# Patient Record
Sex: Female | Born: 1955 | Race: White | Hispanic: No | Marital: Married | Smoking: Former smoker
Health system: Southern US, Community
[De-identification: ages and names within clinical notes are randomized; demographics above are authoritative.]

## PROBLEM LIST (undated history)

## (undated) DIAGNOSIS — F419 Anxiety disorder, unspecified: Secondary | ICD-10-CM

## (undated) DIAGNOSIS — G4730 Sleep apnea, unspecified: Secondary | ICD-10-CM

## (undated) DIAGNOSIS — F329 Major depressive disorder, single episode, unspecified: Secondary | ICD-10-CM

## (undated) DIAGNOSIS — N83209 Unspecified ovarian cyst, unspecified side: Secondary | ICD-10-CM

## (undated) DIAGNOSIS — R079 Chest pain, unspecified: Secondary | ICD-10-CM

## (undated) DIAGNOSIS — I499 Cardiac arrhythmia, unspecified: Secondary | ICD-10-CM

## (undated) DIAGNOSIS — Z87442 Personal history of urinary calculi: Secondary | ICD-10-CM

## (undated) DIAGNOSIS — N200 Calculus of kidney: Secondary | ICD-10-CM

## (undated) DIAGNOSIS — M1990 Unspecified osteoarthritis, unspecified site: Secondary | ICD-10-CM

## (undated) DIAGNOSIS — K5792 Diverticulitis of intestine, part unspecified, without perforation or abscess without bleeding: Secondary | ICD-10-CM

## (undated) DIAGNOSIS — D649 Anemia, unspecified: Secondary | ICD-10-CM

## (undated) DIAGNOSIS — I209 Angina pectoris, unspecified: Secondary | ICD-10-CM

## (undated) DIAGNOSIS — N390 Urinary tract infection, site not specified: Secondary | ICD-10-CM

## (undated) DIAGNOSIS — F32A Depression, unspecified: Secondary | ICD-10-CM

## (undated) DIAGNOSIS — R7303 Prediabetes: Secondary | ICD-10-CM

## (undated) DIAGNOSIS — K219 Gastro-esophageal reflux disease without esophagitis: Secondary | ICD-10-CM

## (undated) DIAGNOSIS — K5790 Diverticulosis of intestine, part unspecified, without perforation or abscess without bleeding: Secondary | ICD-10-CM

## (undated) DIAGNOSIS — Z8719 Personal history of other diseases of the digestive system: Secondary | ICD-10-CM

## (undated) HISTORY — PX: MOUTH SURGERY: SHX715

## (undated) HISTORY — DX: Diverticulosis of intestine, part unspecified, without perforation or abscess without bleeding: K57.90

## (undated) HISTORY — DX: Personal history of other diseases of the digestive system: Z87.19

## (undated) HISTORY — DX: Gastro-esophageal reflux disease without esophagitis: K21.9

## (undated) HISTORY — DX: Chest pain, unspecified: R07.9

## (undated) HISTORY — DX: Calculus of kidney: N20.0

## (undated) HISTORY — PX: COLONOSCOPY: SHX174

## (undated) HISTORY — DX: Diverticulitis of intestine, part unspecified, without perforation or abscess without bleeding: K57.92

## (undated) HISTORY — DX: Anxiety disorder, unspecified: F41.9

## (undated) HISTORY — DX: Major depressive disorder, single episode, unspecified: F32.9

## (undated) HISTORY — DX: Unspecified osteoarthritis, unspecified site: M19.90

## (undated) HISTORY — DX: Depression, unspecified: F32.A

## (undated) HISTORY — DX: Unspecified ovarian cyst, unspecified side: N83.209

## (undated) HISTORY — DX: Prediabetes: R73.03

## (undated) HISTORY — DX: Anemia, unspecified: D64.9

## (undated) HISTORY — DX: Urinary tract infection, site not specified: N39.0

## (undated) HISTORY — PX: CATARACT EXTRACTION, BILATERAL: SHX1313

---

## 1995-05-01 HISTORY — PX: CARPAL TUNNEL RELEASE: SHX101

## 1998-12-20 ENCOUNTER — Ambulatory Visit: Admission: RE | Admit: 1998-12-20 | Discharge: 1998-12-20 | Payer: Self-pay | Admitting: Family Medicine

## 1998-12-20 ENCOUNTER — Encounter: Payer: Self-pay | Admitting: Family Medicine

## 1998-12-23 ENCOUNTER — Ambulatory Visit (HOSPITAL_COMMUNITY): Admission: RE | Admit: 1998-12-23 | Discharge: 1998-12-23 | Payer: Self-pay | Admitting: Internal Medicine

## 1998-12-23 ENCOUNTER — Encounter (INDEPENDENT_AMBULATORY_CARE_PROVIDER_SITE_OTHER): Payer: Self-pay | Admitting: Specialist

## 1999-04-28 ENCOUNTER — Other Ambulatory Visit: Admission: RE | Admit: 1999-04-28 | Discharge: 1999-04-28 | Payer: Self-pay | Admitting: Obstetrics and Gynecology

## 2000-06-21 ENCOUNTER — Ambulatory Visit (HOSPITAL_BASED_OUTPATIENT_CLINIC_OR_DEPARTMENT_OTHER): Admission: RE | Admit: 2000-06-21 | Discharge: 2000-06-21 | Payer: Self-pay | Admitting: Orthopedic Surgery

## 2003-02-23 ENCOUNTER — Ambulatory Visit (HOSPITAL_BASED_OUTPATIENT_CLINIC_OR_DEPARTMENT_OTHER): Admission: RE | Admit: 2003-02-23 | Discharge: 2003-02-23 | Payer: Self-pay

## 2003-06-25 ENCOUNTER — Emergency Department (HOSPITAL_COMMUNITY): Admission: EM | Admit: 2003-06-25 | Discharge: 2003-06-25 | Payer: Self-pay | Admitting: Emergency Medicine

## 2005-08-03 ENCOUNTER — Emergency Department (HOSPITAL_COMMUNITY): Admission: EM | Admit: 2005-08-03 | Discharge: 2005-08-03 | Payer: Self-pay | Admitting: Family Medicine

## 2009-09-07 ENCOUNTER — Ambulatory Visit: Payer: Self-pay | Admitting: Vascular Surgery

## 2009-11-01 ENCOUNTER — Emergency Department (HOSPITAL_COMMUNITY): Admission: EM | Admit: 2009-11-01 | Discharge: 2009-11-02 | Payer: Self-pay | Admitting: Emergency Medicine

## 2010-04-30 HISTORY — PX: TOTAL KNEE ARTHROPLASTY: SHX125

## 2010-05-01 ENCOUNTER — Inpatient Hospital Stay (HOSPITAL_COMMUNITY)
Admission: RE | Admit: 2010-05-01 | Discharge: 2010-05-05 | Payer: Self-pay | Source: Home / Self Care | Attending: Orthopedic Surgery | Admitting: Orthopedic Surgery

## 2010-05-03 LAB — BASIC METABOLIC PANEL
BUN: 4 mg/dL — ABNORMAL LOW (ref 6–23)
CO2: 28 mEq/L (ref 19–32)
Calcium: 8.6 mg/dL (ref 8.4–10.5)
Chloride: 101 mEq/L (ref 96–112)
Creatinine, Ser: 0.73 mg/dL (ref 0.4–1.2)
GFR calc Af Amer: >60 mL/min (ref >60)
GFR calc non Af Amer: >60 mL/min (ref >60)
Glucose, Bld: 128 mg/dL — ABNORMAL HIGH (ref 70–99)
Potassium: 4 mEq/L (ref 3.5–5.1)
Sodium: 134 mEq/L — ABNORMAL LOW (ref 135–145)

## 2010-05-03 LAB — CBC
HCT: 27.5 % — ABNORMAL LOW (ref 36.0–46.0)
Hemoglobin: 8.6 g/dL — ABNORMAL LOW (ref 12.0–15.0)
MCH: 24.5 pg — ABNORMAL LOW (ref 26.0–34.0)
MCHC: 31.3 g/dL (ref 30.0–36.0)
MCV: 78.3 fL (ref 78.0–100.0)
Platelets: 226 10*3/uL (ref 150–400)
RBC: 3.51 MIL/uL — ABNORMAL LOW (ref 3.87–5.11)
RDW: 17.5 % — ABNORMAL HIGH (ref 11.5–15.5)
WBC: 11.7 10*3/uL — ABNORMAL HIGH (ref 4.0–10.5)

## 2010-05-03 LAB — PROTIME-INR
INR: 1.21 (ref 0.00–1.49)
Prothrombin Time: 15.5 seconds — ABNORMAL HIGH (ref 11.6–15.2)

## 2010-05-03 LAB — PREPARE RBC (CROSSMATCH)

## 2010-05-04 LAB — CBC
HCT: 31.6 % — ABNORMAL LOW (ref 36.0–46.0)
Hemoglobin: 10 g/dL — ABNORMAL LOW (ref 12.0–15.0)
MCH: 25.4 pg — ABNORMAL LOW (ref 26.0–34.0)
MCHC: 31.6 g/dL (ref 30.0–36.0)
MCV: 80.4 fL (ref 78.0–100.0)
Platelets: 221 10*3/uL (ref 150–400)
RBC: 3.93 MIL/uL (ref 3.87–5.11)
RDW: 17.3 % — ABNORMAL HIGH (ref 11.5–15.5)
WBC: 10.9 10*3/uL — ABNORMAL HIGH (ref 4.0–10.5)

## 2010-05-04 LAB — BASIC METABOLIC PANEL
BUN: 5 mg/dL — ABNORMAL LOW (ref 6–23)
CO2: 29 mEq/L (ref 19–32)
Calcium: 8.5 mg/dL (ref 8.4–10.5)
Chloride: 98 mEq/L (ref 96–112)
Creatinine, Ser: 0.7 mg/dL (ref 0.4–1.2)
GFR calc Af Amer: >60 mL/min (ref >60)
GFR calc non Af Amer: >60 mL/min (ref >60)
Glucose, Bld: 117 mg/dL — ABNORMAL HIGH (ref 70–99)
Potassium: 3.7 mEq/L (ref 3.5–5.1)
Sodium: 133 mEq/L — ABNORMAL LOW (ref 135–145)

## 2010-05-04 LAB — PROTIME-INR
INR: 1.23 (ref 0.00–1.49)
Prothrombin Time: 15.7 seconds — ABNORMAL HIGH (ref 11.6–15.2)

## 2010-05-05 LAB — CBC
HCT: 31.2 % — ABNORMAL LOW (ref 36.0–46.0)
Hemoglobin: 9.7 g/dL — ABNORMAL LOW (ref 12.0–15.0)
MCH: 25.3 pg — ABNORMAL LOW (ref 26.0–34.0)
MCHC: 31.1 g/dL (ref 30.0–36.0)
MCV: 81.3 fL (ref 78.0–100.0)
Platelets: 245 10*3/uL (ref 150–400)
RBC: 3.84 MIL/uL — ABNORMAL LOW (ref 3.87–5.11)
RDW: 17.8 % — ABNORMAL HIGH (ref 11.5–15.5)
WBC: 8.2 10*3/uL (ref 4.0–10.5)

## 2010-05-05 LAB — BASIC METABOLIC PANEL
BUN: 6 mg/dL (ref 6–23)
CO2: 30 mEq/L (ref 19–32)
Calcium: 8.4 mg/dL (ref 8.4–10.5)
Chloride: 98 mEq/L (ref 96–112)
Creatinine, Ser: 0.63 mg/dL (ref 0.4–1.2)
GFR calc Af Amer: >60 mL/min (ref >60)
GFR calc non Af Amer: >60 mL/min (ref >60)
Glucose, Bld: 122 mg/dL — ABNORMAL HIGH (ref 70–99)
Potassium: 3.7 mEq/L (ref 3.5–5.1)
Sodium: 135 mEq/L (ref 135–145)

## 2010-05-05 LAB — PROTIME-INR
INR: 1.36 (ref 0.00–1.49)
Prothrombin Time: 17 seconds — ABNORMAL HIGH (ref 11.6–15.2)

## 2010-07-10 LAB — CBC
HCT: 36.5 % (ref 36.0–46.0)
Hemoglobin: 11 g/dL — ABNORMAL LOW (ref 12.0–15.0)
Hemoglobin: 9.1 g/dL — ABNORMAL LOW (ref 12.0–15.0)
MCH: 24 pg — ABNORMAL LOW (ref 26.0–34.0)
MCHC: 30.1 g/dL (ref 30.0–36.0)
MCV: 79.5 fL (ref 78.0–100.0)
Platelets: 318 10*3/uL (ref 150–400)
RBC: 3.81 MIL/uL — ABNORMAL LOW (ref 3.87–5.11)
RBC: 4.59 MIL/uL (ref 3.87–5.11)
RDW: 18.2 % — ABNORMAL HIGH (ref 11.5–15.5)
WBC: 7.5 10*3/uL (ref 4.0–10.5)
WBC: 7.7 10*3/uL (ref 4.0–10.5)

## 2010-07-10 LAB — COMPREHENSIVE METABOLIC PANEL
ALT: 13 U/L (ref 0–35)
AST: 16 U/L (ref 0–37)
Albumin: 3.4 g/dL — ABNORMAL LOW (ref 3.5–5.2)
Alkaline Phosphatase: 109 U/L (ref 39–117)
BUN: 16 mg/dL (ref 6–23)
CO2: 24 mEq/L (ref 19–32)
Calcium: 9.1 mg/dL (ref 8.4–10.5)
Chloride: 104 mEq/L (ref 96–112)
Creatinine, Ser: 0.66 mg/dL (ref 0.4–1.2)
GFR calc Af Amer: >60 mL/min (ref >60)
GFR calc non Af Amer: >60 mL/min (ref >60)
Glucose, Bld: 106 mg/dL — ABNORMAL HIGH (ref 70–99)
Potassium: 3.8 mEq/L (ref 3.5–5.1)
Sodium: 137 mEq/L (ref 135–145)
Total Bilirubin: 0.4 mg/dL (ref 0.3–1.2)
Total Protein: 6.7 g/dL (ref 6.0–8.3)

## 2010-07-10 LAB — TYPE AND SCREEN
ABO/RH(D): O POS
Antibody Screen: NEGATIVE
Unit division: 0
Unit division: 0

## 2010-07-10 LAB — BASIC METABOLIC PANEL
BUN: 9 mg/dL (ref 6–23)
CO2: 27 mEq/L (ref 19–32)
Calcium: 8.6 mg/dL (ref 8.4–10.5)
Chloride: 100 mEq/L (ref 96–112)
Creatinine, Ser: 0.78 mg/dL (ref 0.4–1.2)
GFR calc Af Amer: >60 mL/min (ref >60)
GFR calc non Af Amer: >60 mL/min (ref >60)
Glucose, Bld: 125 mg/dL — ABNORMAL HIGH (ref 70–99)
Potassium: 4 mEq/L (ref 3.5–5.1)
Sodium: 135 mEq/L (ref 135–145)

## 2010-07-10 LAB — URINALYSIS, ROUTINE W REFLEX MICROSCOPIC
Bilirubin Urine: NEGATIVE
Glucose, UA: NEGATIVE mg/dL
Hgb urine dipstick: NEGATIVE
Ketones, ur: NEGATIVE mg/dL
Nitrite: NEGATIVE
Protein, ur: NEGATIVE mg/dL
Specific Gravity, Urine: 1.022 (ref 1.005–1.030)
Urobilinogen, UA: 0.2 mg/dL (ref 0.0–1.0)
pH: 6 (ref 5.0–8.0)

## 2010-07-10 LAB — APTT: aPTT: 33 seconds (ref 24–37)

## 2010-07-10 LAB — PROTIME-INR
INR: 0.9 (ref 0.00–1.49)
INR: 0.99 (ref 0.00–1.49)
Prothrombin Time: 12.4 seconds (ref 11.6–15.2)
Prothrombin Time: 13.3 seconds (ref 11.6–15.2)

## 2010-07-10 LAB — ABO/RH: ABO/RH(D): O POS

## 2010-07-10 LAB — SURGICAL PCR SCREEN
MRSA, PCR: NEGATIVE
Staphylococcus aureus: POSITIVE — AB

## 2010-07-10 LAB — PREGNANCY, URINE: Preg Test, Ur: NEGATIVE

## 2010-07-16 LAB — COMPREHENSIVE METABOLIC PANEL
ALT: 15 U/L (ref 0–35)
AST: 19 U/L (ref 0–37)
CO2: 28 mEq/L (ref 19–32)
Calcium: 9.3 mg/dL (ref 8.4–10.5)
Chloride: 106 mEq/L (ref 96–112)
GFR calc Af Amer: >60 mL/min (ref >60)
GFR calc non Af Amer: >60 mL/min (ref >60)
Sodium: 139 mEq/L (ref 135–145)

## 2010-07-16 LAB — URINE MICROSCOPIC-ADD ON

## 2010-07-16 LAB — D-DIMER, QUANTITATIVE: D-Dimer, Quant: 0.32 ug/mL-FEU (ref 0.00–0.48)

## 2010-07-16 LAB — URINALYSIS, ROUTINE W REFLEX MICROSCOPIC
Bilirubin Urine: NEGATIVE
Glucose, UA: NEGATIVE mg/dL
Hgb urine dipstick: NEGATIVE
Ketones, ur: NEGATIVE mg/dL
Nitrite: NEGATIVE
pH: 6 (ref 5.0–8.0)

## 2010-07-16 LAB — CBC
Hemoglobin: 10.5 g/dL — ABNORMAL LOW (ref 12.0–15.0)
MCHC: 32.1 g/dL (ref 30.0–36.0)
RBC: 4.3 MIL/uL (ref 3.87–5.11)

## 2010-07-16 LAB — POCT CARDIAC MARKERS: Myoglobin, poc: 54.9 ng/mL (ref 12–200)

## 2010-07-16 LAB — DIFFERENTIAL
Eosinophils Absolute: 0.2 10*3/uL (ref 0.0–0.7)
Eosinophils Relative: 3 % (ref 0–5)
Lymphs Abs: 1.9 10*3/uL (ref 0.7–4.0)
Monocytes Absolute: 0.7 10*3/uL (ref 0.1–1.0)

## 2010-09-12 NOTE — Procedures (Signed)
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   EXAM:  Using color-flow imaging and pulse Doppler spectral analysis, the  right common femoral, superficial femoral, popliteal, posterior tibial,  greater and lesser saphenous veins are evaluated.  There is no evidence  suggesting deep venous insufficiency in the right lower extremity.   The right saphenofemoral junction is competent. The right calf GSV is  not competent with Reflux of >585milliseconds with the caliber as  described below.   The right proximal short saphenous vein demonstrates competency.   GSV Diameter (used if found to be incompetent only)                                            Right    Left  Proximal Greater Saphenous Vein           0.53 cm  cm  Proximal-to-mid-thigh                     cm       cm  Mid thigh                                 0.46 cm  cm  Mid-distal thigh                          cm       cm  Distal thigh                              0.42 cm  cm  Knee                                      0.52 cm  cm   IMPRESSION:  1. Right calf greater saphenous vein Reflux with >523milliseconds is      identified with the caliber ranging from 0.42 cm to 0.53 cm knee to      groin.  2. The right greater saphenous vein is not aneurysmal.  3. The right greater saphenous vein is not tortuous.  4. The deep venous system is competent.  5. The right short saphenous vein is competent.          ___________________________________________  Larina Earthly, M.D.   AS/MEDQ  D:  09/07/2009  T:  09/07/2009  Job:  [id number]

## 2010-09-12 NOTE — Consult Note (Signed)
NEW PATIENT CONSULTATION   Jasmine [name], Jasmine [name]  DOB:  May 22, 1955                                       09/07/2009  VHQIO#:[id number]   The patient presents today for evaluation of right leg venous pathology.  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  She does report some  mild aching with these but this is not bothering her to a great degree.  She has been tolerant of this for many years.  She does have no  significant swelling or edema, no history of thrombophlebitis or  bleeding.  She was concerned that these may be of more serious  consequences and was seen for evaluation today.   PAST MEDICAL HISTORY:  Her past history is negative for any major  medical difficulties.  She has no cardiac, pulmonary, GI or GU  dysfunction.   FAMILY HISTORY:  Negative for premature atherosclerotic disease.   SOCIAL HISTORY:  She is married with two children.  She is employed.  She does not smoke having quit 20 years ago.  She does not drink  alcohol.   REVIEW OF SYSTEMS:  Review of systems is in her chart and negative.   PHYSICAL EXAMINATION:  Vital signs:  Her weight is 220 pounds.  She is 5  feet 7 inches tall.  General:  A well-developed, well-nourished white  female appearing stated age, in no acute distress.  HEENT:  Normal.  Her  radial and dorsalis pedis pulses are 2+ bilaterally.  Musculoskeletal:  Shows no major deformity or cyanosis.  Neurological:  No focal weakness  or paresthesias.  Skin:  Without ulcers or rashes.  She does have marked  telangiectasia over both thighs.  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   She underwent noninvasive vascular laboratory studies in our office and  this reveals no reflux in her great saphenous vein throughout her thigh.  She does have some mild reflux in the mid calf.  These do T into the  tributary varicosities over her  medial calf.  I discussed this at length  with the patient.  I do not feel she has any evidence of severe venous  hypertension.  Since she is not having any significant pain associated  with this currently she is comfortable with reassurance only.  I did  explain the potential option for sclerotherapy or stab phlebectomy of  these reticular veins.  She will continue observation only and notify us  should she wish treatment.     Larina Earthly, M.D.  Electronically Signed   TFE/MEDQ  D:  09/07/2009  T:  09/08/2009  Job:  [id number]   cc:   Clydie Braun L. Hal Hope, M.D.

## 2011-02-14 ENCOUNTER — Encounter: Payer: Self-pay | Admitting: Internal Medicine

## 2011-02-28 ENCOUNTER — Ambulatory Visit (AMBULATORY_SURGERY_CENTER): Payer: BC Managed Care – PPO | Admitting: *Deleted

## 2011-02-28 ENCOUNTER — Encounter: Payer: Self-pay | Admitting: Internal Medicine

## 2011-02-28 VITALS — Ht 67.0 in | Wt 220.0 lb

## 2011-02-28 DIAGNOSIS — Z1211 Encounter for screening for malignant neoplasm of colon: Secondary | ICD-10-CM

## 2011-02-28 MED ORDER — MOVIPREP 100 G PO SOLR: ORAL | Status: DC

## 2011-03-14 ENCOUNTER — Ambulatory Visit (AMBULATORY_SURGERY_CENTER): Payer: BC Managed Care – PPO | Admitting: Internal Medicine

## 2011-03-14 ENCOUNTER — Encounter: Payer: Self-pay | Admitting: Internal Medicine

## 2011-03-14 VITALS — BP 146/102 | HR 92 | Temp 98.6°F | Resp 18 | Ht 67.0 in | Wt 220.0 lb

## 2011-03-14 DIAGNOSIS — Z1211 Encounter for screening for malignant neoplasm of colon: Secondary | ICD-10-CM

## 2011-03-14 MED ORDER — SODIUM CHLORIDE 0.9 % IV SOLN: 500.0000 mL | INTRAVENOUS | Status: DC

## 2011-03-14 NOTE — Patient Instructions (Signed)
Metamucil 1 tsp daily. Resume all medications. Information on diverticulosis and high fiber diet given. D/C Instructions completed.

## 2011-03-15 ENCOUNTER — Telehealth: Payer: Self-pay | Admitting: *Deleted

## 2011-03-15 NOTE — Telephone Encounter (Signed)
NO ANSWER, MESSAGE LEFT FOR PATIENT AT NUMBER PROVIDED IN ADMITTING.

## 2011-04-19 ENCOUNTER — Ambulatory Visit: Payer: Self-pay | Admitting: Family Medicine

## 2011-07-30 ENCOUNTER — Ambulatory Visit (INDEPENDENT_AMBULATORY_CARE_PROVIDER_SITE_OTHER): Payer: BC Managed Care – PPO | Admitting: Family Medicine

## 2011-07-30 VITALS — BP 128/74 | HR 107 | Temp 98.3°F | Resp 18 | Ht 67.0 in | Wt 214.2 lb

## 2011-07-30 DIAGNOSIS — K1370 Unspecified lesions of oral mucosa: Secondary | ICD-10-CM

## 2011-07-30 DIAGNOSIS — R350 Frequency of micturition: Secondary | ICD-10-CM

## 2011-07-30 LAB — POCT UA - MICROSCOPIC ONLY: Mucus, UA: NEGATIVE

## 2011-07-30 LAB — POCT URINALYSIS DIPSTICK
Bilirubin, UA: NEGATIVE
Glucose, UA: NEGATIVE
Ketones, UA: NEGATIVE
Leukocytes, UA: NEGATIVE
Spec Grav, UA: 1.02

## 2011-07-30 MED ORDER — VALACYCLOVIR HCL 1 G PO TABS: ORAL_TABLET | ORAL | Status: DC

## 2011-07-30 NOTE — Progress Notes (Signed)
Subjective:    Patient ID: Jasmine [name], female    DOB: 17-Jun-1955, 56 y.o.   MRN: [id number]  HPI  Jasmine [name] is a 56 y.o. female Hx of UTI's in past.    Today c/o unusual odor from urine - noticed few weeks.  No dysuria, but has had frequency.  No hematuria, no urgency.  No fever, N/V, or abd pain. No tx.  Blister on upper lip for past 2 days.  Warm sensation today, no pain/itching.  No hx cold sores, Married, but spouse without hx of cold sores. -   Review of Systems  Constitutional: Negative for fever and chills.  Gastrointestinal: Negative for abdominal pain.  Genitourinary: Positive for frequency. Negative for dysuria, hematuria, flank pain and difficulty urinating.  Musculoskeletal: Negative for back pain.  Skin: Positive for rash.       Objective:   Physical Exam  Constitutional: She is oriented to person, place, and time. She appears well-developed and well-nourished.  HENT:  Head: Normocephalic and atraumatic.  Right Ear: External ear normal.  Left Ear: External ear normal.  Mouth/Throat: Oropharynx is clear and moist.    Eyes: Conjunctivae are normal. Pupils are equal, round, and reactive to light.  Cardiovascular: Normal rate, regular rhythm, normal heart sounds and intact distal pulses.   Pulmonary/Chest: Effort normal.  Abdominal: Soft. Bowel sounds are normal. There is no tenderness.  Lymphadenopathy:    She has no cervical adenopathy.  Neurological: She is alert and oriented to person, place, and time.  Skin: Skin is warm and dry.       See mouth diagram.  Psychiatric: She has a normal mood and affect. Her behavior is normal. Judgment and thought content normal.    Results for orders placed in visit on 07/30/11  POCT URINALYSIS DIPSTICK      Component Value Range   Color, UA yellow     Clarity, UA clear     Glucose, UA neg     Bilirubin, UA neg     Ketones, UA neg     Spec Grav, UA 1.020     Blood, UA neg     pH, UA 7.0     Protein, UA neg       Urobilinogen, UA 0.2     Nitrite, UA neg     Leukocytes, UA Negative    POCT UA - MICROSCOPIC ONLY      Component Value Range   WBC, Ur, HPF, POC 0-1     RBC, urine, microscopic 0-3     Bacteria, U Microscopic 2+     Mucus, UA neg     Epithelial cells, urine per micros 0-1     Crystals, Ur, HPF, POC neg     Casts, Ur, LPF, POC neg     Yeast, UA neg           Assessment & Plan:  Jasmine [name] is a 56 y.o. female 1. Lesion of mouth  Herpes simplex virus culture, HSV(herpes simplex vrs) 1+2 ab-IgG  2. Urinary frequency  POCT urinalysis dipstick, POCT UA - Microscopic Only   Urinary frequency/odor - no apparent uti on U/A or micro. Patient had to leave for other appt prior to result - called with above results - will check urine culture, but if odor persists, would recommend wet prep to rule out BV or other cause of odor in urine. Understanding expressed.  Lesion - mouth.  Suspicious for HSV - 2 days duration, will start  Valtrex 1 gram - 2 po now, then repeat in 12 hours.  #4.  Check HSV titer, and viral cx obtained.  RTC if any increase in size or erythema or edema.  Return to the clinic or go to the nearest emergency room if any of your symptoms worsen or new symptoms occur.   Cell: [phone]

## 2011-08-01 LAB — URINE CULTURE

## 2011-09-20 ENCOUNTER — Ambulatory Visit (INDEPENDENT_AMBULATORY_CARE_PROVIDER_SITE_OTHER): Payer: BC Managed Care – PPO | Admitting: Family Medicine

## 2011-09-20 DIAGNOSIS — R079 Chest pain, unspecified: Secondary | ICD-10-CM

## 2011-09-20 DIAGNOSIS — R5381 Other malaise: Secondary | ICD-10-CM

## 2011-09-20 DIAGNOSIS — R5383 Other fatigue: Secondary | ICD-10-CM

## 2011-09-20 LAB — POCT CBC
Granulocyte percent: 72.2 %G (ref 37–80)
HCT, POC: 43 % (ref 37.7–47.9)
MCH, POC: 28.2 pg (ref 27–31.2)
MCV: 89.8 fL (ref 80–97)
MID (cbc): 0.4 (ref 0–0.9)
POC LYMPH PERCENT: 21.6 %L (ref 10–50)
Platelet Count, POC: 299 10*3/uL (ref 142–424)
RBC: 4.79 M/uL (ref 4.04–5.48)
WBC: 5.9 10*3/uL (ref 4.6–10.2)

## 2011-09-20 NOTE — Progress Notes (Signed)
Subjective:    Patient ID: Jasmine [name], female    DOB: 09/23/1955, 56 y.o.   MRN: [id number]  HPI Jasmine [name] is a 56 y.o. female Hx of depression/anxiety.    Noticed chest pain this morning  - dropped cat off at the vet this am (possibly liver issue).  Felt center chest pain for 6 or 7 minutes, pressure sensation.  Felt a little bit of acid feeling in back of throat - briefly. Pain Improved in 6-7 minutes.  No radiation. Feels tired today - only 6 hours sleep last night. Tx: asa 325 at 8:50am. No pain currently.  Denies being more stressed or anxious this am when the sx's occurred.  1 cup coffee his am.   No previous heart disease. Father with MI at 39yo, no early FH CAD.   No recent prolonged car travel or air travel, no recent calf pain or swelling. Hx of Genella Rife in past - no recent symptoms or meds for this.  No n/v, no sweating, maybe shortness of breath - may have been trying to take deep breath - no difficulty breathing.   Tobacco - 1/2 ppd.  Review of Systems     Objective:   Physical Exam  Constitutional: She is oriented to person, place, and time. She appears well-developed and well-nourished. No distress.  HENT:  Head: Normocephalic and atraumatic.  Cardiovascular: Normal rate, regular rhythm, normal heart sounds and intact distal pulses.   No murmur heard. Pulmonary/Chest: Effort normal and breath sounds normal.       Chest wall nontender  Abdominal: Soft. Bowel sounds are normal. There is no tenderness.  Neurological: She is alert and oriented to person, place, and time.  Skin: Skin is warm and dry. She is not diaphoretic.  Psychiatric: She has a normal mood and affect. Her behavior is normal.    EKG: NSR, no acute findings. Results for orders placed in visit on 09/20/11  POCT CBC      Component Value Range   WBC 5.9  4.6 - 10.2 (K/uL)   Lymph, poc 1.3  0.6 - 3.4    POC LYMPH PERCENT 21.6  10 - 50 (%L)   MID (cbc) 0.4  0 - 0.9    POC MID % 6.2  0 - 12 (%M)   POC Granulocyte 4.3  2 - 6.9    Granulocyte percent 72.2  37 - 80 (%G)   RBC 4.79  4.04 - 5.48 (M/uL)   Hemoglobin 13.5  12.2 - 16.2 (g/dL)   HCT, POC 56.2  13.0 - 47.9 (%)   MCV 89.8  80 - 97 (fL)   MCH, POC 28.2  27 - 31.2 (pg)   MCHC 31.4 (*) 31.8 - 35.4 (g/dL)   RDW, POC 86.5     Platelet Count, POC 299  142 - 424 (K/uL)   MPV 8.7  0 - 99.8 (fL)      Assessment & Plan:  CELITA ARON is a 56 y.o. female Brief episode of chest pain this am.  Now resolved.  Reassuring EKG, fatigue likely sleep related as CBC reassuring.  DDX atypical CP, GERD, msk. CAD risk factor of tobacco abuse only at this point. Start prilosec otc.  Recheck for fasting lipids in next few weeks.   Can continue asa -81 or 325mg  qd. Discussed cardiology eval - patient declined at this point.  Borderline HTN - discussed low dose beta blocker, but declined at present.  Will check home BP readings and  if remains elevated - call back to start med.  If chest pain recurs - 911/ER evaluation - understanding expressed.

## 2011-09-20 NOTE — Patient Instructions (Signed)
Start prilosec over the counter.Recheck for fasting cholesterol in next few weeks.Can continue aspirin - 1 per day. Check home BP readings and if remains elevated - call back to consider starting low dose medicine. If chest pain recurs - 911/ER evaluation  Return to the clinic or go to the nearest emergency room if any of your symptoms worsen or new symptoms occur.  Chest Pain (Nonspecific) It is often hard to give a specific diagnosis for the cause of chest pain. There is always a chance that your pain could be related to something serious, such as a heart attack or a blood clot in the lungs. You need to follow up with your caregiver for further evaluation. CAUSES   Heartburn.   Pneumonia or bronchitis.   Anxiety or stress.   Inflammation around your heart (pericarditis) or lung (pleuritis or pleurisy).   A blood clot in the lung.   A collapsed lung (pneumothorax). It can develop suddenly on its own (spontaneous pneumothorax) or from injury (trauma) to the chest.   Shingles infection (herpes zoster virus).  The chest wall is composed of bones, muscles, and cartilage. Any of these can be the source of the pain.  The bones can be bruised by injury.   The muscles or cartilage can be strained by coughing or overwork.   The cartilage can be affected by inflammation and become sore (costochondritis).  DIAGNOSIS  Lab tests or other studies, such as X-rays, electrocardiography, stress testing, or cardiac imaging, may be needed to find the cause of your pain.  TREATMENT   Treatment depends on what may be causing your chest pain. Treatment may include:   Acid blockers for heartburn.   Anti-inflammatory medicine.   Pain medicine for inflammatory conditions.   Antibiotics if an infection is present.   You may be advised to change lifestyle habits. This includes stopping smoking and avoiding alcohol, caffeine, and chocolate.   You may be advised to keep your head raised (elevated)  when sleeping. This reduces the chance of acid going backward from your stomach into your esophagus.   Most of the time, nonspecific chest pain will improve within 2 to 3 days with rest and mild pain medicine.  HOME CARE INSTRUCTIONS   If antibiotics were prescribed, take your antibiotics as directed. Finish them even if you start to feel better.   For the next few days, avoid physical activities that bring on chest pain. Continue physical activities as directed.   Do not smoke.   Avoid drinking alcohol.   Only take over-the-counter or prescription medicine for pain, discomfort, or fever as directed by your caregiver.   Follow your caregiver's suggestions for further testing if your chest pain does not go away.   Keep any follow-up appointments you made. If you do not go to an appointment, you could develop lasting (chronic) problems with pain. If there is any problem keeping an appointment, you must call to reschedule.  SEEK MEDICAL CARE IF:   You think you are having problems from the medicine you are taking. Read your medicine instructions carefully.   Your chest pain does not go away, even after treatment.   You develop a rash with blisters on your chest.  SEEK IMMEDIATE MEDICAL CARE IF:   You have increased chest pain or pain that spreads to your arm, neck, jaw, back, or abdomen.   You develop shortness of breath, an increasing cough, or you are coughing up blood.   You have severe back or abdominal  pain, feel nauseous, or vomit.   You develop severe weakness, fainting, or chills.   You have a fever.  THIS IS AN EMERGENCY. Do not wait to see if the pain will go away. Get medical help at once. Call your local emergency services (911 in [place] Do not drive yourself to the hospital. MAKE SURE YOU:   Understand these instructions.   Will watch your condition.   Will get help right away if you are not doing well or get worse.  Document Released: 01/24/2005 Document  Revised: 04/05/2011 Document Reviewed: 11/20/2007 [facility] Patient Information 2012 [place], [place].

## 2011-09-28 ENCOUNTER — Encounter: Payer: Self-pay | Admitting: Family Medicine

## 2011-09-28 ENCOUNTER — Ambulatory Visit (INDEPENDENT_AMBULATORY_CARE_PROVIDER_SITE_OTHER): Payer: BC Managed Care – PPO | Admitting: Family Medicine

## 2011-09-28 VITALS — BP 121/75 | HR 101 | Temp 98.3°F | Resp 20 | Ht 66.5 in | Wt 211.4 lb

## 2011-09-28 DIAGNOSIS — B078 Other viral warts: Secondary | ICD-10-CM

## 2011-09-28 DIAGNOSIS — G4733 Obstructive sleep apnea (adult) (pediatric): Secondary | ICD-10-CM

## 2011-09-28 DIAGNOSIS — R079 Chest pain, unspecified: Secondary | ICD-10-CM

## 2011-09-28 DIAGNOSIS — B079 Viral wart, unspecified: Secondary | ICD-10-CM

## 2011-09-28 LAB — COMPREHENSIVE METABOLIC PANEL
ALT: 11 U/L (ref 0–35)
AST: 13 U/L (ref 0–37)
Albumin: 4.1 g/dL (ref 3.5–5.2)
Alkaline Phosphatase: 114 U/L (ref 39–117)
Glucose, Bld: 100 mg/dL — ABNORMAL HIGH (ref 70–99)
Potassium: 4 mEq/L (ref 3.5–5.3)
Sodium: 140 mEq/L (ref 135–145)
Total Protein: 6.7 g/dL (ref 6.0–8.3)

## 2011-09-28 LAB — LIPID PANEL
LDL Cholesterol: 107 mg/dL — ABNORMAL HIGH (ref 0–99)
Triglycerides: 115 mg/dL (ref <150)

## 2011-09-28 NOTE — Patient Instructions (Addendum)
Your should receive a call or letter about your lab results within the next week to 10 days, and a phone call about scheduling the sleep study. If any signs of infection at frozen area, return to clinic.   If chest pain returns - return to clinic, emergency room, or call 911.

## 2011-09-28 NOTE — Progress Notes (Signed)
  Subjective:    Patient ID: Patsi Sears, female    DOB: November 14, 1955, 56 y.o.   MRN: [id number]  HPI MONCIA ANNAS is a 56 y.o. female Here for follow up on multiple concerns. Chest pain - see 09/10/11 office visit: Brief episode of chest pain that am,  Resolved in office.   Reassuring EKG, fatigue likely sleep related as CBC reassuring.  DDX atypical CP, GERD, msk. CAD risk factor of tobacco abuse only at this point.Started prilosec otc. Recheck for fasting lipids planned. Continued asa. Discussed cardiology eval - patient declined at that ov.  Borderline HTN - discussed low dose beta blocker, but declined at that time.plan to check home BP readings. No further chest pains.  Has not checked home BP's.  Ended up not having to take prilosec, as no further sx's.  Feeling great. Had fasting blood drawn this am.   Wart on top of R hand - noticed this week.  No treatments.   Insomnia - erratic - waking up at times - on and off for 1 month. Not having flushes at the time. No sweating.  No trouble getting to sleep.  No heartburn.  Dinner at 7 or 8pm, bedtime 12-2am.  Denies anxiety sx's, denies choking or shortness of breath, but has snoring for years - atleast 10 years.  No known pauses.  Last sleep study  10 years ago - had sleep apnea, but unable to tolerate CPAP.      Review of Systems  Respiratory: Negative for chest tightness and shortness of breath.   Cardiovascular: Negative for chest pain.  Gastrointestinal:       Denies heartburn.  Skin:       Wart  r hand.  Psychiatric/Behavioral: Positive for sleep disturbance.       Objective:   Physical Exam  Constitutional: She is oriented to person, place, and time. She appears well-developed and well-nourished. No distress.  HENT:  Head: Normocephalic and atraumatic.  Eyes: Conjunctivae and EOM are normal. Pupils are equal, round, and reactive to light.  Cardiovascular: Normal rate, regular rhythm, normal heart sounds and intact distal  pulses.   No murmur heard. Pulmonary/Chest: Effort normal and breath sounds normal.  Neurological: She is alert and oriented to person, place, and time.  Skin: Skin is warm and dry.     Psychiatric: She has a normal mood and affect. Her behavior is normal.   Risks (including but not limited to infection and scarring), benefits, and alternatives discussed for cryotherapy to R hand common wart.  Verbal consent obtained after any questions were answered. Freeze/thaw cycle x3 with liquid nitrogen sprayed through ear speculum for focal delivery. No complications.   RTC precautions discussed in regards to infection.  If not completely resolved in few weeks, can retreat or try otc tx 1st.understanding expressed.       Assessment & Plan:  KAMARIYA BLEVENS is a 56 y.o. female  Chest pain - resolved, no further recurence since last ov.  If recurs again, consider cardiology eval. ER/911 chest pain precautions reviewed - understanding expressed.  check lipids for further risk stratification. Current cardiac risk factors - age, tobacco abuse.   Insomnia with snoring.  Hx of OSA - reschedule sleep study - split study to see if can tolerate newer cpap options. Can also try prilosec for 1 week in case there is a component of GERD.   R hand wart. Cryo x 3 as above.

## 2012-02-19 ENCOUNTER — Ambulatory Visit (INDEPENDENT_AMBULATORY_CARE_PROVIDER_SITE_OTHER): Payer: BC Managed Care – PPO | Admitting: *Deleted

## 2012-02-19 DIAGNOSIS — Z23 Encounter for immunization: Secondary | ICD-10-CM

## 2012-02-27 ENCOUNTER — Ambulatory Visit (INDEPENDENT_AMBULATORY_CARE_PROVIDER_SITE_OTHER): Payer: BC Managed Care – PPO | Admitting: Family Medicine

## 2012-02-27 VITALS — BP 131/68 | HR 59 | Temp 98.5°F | Resp 16 | Ht 67.0 in | Wt 221.0 lb

## 2012-02-27 DIAGNOSIS — M545 Low back pain, unspecified: Secondary | ICD-10-CM

## 2012-02-27 LAB — POCT URINALYSIS DIPSTICK
Bilirubin, UA: NEGATIVE
Glucose, UA: NEGATIVE
Nitrite, UA: NEGATIVE

## 2012-02-27 LAB — POCT UA - MICROSCOPIC ONLY: Bacteria, U Microscopic: NEGATIVE

## 2012-02-27 MED ORDER — PREDNISONE 20 MG PO TABS: ORAL_TABLET | ORAL | Status: DC

## 2012-02-27 NOTE — Progress Notes (Signed)
56 yo woman with left low back pain and flank pain for 2 weeks.  She thinks she may have lifted a box of books "wrong."  There is tenderness over the area, and pain is worsened by movement.  Objective:   Tender left flank,  SLR normal No muscle weakness Results for orders placed in visit on 02/27/12  POCT URINALYSIS DIPSTICK      Component Value Range   Color, UA yellow     Clarity, UA cloudy     Glucose, UA neg     Bilirubin, UA neg     Ketones, UA neg     Spec Grav, UA 1.020     Blood, UA trace     pH, UA 6.5     Protein, UA neg     Urobilinogen, UA 0.2     Nitrite, UA neg     Leukocytes, UA Trace    POCT UA - MICROSCOPIC ONLY      Component Value Range   WBC, Ur, HPF, POC 0-3     RBC, urine, microscopic 0-1     Bacteria, U Microscopic neg     Mucus, UA neg     Epithelial cells, urine per micros 0-2     Crystals, Ur, HPF, POC neg     Casts, Ur, LPF, POC neg     Yeast, UA neg       Assessment: back strain  Plan:   1. Low back pain  POCT urinalysis dipstick, POCT UA - Microscopic Only, predniSONE (DELTASONE) 20 MG tablet

## 2012-07-28 ENCOUNTER — Ambulatory Visit: Payer: BC Managed Care – PPO

## 2012-07-28 ENCOUNTER — Ambulatory Visit
Admission: RE | Admit: 2012-07-28 | Discharge: 2012-07-28 | Disposition: A | Discharge status: Home / Self Care | Payer: BC Managed Care – PPO | Source: Ambulatory Visit | Attending: Family Medicine | Admitting: Family Medicine

## 2012-07-28 ENCOUNTER — Ambulatory Visit (INDEPENDENT_AMBULATORY_CARE_PROVIDER_SITE_OTHER): Payer: BC Managed Care – PPO | Admitting: Family Medicine

## 2012-07-28 VITALS — BP 156/84 | HR 105 | Temp 98.2°F | Resp 17 | Ht 67.0 in | Wt 222.0 lb

## 2012-07-28 DIAGNOSIS — K5732 Diverticulitis of large intestine without perforation or abscess without bleeding: Secondary | ICD-10-CM

## 2012-07-28 DIAGNOSIS — R1032 Left lower quadrant pain: Secondary | ICD-10-CM

## 2012-07-28 DIAGNOSIS — F4323 Adjustment disorder with mixed anxiety and depressed mood: Secondary | ICD-10-CM

## 2012-07-28 LAB — POCT URINALYSIS DIPSTICK
Bilirubin, UA: NEGATIVE
Ketones, UA: NEGATIVE
Spec Grav, UA: 1.02
pH, UA: 6.5

## 2012-07-28 LAB — POCT CBC
Lymph, poc: 1.9 (ref 0.6–3.4)
MCH, POC: 27.6 pg (ref 27–31.2)
MCHC: 31.1 g/dL — AB (ref 31.8–35.4)
MPV: 8.5 fL (ref 0–99.8)
POC Granulocyte: 7.8 — AB (ref 2–6.9)
POC LYMPH PERCENT: 18.5 %L (ref 10–50)
POC MID %: 4 %M (ref 0–12)
RDW, POC: 15.8 %
WBC: 10.1 10*3/uL (ref 4.6–10.2)

## 2012-07-28 LAB — POCT UA - MICROSCOPIC ONLY: Crystals, Ur, HPF, POC: NEGATIVE

## 2012-07-28 LAB — COMPREHENSIVE METABOLIC PANEL
Alkaline Phosphatase: 143 U/L — ABNORMAL HIGH (ref 39–117)
Creat: 0.85 mg/dL (ref 0.50–1.10)
Glucose, Bld: 100 mg/dL — ABNORMAL HIGH (ref 70–99)
Sodium: 139 mEq/L (ref 135–145)
Total Bilirubin: 0.4 mg/dL (ref 0.3–1.2)
Total Protein: 7.2 g/dL (ref 6.0–8.3)

## 2012-07-28 LAB — LIPASE: Lipase: 13 U/L (ref 0–75)

## 2012-07-28 MED ORDER — CIPROFLOXACIN HCL 750 MG PO TABS: 750.0000 mg | ORAL_TABLET | Freq: Two times a day (BID) | ORAL | Status: DC

## 2012-07-28 MED ORDER — IOHEXOL 300 MG/ML  SOLN
125.0000 mL | Freq: Once | INTRAMUSCULAR | Status: AC | PRN
  Administered 2012-07-28: 125 mL via INTRAVENOUS

## 2012-07-28 MED ORDER — IOHEXOL 300 MG/ML  SOLN
40.0000 mL | Freq: Once | INTRAMUSCULAR | Status: AC | PRN
  Administered 2012-07-28: 40 mL via ORAL

## 2012-07-28 MED ORDER — METRONIDAZOLE 500 MG PO TABS: 500.0000 mg | ORAL_TABLET | Freq: Four times a day (QID) | ORAL | Status: DC

## 2012-07-28 MED ORDER — AMOXICILLIN-POT CLAVULANATE ER 1000-62.5 MG PO TB12: 2.0000 | ORAL_TABLET | Freq: Two times a day (BID) | ORAL | Status: DC

## 2012-07-28 NOTE — Progress Notes (Addendum)
[facility] [address] [place] [address] [phone]  Date:  07/28/2012   Name:  Jasmine [name]   DOB:  1955/07/01   MRN:  [id number]  PCP:  Elvina Sidle, MD    Chief Complaint: Flank Pain   History of Present Illness:  Jasmine [name] is a 57 y.o. very pleasant female patient who presents with the following:  Seen here with left low back pain in October 2013. UA was normal, she was treated with prednisone for a back strain.   She is here today with a different sort of pain- she has pain in the left side/ left lower abdomen.   She has noted this pain every 5 or 6 months over the last 2 years. It hurt more last night than it ever has before.   The pain has been fluctuating over the last 24 hours.   She has not lifted anything heavy or had any other known injury.   No hematuria, frequency, dysuria, or other urinary symptoms. No vaginal symptoms.   Her son recently had surgery to repair a ureter problem- he had a "kinked ureter."  She does not have any GI symptoms such as nausea, vomiting, diarrhea, constipation.  She is eating normally  She notes the pain more when she moves.    No fever, chills or aches.    Her husband has been ill recently with a URI and then a stomach virus.    She has been dx with diverticulosis on a colonoscopy, but never been dx with diverticulitis. No history of abdominal operations  There is no problem list on file for this patient.   Past Medical History  Diagnosis Date  . Anxiety   . Arthritis   . Depression   . GERD (gastroesophageal reflux disease)     Past Surgical History  Procedure Laterality Date  . Colonoscopy    . Polypectomy    . Carpal tunnel repair  1997    both hands  . Knee replacements  04/2010    bilateral at same time    History  Substance Use Topics  . Smoking status: Current Some Day Smoker -- 0.50 packs/day for 2 years    Types: Cigarettes  . Smokeless tobacco: Never Used  . Alcohol Use:  0.6 oz/week    1 Glasses of wine per week     Comment: social    Family History  Problem Relation Age of Onset  . Multiple myeloma Mother   . Heart attack Father     No Known Allergies  Medication list has been reviewed and updated.  Current Outpatient Prescriptions on File Prior to Visit  Medication Sig Dispense Refill  . ALPRAZolam (XANAX) 0.5 MG tablet Take 0.0125 tablets by mouth as needed.      . calcium gluconate 500 MG tablet Take 500 mg by mouth 2 (two) times daily.        Marland Kitchen imipramine (TOFRANIL) 50 MG tablet Take 50 mg by mouth Daily.      . Multiple Vitamins-Minerals (MULTIVITAMIN WITH MINERALS) tablet Take 1 tablet by mouth daily.        Marland Kitchen PARoxetine (PAXIL) 40 MG tablet Take 40 mg by mouth Daily.       No current facility-administered medications on file prior to visit.    Review of Systems:  As per HPI- otherwise negative.   Physical Examination: Filed Vitals:   07/28/12 1218  BP: 156/84  Pulse: 105  Temp: 98.2 F (  36.8 C)  Resp: 17   Filed Vitals:   07/28/12 1218  Height: 5\' 7"  (1.702 m)  Weight: 222 lb (100.699 kg)   Body mass index is 34.76 kg/(m^2). Ideal Body Weight: Weight in (lb) to have BMI = 25: 159.3  GEN: WDWN, NAD, Non-toxic, A & O x 3, obese, looks well HEENT: Atraumatic, Normocephalic. Neck supple. No masses, No LAD. Ears and Nose: No external deformity. CV: RRR, No M/G/R. No JVD. No thrill. No extra heart sounds. PULM: CTA B, no wheezes, crackles, rhonchi. No retractions. No resp. distress. No accessory muscle use. ABD: S,  ND, +BS. No HSM.  She has significant LLQ tenderness and some rebound.  No other abdominal tenderness  EXTR: No c/c/e NEURO Normal gait.  PSYCH: Normally interactive. Conversant. Not depressed or anxious appearing.  Calm demeanor.   [facility] reading (PRIMARY) by  Dr. Patsy Lager. Abdominal series: non- specific, no free air  ABDOMEN - 2 VIEW  Comparison: None.  Findings: Upright film shows no evidence for  intraperitoneal free air. Supine film shows no gaseous bowel dilatation to suggest obstruction. No unexpected abdominopelvic calcification. Visualized bony structures are unremarkable.  IMPRESSION: Nonspecific bowel gas pattern.  Results for orders placed in visit on 07/28/12  POCT CBC      Result Value Range   WBC 10.1  4.6 - 10.2 K/uL   Lymph, poc 1.9  0.6 - 3.4   POC LYMPH PERCENT 18.5  10 - 50 %L   MID (cbc) 0.4  0 - 0.9   POC MID % 4.0  0 - 12 %M   POC Granulocyte 7.8 (*) 2 - 6.9   Granulocyte percent 77.5  37 - 80 %G   RBC 4.75  4.04 - 5.48 M/uL   Hemoglobin 13.1  12.2 - 16.2 g/dL   HCT, POC 16.1  09.6 - 47.9 %   MCV 88.7  80 - 97 fL   MCH, POC 27.6  27 - 31.2 pg   MCHC 31.1 (*) 31.8 - 35.4 g/dL   RDW, POC 04.5     Platelet Count, POC 337  142 - 424 K/uL   MPV 8.5  0 - 99.8 fL  POCT UA - MICROSCOPIC ONLY      Result Value Range   WBC, Ur, HPF, POC 5-10     RBC, urine, microscopic 0-1     Bacteria, U Microscopic neg     Mucus, UA small     Epithelial cells, urine per micros 1-4     Crystals, Ur, HPF, POC neg     Casts, Ur, LPF, POC renal tubular     Yeast, UA neg    POCT URINALYSIS DIPSTICK      Result Value Range   Color, UA yellow     Clarity, UA clear     Glucose, UA neg     Bilirubin, UA neg     Ketones, UA neg     Spec Grav, UA 1.020     Blood, UA trace-intact     pH, UA 6.5     Protein, UA trace     Urobilinogen, UA 0.2     Nitrite, UA neg     Leukocytes, UA small (1+)    POCT URINE PREGNANCY      Result Value Range   Preg Test, Ur Negative      Assessment and Plan: Abdominal pain, left lower quadrant - Plan: POCT CBC, Comprehensive metabolic panel, POCT UA - Microscopic Only, POCT urinalysis dipstick,  POCT urine pregnancy, DG Abd 2 Views, Amylase, Lipase, CT Abdomen Pelvis W Contrast, Urine culture  Adjustment disorder with mixed anxiety and depressed mood  Suspect diverticulitis.  As she has never been diagnosed with this in the past will  perform CT scan today.  Follow- up pending CT results.  Also check CMP, amylase/ lipase and urine culture  Signed Abbe Amsterdam, MD  Received CT result:  IMPRESSION:  1. Acute diverticulitis involving the distal descending colon with  a 1 cm likely intramural abscess. No extraluminal gas.  2. Diffuse colonic diverticulosis, extensive in the distal  descending and sigmoid region. No acute diverticulitis elsewhere.  3. Minimal ascites dependently in the pelvis.  4. Approximate 2.8 cm cyst arising from the right ovary. A non-  emergent follow-up ultrasound of the pelvis may be beneficial after  the patient has been treated for the diverticulitis, in order to  confirm that this is indeed a simple cyst in this postmenopausal  patient.  5. Diverticulum arising from the medial aspect of the descending  duodenum.  6. Diverticulum arising from the distal ileum (query Meckel's  diverticulum).  7. Small calcified, degenerated uterine fundal fibroid.  Discussed with general surgeon on call, Dr. Lindie Spruce.  Due to small size of abscess no drainage is needed at this time. Will start ABX and follow- up closely.  Called and discussed in detail with Anyely.  She will start abx today.  She is on imipramine and prefer to avoid combination of imipramine and cipro.  Therefore, will use augmentin xr at diverticulitis dose instead. She will come and see me in 48 hours for a recheck, sooner if any problems arise.  Encouraged to push fluids and eat a soft diet.  If she feels worse, has a fever, or any other problems she is to call. Called her pharmacy to make this medication change to augmentin.  She will see her OBG in a couple of weeks and plans to follow- up the ovarian cyst mentioned above- I will give her a copy of her CT report.    Meds ordered this encounter  Medications  . DISCONTD: ciprofloxacin (CIPRO) 750 MG tablet    Sig: Take 1 tablet (750 mg total) by mouth 2 (two) times daily.    Dispense:  20  tablet    Refill:  0  . DISCONTD: metroNIDAZOLE (FLAGYL) 500 MG tablet    Sig: Take 1 tablet (500 mg total) by mouth 4 (four) times daily. Take 1 pill twice daily for one week. NO alcohol    Dispense:  40 tablet    Refill:  0  . amoxicillin-clavulanate (AUGMENTIN XR) 1000-62.5 MG per tablet    Sig: Take 2 tablets by mouth 2 (two) times daily.    Dispense:  40 tablet    Refill:  0

## 2012-07-28 NOTE — Patient Instructions (Addendum)
Please proceed to [facility] to have your CT scan.  I will be in touch with your scan result as soon as it is available.

## 2012-07-29 ENCOUNTER — Telehealth: Payer: Self-pay | Admitting: *Deleted

## 2012-07-29 ENCOUNTER — Telehealth: Payer: Self-pay | Admitting: Family Medicine

## 2012-07-29 NOTE — Telephone Encounter (Signed)
Left a message on home and cell number for patient to call me.

## 2012-07-29 NOTE — Telephone Encounter (Signed)
Message copied by Daphine Deutscher on Tue Jul 29, 2012  2:27 PM ------      Message from: Hart Carwin      Created: Tue Jul 29, 2012  1:36 PM       Shanda Bumps, thanks for letting me know. She will definitely need a follow up CT scan but  Not while she is getting better. The resolution of the inflammation will have to be documented. If the surgeon declined to proceed with surgery then it is a medical problem and I will be happy to see her in follow up after she completes her antibiotics ,?? In about. I will ask Rene Kocher to find a place for her on my schedule.  Dora B 2 weeks?      ----- Message -----         From: Pearline Cables, MD         Sent: 07/29/2012   1:20 PM           To: Hart Carwin, MD            Dear Jasmine [name],            I saw Ms. Lynes yesterday and diagnosed her with diverticulitis via CT.  (She reports that you had diagnosed diverticulosis on her past colonoscopy.) She was noted to have a small (one cm) abscess- I discussed with general surgery and they did not feel she needed inpt treatment or surgery.  I am treating her with oral abx. Do we generally need to repeat CT to ensure the abscess resolves?  If this is a question for general surgery I can certainly get in touch with them.  Also let me know if you would like me to have her follow- up with your office, and I will be glad to arrange an appt.  For now I will follow her closely to ensure she is getting better.              Warm regards,            Jessica Copland,       ------

## 2012-07-29 NOTE — Telephone Encounter (Signed)
Called to check on how she is doing today.  No answer home or cell, so LMOM.  Please call or come in/ go to ED if feeling worse, not able to tolerate fluids, or any other problems. Otherwise will look for her for a recheck tomorrow.

## 2012-07-30 ENCOUNTER — Other Ambulatory Visit: Payer: Self-pay | Admitting: Obstetrics & Gynecology

## 2012-07-30 ENCOUNTER — Ambulatory Visit (INDEPENDENT_AMBULATORY_CARE_PROVIDER_SITE_OTHER): Payer: BC Managed Care – PPO | Admitting: Family Medicine

## 2012-07-30 VITALS — BP 138/100 | HR 96 | Temp 98.1°F | Resp 18 | Ht 67.0 in | Wt 220.0 lb

## 2012-07-30 DIAGNOSIS — N83201 Unspecified ovarian cyst, right side: Secondary | ICD-10-CM

## 2012-07-30 DIAGNOSIS — K5732 Diverticulitis of large intestine without perforation or abscess without bleeding: Secondary | ICD-10-CM

## 2012-07-30 LAB — POCT CBC
Hemoglobin: 13 g/dL (ref 12.2–16.2)
MCH, POC: 27.4 pg (ref 27–31.2)
MCV: 89.1 fL (ref 80–97)
RBC: 4.74 M/uL (ref 4.04–5.48)
WBC: 5.7 10*3/uL (ref 4.6–10.2)

## 2012-07-30 NOTE — Telephone Encounter (Signed)
Spoke with patient and scheduled OV on 08/12/12 at 2:00 PM with Dr. Juanda Chance.

## 2012-07-30 NOTE — Patient Instructions (Addendum)
Continue to drink lots of fluids and take your antibiotics.  Dr. Juanda Chance will see you for follow- up; be sure you talk with her office and get an appointment arranged.   I will contact Dr. Hyacinth Meeker about your ovarian cyst- let's have you see her regarding this issue in the next month or so  The only significant abnormality in your labs was your alkaline phosphatase- this is one of your liver function tests and it was slightly elevated.  When you see Dr. Juanda Chance she may wish to recheck this for you as part of your blood work- if not I am glad to do this for you.    If you start to feel worse, do not continue to feel better, or have any concerns whatsoever please call, email, or come back in to clinic or the ER if needed

## 2012-07-30 NOTE — Progress Notes (Signed)
[facility] [address] [place] [address] [phone]  Date:  07/30/2012   Name:  Jasmine [name]   DOB:  [birth year]   MRN:  [id number]  PCP:  Elvina Sidle, MD    Chief Complaint: Follow-up   History of Present Illness:  Jasmine [name] is a 57 y.o. very pleasant female patient who presents with the following:  She is here today to recheck diverticulitis- she was here 2 days ago and diagnosed via CT.  She is taking augmentin ER 2000 mg BID. She feels that she is doing well, and her pain is reduced.  She does not have much appetite but is eating a light, soft diet and pushing fluids.  She has been called by Dr. Regino Schultze office but has not yet had time to call them back to schedule an appt.    Leda Quail is her OBG- she plans to follow- up the ovarian cyst noted on her CT at her appt this month.    Patient Active Problem List  Diagnosis  . Adjustment disorder with mixed anxiety and depressed mood    Past Medical History  Diagnosis Date  . Anxiety   . Arthritis   . Depression   . GERD (gastroesophageal reflux disease)     Past Surgical History  Procedure Laterality Date  . Colonoscopy    . Polypectomy    . Carpal tunnel repair  1997    both hands  . Knee replacements  04/2010    bilateral at same time    History  Substance Use Topics  . Smoking status: Current Some Day Smoker -- 0.50 packs/day for 2 years    Types: Cigarettes  . Smokeless tobacco: Never Used  . Alcohol Use: 0.6 oz/week    1 Glasses of wine per week     Comment: social    Family History  Problem Relation Age of Onset  . Multiple myeloma Mother   . Heart attack Father     No Known Allergies  Medication list has been reviewed and updated.  Current Outpatient Prescriptions on File Prior to Visit  Medication Sig Dispense Refill  . amoxicillin-clavulanate (AUGMENTIN XR) 1000-62.5 MG per tablet Take 2 tablets by mouth 2 (two) times daily.  40 tablet  0  . calcium  gluconate 500 MG tablet Take 500 mg by mouth 2 (two) times daily.        Marland Kitchen imipramine (TOFRANIL) 50 MG tablet Take 50 mg by mouth Daily.      . Multiple Vitamins-Minerals (MULTIVITAMIN WITH MINERALS) tablet Take 1 tablet by mouth daily.        Marland Kitchen PARoxetine (PAXIL) 40 MG tablet Take 40 mg by mouth Daily.      Marland Kitchen ALPRAZolam (XANAX) 0.5 MG tablet Take 0.0125 tablets by mouth as needed.       No current facility-administered medications on file prior to visit.    Review of Systems:  As per HPI- otherwise negative.   Physical Examination: Filed Vitals:   07/30/12 0742  BP: 138/100  Pulse: 115  Temp: 98.1 F (36.7 C)  Resp: 18   Filed Vitals:   07/30/12 0742  Height: 5\' 7"  (1.702 m)  Weight: 220 lb (99.791 kg)   Body mass index is 34.45 kg/(m^2). Ideal Body Weight: Weight in (lb) to have BMI = 25: 159.3  GEN: WDWN, NAD, Non-toxic, A & O x 3, overweight HEENT: Atraumatic, Normocephalic. Neck supple. No masses, No LAD. Ears and Nose: No  external deformity. CV: RRR, No M/G/R. No JVD. No thrill. No extra heart sounds. PULM: CTA B, no wheezes, crackles, rhonchi. No retractions. No resp. distress. No accessory muscle use. ABD: S, ND, +BS. No rebound. No HSM.  Decreased, minimal LLQ tenderness today.   EXTR: No c/c/e NEURO Normal gait.  PSYCH: Normally interactive. Conversant. Not depressed or anxious appearing.  Calm demeanor.    Results for orders placed in visit on 07/30/12  POCT CBC      Result Value Range   WBC 5.7  4.6 - 10.2 K/uL   Lymph, poc 1.3  0.6 - 3.4   POC LYMPH PERCENT 22.1  10 - 50 %L   MID (cbc) 0.4  0 - 0.9   POC MID % 7.3  0 - 12 %M   POC Granulocyte 4.0  2 - 6.9   Granulocyte percent 70.6  37 - 80 %G   RBC 4.74  4.04 - 5.48 M/uL   Hemoglobin 13.0  12.2 - 16.2 g/dL   HCT, POC 16.1  09.6 - 47.9 %   MCV 89.1  80 - 97 fL   MCH, POC 27.4  27 - 31.2 pg   MCHC 30.8 (*) 31.8 - 35.4 g/dL   RDW, POC 04.5     Platelet Count, POC 334  142 - 424 K/uL   MPV 8.5  0 -  99.8 fL    Assessment and Plan: Diverticulitis of colon (without mention of hemorrhage) - Plan: POCT CBC  Her BP has generally been controlled in the past- suspect mildly elevated today due to current illness.  She has been noted to have mild tachycardia in the past.  She has 2 follow-up appts coming up with GI and OBG so her BP can be monitored  Her diverticulitis is being treated with augmentin, and clinically she is improved.  Plan to have her follow- up with Dr. Juanda Chance, but she knows to let me know if she does not continue to improve.  Continue to push fluids  Right ovarian cyst noted on her CT scan as well- spoke with Dr. Rondel Baton nurse Tresa Endo, faxed a copy of her CT to her office. This will be further evaluated at her upcoming apt.   Pt is aware of slight elevation of her alkaline phosphatase, and will have this followed up with Dr. Juanda Chance or with [name].   See patient instructions for more details.     Signed Abbe Amsterdam, MD

## 2012-07-31 ENCOUNTER — Telehealth: Payer: Self-pay | Admitting: Family Medicine

## 2012-07-31 ENCOUNTER — Other Ambulatory Visit: Payer: Self-pay | Admitting: Obstetrics & Gynecology

## 2012-07-31 NOTE — Telephone Encounter (Signed)
Dr. Hyacinth Meeker kindly called to confirm that she had received Ader's CT report and that she planned to follow- up her ovarian cyst

## 2012-08-02 ENCOUNTER — Telehealth: Payer: Self-pay | Admitting: Family Medicine

## 2012-08-02 NOTE — Telephone Encounter (Signed)
Called the patient to see how she was doing.  Left message for her to call us back if she is not feeling any better

## 2012-08-05 ENCOUNTER — Encounter: Payer: Self-pay | Admitting: *Deleted

## 2012-08-06 ENCOUNTER — Telehealth: Payer: Self-pay | Admitting: Obstetrics & Gynecology

## 2012-08-06 ENCOUNTER — Other Ambulatory Visit: Payer: Self-pay | Admitting: Obstetrics & Gynecology

## 2012-08-06 ENCOUNTER — Telehealth: Payer: Self-pay | Admitting: Family Medicine

## 2012-08-06 DIAGNOSIS — N83209 Unspecified ovarian cyst, unspecified side: Secondary | ICD-10-CM

## 2012-08-06 NOTE — Telephone Encounter (Signed)
Called to check on her condition- she is "doing fine."  She is working on getting in with her OBGYN.   She will see Dr. Juanda Chance next week.   She will let us know if any other concerns

## 2012-08-06 NOTE — Telephone Encounter (Signed)
Pt is going out of town.  She is scheduled for PUS/SHG on 4/15 but is wondering with a cyst if it is okay to go out of town. Please advise.

## 2012-08-07 ENCOUNTER — Telehealth: Payer: Self-pay | Admitting: Obstetrics & Gynecology

## 2012-08-07 NOTE — Telephone Encounter (Signed)
Yes.  It is fine to travel.

## 2012-08-07 NOTE — Telephone Encounter (Signed)
Pt is going out of town tomorrow (08/08/12) and is asking if it is okay to travel. (Pt has cyst).  Please advise.

## 2012-08-07 NOTE — Telephone Encounter (Signed)
Spoke with pt to advise that Dr Hyacinth Meeker said it is fine to travel. Pt appreciative.  aa

## 2012-08-12 ENCOUNTER — Other Ambulatory Visit: Payer: Self-pay

## 2012-08-12 ENCOUNTER — Ambulatory Visit (INDEPENDENT_AMBULATORY_CARE_PROVIDER_SITE_OTHER): Payer: BC Managed Care – PPO | Admitting: Internal Medicine

## 2012-08-12 ENCOUNTER — Encounter: Payer: Self-pay | Admitting: Internal Medicine

## 2012-08-12 ENCOUNTER — Other Ambulatory Visit: Payer: Self-pay | Admitting: Obstetrics & Gynecology

## 2012-08-12 VITALS — BP 120/80 | HR 100 | Ht 67.0 in | Wt 225.0 lb

## 2012-08-12 DIAGNOSIS — K5732 Diverticulitis of large intestine without perforation or abscess without bleeding: Secondary | ICD-10-CM

## 2012-08-12 DIAGNOSIS — R933 Abnormal findings on diagnostic imaging of other parts of digestive tract: Secondary | ICD-10-CM

## 2012-08-12 NOTE — Progress Notes (Signed)
Jasmine [name] [birth year] MRN [id number]  History of Present Illness:  This is a 57 year old white [name] with acute left lower quadrant abdominal pain which occurred rather suddenly on 07/28/2012. She was seen by Dr. Dallas Schimke who obtained  CT scan of the abdomen and started the patient on 2 week course of amoxicillin . A CT scan showed acute diverticulitis of the descending colon with a 1 cm intramural abscess. There was minimal pelvic ascites and a small right ovarian cyst 2.8 cm. She has a GYN appointment tomorrow. She was also noted to have a small duodenal diverticulum and distal small bowel diverticulum, possibly Meckel's. She is doing much better now being able to tolerate a regular diet. There was never any fever or rectal bleeding. Her bowel habits have been regular. This was the first documented attack of diverticulitis although she has been having intermittent left lower quadrant abdominal discomfort in the past. A screening colonoscopy in November 2012 showed severe diverticulosis of the left colon which appeared to be very torturous.   Past Medical History  Diagnosis Date  . Anxiety   . Arthritis   . Depression   . GERD (gastroesophageal reflux disease)   . Diverticulitis   . Diverticulosis   . Ovarian cyst    Past Surgical History  Procedure Laterality Date  . Carpal tunnel release Bilateral 1997  . Total knee arthroplasty Bilateral 04/2010    reports that she has been smoking Cigarettes.  She has a 1 pack-year smoking history. She has never used smokeless tobacco. She reports that she drinks about 0.6 ounces of alcohol per week. She reports that she does not use illicit drugs. family history includes Heart attack in her father and Multiple myeloma in her mother. No Known Allergies      Review of Systems: Negative for abdominal pain change in bowel habits bleeding  The remainder of the 10 point ROS is negative except as outlined in H&P   Physical Exam: General appearance   Well developed, in no distress. Eyes- non icteric. HEENT nontraumatic, normocephalic. Mouth no lesions, tongue papillated, no cheilosis. Neck supple without adenopathy, thyroid not enlarged, no carotid bruits, no JVD. Lungs Clear to auscultation bilaterally. Cor normal S1, normal S2, regular rhythm, no murmur,  quiet precordium. Abdomen: Soft nontender with minimal discomfort on deep depression in left lower quadrant. No rebound or mass. Rectal: Not done. Extremities no pedal edema. Skin no lesions. Neurological alert and oriented x 3. Psychological normal mood and affect.  Assessment and Plan:  Problem #72 Jasmine [name] with first episode of documented acute sigmoid diverticulitis with a small intramural abscess who responded to a 14 days course of amoxicillin with complete resolution of her symptoms. I had a long discussion with her about the natural history of diverticulosis and possible indications in the future for sigmoid resection. She will start on Benefiber 1-2 teaspoons daily and will call us immediately if she develops recurrent left lower quadrant abdominal pain. We will not  repeat a CT scan because her clinical improvement is very clear. I would recommend a followup colonoscopy in 5 years from the initial exam which would be in November 2017.   Jasmine/15/2014 Jasmine [name]

## 2012-08-12 NOTE — Patient Instructions (Addendum)
You will be due for a recall colonoscopy in 02/2016. We will send you a reminder in the mail when it gets closer to that time.  CC: Dr Shanda Bumps Copland

## 2012-08-13 ENCOUNTER — Ambulatory Visit (INDEPENDENT_AMBULATORY_CARE_PROVIDER_SITE_OTHER): Payer: BC Managed Care – PPO | Admitting: Obstetrics & Gynecology

## 2012-08-13 ENCOUNTER — Other Ambulatory Visit: Payer: Self-pay | Admitting: Obstetrics & Gynecology

## 2012-08-13 ENCOUNTER — Ambulatory Visit (INDEPENDENT_AMBULATORY_CARE_PROVIDER_SITE_OTHER): Payer: BC Managed Care – PPO

## 2012-08-13 ENCOUNTER — Ambulatory Visit (INDEPENDENT_AMBULATORY_CARE_PROVIDER_SITE_OTHER): Payer: 59

## 2012-08-13 ENCOUNTER — Other Ambulatory Visit: Payer: Self-pay | Admitting: *Deleted

## 2012-08-13 VITALS — BP 128/70 | HR 82 | Resp 18

## 2012-08-13 DIAGNOSIS — N83209 Unspecified ovarian cyst, unspecified side: Secondary | ICD-10-CM

## 2012-08-13 DIAGNOSIS — D219 Benign neoplasm of connective and other soft tissue, unspecified: Secondary | ICD-10-CM

## 2012-08-13 DIAGNOSIS — D259 Leiomyoma of uterus, unspecified: Secondary | ICD-10-CM

## 2012-08-13 NOTE — Progress Notes (Unsigned)
57 y.o.Marriedfemale here for a pelvic ultrasound.  H/o ovarian cyst noted on CT obtained when patient has episode of diverticulitis.  She was treated with antibiotics and is feeling much better.  Was seen by Dr. Juanda Chance for this.  Denies VB or pelvic pain at this time.  H/O urinary incontinence.  Was referred for pelvic PT to [facility].  No changes in regards to this history either.  No LMP recorded. Patient is postmenopausal.  Sexually active:  yes  Contraception: vasectomy  Findings in report below.  Significant findings include right 2.4 x 2.9cm simple, smooth walled cyst without increased blood flow.  Overall, right ovary measures 3.8 x 3.2 x 2.9cm.  No free fluid.  Normal right ovary.  Small calcified 1.0 cm intramural fibroid.  Discussed findings with patient.  Pictures reviewed.  Assessment:  Right simple appearing ovarian cyst Plan: Recheck ovary 3-4 months.  Consider ca-125 if any changes occur.  ~15 minutes spent with patient >50% of time was in face to face discussion of above.

## 2012-08-14 NOTE — Patient Instructions (Signed)
We will call with the appt for the ultrasound.

## 2012-08-14 NOTE — Telephone Encounter (Signed)
Patient seen yesterday and was told to call Kennon Rounds to schedule a three month recheck and pus/shgm with Dr. Hyacinth Meeker. Also, patient want some advice on how often she needs a mammogram?

## 2012-08-18 ENCOUNTER — Encounter: Payer: Self-pay | Admitting: Gynecology

## 2012-08-19 ENCOUNTER — Other Ambulatory Visit: Payer: Self-pay | Admitting: Obstetrics & Gynecology

## 2012-08-19 DIAGNOSIS — N83209 Unspecified ovarian cyst, unspecified side: Secondary | ICD-10-CM

## 2012-08-19 NOTE — Telephone Encounter (Signed)
LMTCB

## 2012-08-19 NOTE — Progress Notes (Signed)
  57 y.o.Marriedfemale here for a pelvic ultrasound to evaluate an ovarian cyst noted on CT ob abdomen and pelvis due to diverticulitis.  She has been on antibiotics and is feeling much better.  Saw Dr. Juanda Chance who discussed with her consideration of colectomy due to diverticulosis.  She really does not want to do that at this time.  Denies pelvic or abdominal pain.  No fevers.  No bowel or bladder issues.  No vaginal bleeding or discharge.  No LMP recorded. Patient is postmenopausal.  FINDINGS:  Report is below.   UTERUS: 7.0 x 4.7 x 3.5cm with 1.0cm calcified fibroid EMS:  2.59mm ADNEXA:   Left ovary 2.8 x 1.1.x 0.8cm, atrophic   Right ovary 3.8 x 2.9 x 3.2cm with 2.9cm simple appearing ovarian cyst, no increased blood flow noted.  No complex features noted. CUL DE SAC: neg for free fluid  Images shown to patient and results discussed  Assessment:  Right, simple appearing, 2.9cm ovarian cyst Plan: Recheck 3-4 months to ensure not enlarging in size.    15 minutes spent with patient >50% of time was in face to face discussion of above.

## 2012-08-20 ENCOUNTER — Encounter: Payer: Self-pay | Admitting: Obstetrics & Gynecology

## 2012-08-20 NOTE — Patient Instructions (Signed)
Plan repeat ultrasound 3-4 months.  You will be called with the appointment.

## 2012-08-22 NOTE — Telephone Encounter (Signed)
Follow up call to patient to schedule 3 month follow up ultrasound and answer questions about MMG. LMTCB.

## 2012-08-27 ENCOUNTER — Other Ambulatory Visit: Payer: BC Managed Care – PPO | Admitting: Obstetrics & Gynecology

## 2012-08-27 ENCOUNTER — Other Ambulatory Visit: Payer: BC Managed Care – PPO

## 2012-08-28 NOTE — Telephone Encounter (Signed)
Patient returning my call regarding follow up ultrasound and Dr Rondel Baton recommendation for MMG.  Patient states her last MMG was normal one year ago and no family history.  Advised recommendation is for yearly MMG starting at age 57.  Patient states she will schedule.  Completed antibiotics approx the time of last visit.  Follow up-PUS scheduled for 09-08-12 at 3pm.

## 2012-08-29 ENCOUNTER — Other Ambulatory Visit: Payer: Self-pay | Admitting: *Deleted

## 2012-08-29 DIAGNOSIS — N83209 Unspecified ovarian cyst, unspecified side: Secondary | ICD-10-CM

## 2012-09-02 ENCOUNTER — Other Ambulatory Visit: Payer: Self-pay | Admitting: Obstetrics & Gynecology

## 2012-09-02 DIAGNOSIS — N83209 Unspecified ovarian cyst, unspecified side: Secondary | ICD-10-CM

## 2012-09-08 ENCOUNTER — Ambulatory Visit (INDEPENDENT_AMBULATORY_CARE_PROVIDER_SITE_OTHER): Payer: BC Managed Care – PPO

## 2012-09-08 ENCOUNTER — Ambulatory Visit: Payer: Self-pay | Admitting: Obstetrics & Gynecology

## 2012-09-08 ENCOUNTER — Ambulatory Visit (INDEPENDENT_AMBULATORY_CARE_PROVIDER_SITE_OTHER): Payer: BC Managed Care – PPO | Admitting: Obstetrics & Gynecology

## 2012-09-08 ENCOUNTER — Encounter: Payer: Self-pay | Admitting: Obstetrics & Gynecology

## 2012-09-08 ENCOUNTER — Other Ambulatory Visit: Payer: Self-pay | Admitting: Obstetrics & Gynecology

## 2012-09-08 VITALS — BP 122/70 | Ht 66.5 in | Wt 224.4 lb

## 2012-09-08 DIAGNOSIS — N83201 Unspecified ovarian cyst, right side: Secondary | ICD-10-CM

## 2012-09-08 DIAGNOSIS — N83339 Acquired atrophy of ovary and fallopian tube, unspecified side: Secondary | ICD-10-CM

## 2012-09-08 DIAGNOSIS — Z0000 Encounter for general adult medical examination without abnormal findings: Secondary | ICD-10-CM

## 2012-09-08 DIAGNOSIS — D259 Leiomyoma of uterus, unspecified: Secondary | ICD-10-CM

## 2012-09-08 DIAGNOSIS — N83209 Unspecified ovarian cyst, unspecified side: Secondary | ICD-10-CM

## 2012-09-08 DIAGNOSIS — D251 Intramural leiomyoma of uterus: Secondary | ICD-10-CM

## 2012-09-08 DIAGNOSIS — N854 Malposition of uterus: Secondary | ICD-10-CM

## 2012-09-08 DIAGNOSIS — R32 Unspecified urinary incontinence: Secondary | ICD-10-CM

## 2012-09-08 DIAGNOSIS — Z01419 Encounter for gynecological examination (general) (routine) without abnormal findings: Secondary | ICD-10-CM

## 2012-09-08 DIAGNOSIS — N838 Other noninflammatory disorders of ovary, fallopian tube and broad ligament: Secondary | ICD-10-CM

## 2012-09-08 LAB — POCT URINALYSIS DIPSTICK
Bilirubin, UA: NEGATIVE
Ketones, UA: NEGATIVE

## 2012-09-08 NOTE — Progress Notes (Addendum)
57 y.o. [id number] MarriedCaucasianF here for annual exam and for f/u ultrasound.  No vaginal bleeding.  Smoking again after almost 20 years.  Doesn't have a good reason why she started back.  Did go and see Dr. Sherron Monday last year but was rescheduled three times and gave up.  Frustrated by this.  Has incontinence with coughing, sneezing, laughing.  Two vaginal deliveries were of almost 9 pound and 9 pound, 7 oz babies.  Patient's last menstrual period was 01/28/2010.          Sexually active: no  The current method of family planning is post menopausal status and vasectomy  Exercising: yes  walking and yardwork Smoker:  Yes, 1/2 PPD  Health Maintenance: Pap:  08/22/11 ASCUS, HR HPV neg MMG:  09/19/11 Colonoscopy:  2012, Dr. Juanda Chance, f/u 10 yrs BMD:   Heel test years ago TDaP:  2010 Labs: 2012 here   reports that she has been smoking Cigarettes.  She has been smoking about 0.50 packs per day. She has never used smokeless tobacco. She reports that she drinks about 0.6 ounces of alcohol per week. She reports that she does not use illicit drugs.  Past Medical History  Diagnosis Date  . Anxiety   . Arthritis   . Depression   . GERD (gastroesophageal reflux disease)   . Diverticulitis   . Diverticulosis   . Ovarian cyst     Past Surgical History  Procedure Laterality Date  . Carpal tunnel release Bilateral 1997  . Total knee arthroplasty Bilateral 04/2010    Current Outpatient Prescriptions  Medication Sig Dispense Refill  . ALPRAZolam (XANAX) 0.5 MG tablet Take 1 tablet by mouth as needed.      . calcium gluconate 500 MG tablet Take 500 mg by mouth 2 (two) times daily.        Jennette Banker Sodium 30-100 MG CAPS Take by mouth.      . FIBER FORMULA CAPS Take by mouth.      . fish oil-omega-3 fatty acids 1000 MG capsule Take 2 g by mouth daily.      Marland Kitchen imipramine (TOFRANIL) 50 MG tablet Take 50 mg by mouth Daily.      . Multiple Vitamins-Minerals (MULTIVITAMIN WITH MINERALS)  tablet Take 1 tablet by mouth daily.        Marland Kitchen PARoxetine (PAXIL) 40 MG tablet Take 40 mg by mouth Daily.       No current facility-administered medications for this visit.    Family History  Problem Relation Age of Onset  . Multiple myeloma Mother   . Heart attack Father     ROS:  Pertinent items are noted in HPI.  Otherwise, a comprehensive ROS was negative.  Exam:   BP 122/70  Ht 5' 6.5" (1.689 m)  Wt 224 lb 6.4 oz (101.787 kg)  BMI 35.68 kg/m2  LMP 01/28/2010  Weight change: +10  Height: 5' 6.5" (168.9 cm)  Ht Readings from Last 3 Encounters:  09/08/12 5' 6.5" (1.689 m)  08/12/12 5\' 7"  (1.702 m)  07/30/12 5\' 7"  (1.702 m)    General appearance: alert, cooperative and appears stated age Head: Normocephalic, without obvious abnormality, atraumatic Neck: no adenopathy, supple, symmetrical, trachea midline and thyroid midline, non enlarged, no masses/nodules Lungs: clear to auscultation bilaterally Breasts: normal appearance, no masses or tenderness Heart: regular rate and rhythm Abdomen: soft, non-tender; bowel sounds normal; no masses,  no organomegaly Extremities: extremities normal, atraumatic, no cyanosis or edema Skin: Skin color, texture, turgor normal. No  rashes or lesions Lymph nodes: Cervical, supraclavicular, and axillary nodes normal. No abnormal inguinal nodes palpated Neurologic: Grossly normal   Pelvic: External genitalia:  no lesions              Urethra:  normal appearing urethra with no masses, tenderness or lesions              Bartholins and Skenes: normal                 Vagina: normal appearing vagina with normal color and discharge, no lesions              Cervix: no lesions              Pap taken: no Bimanual Exam:  Uterus:  normal size, contour, position, consistency, mobility, non-tender              Adnexa: no mass, fullness, tenderness               Rectovaginal: Confirms               Anus:  normal sphincter tone, no lesions  Ultrasound  results d/w pt.  Reports below.  Findings showed: Uterus:  6.1 x 5.1 x 3.5cm Endometrium:  1.28mm Left ovary: 2.6 x 1.7 x 0.7cm Right ovary:  3.6 x 3.0 x 2.5cm with 32 x 22 x 27mm cyst, smooth walled, no increased vascularity, appears simple   A:  Well Woman with normal exam PMP, no HRT SUI H/O Depression Smoker Right ovarian, simple appearing cyst ~3cm H/o low Vit D, normal 1 year ago.  P:  Yearly MMG.   No Pap today.  ASCUS with neg HR HPV 2013. Will schedule urodynamics with Dr. Edward Jolly for additional evaluation of incontinence Encouraged smoking cessation, she is not ready to proceed with any measures at this time. Labs 2012 here.  Will repeat 1-2 years. BMD at age 52. Repeat U/S 6 months and if stable, in one year.  ~15 minutes spent with patient discussing ultrasound result where >50% of time was in face to face discussion of above.  This was in addition to AEX evaluation.

## 2012-09-08 NOTE — Patient Instructions (Signed)

## 2012-09-10 ENCOUNTER — Ambulatory Visit: Payer: BC Managed Care – PPO | Admitting: Obstetrics & Gynecology

## 2012-09-11 ENCOUNTER — Telehealth: Payer: Self-pay | Admitting: *Deleted

## 2012-09-11 NOTE — Telephone Encounter (Signed)
Call to patient and advised that consult/evaluation with dr Edward Jolly needed before scheduling urodynamic testing.  Appt with dr Edward Jolly sched for 09-17-12 and 6 month follow up PUS to recheck ovary sched for 03-16-13.

## 2012-09-12 ENCOUNTER — Ambulatory Visit: Payer: BC Managed Care – PPO | Admitting: Obstetrics & Gynecology

## 2012-09-18 ENCOUNTER — Encounter: Payer: Self-pay | Admitting: Obstetrics and Gynecology

## 2012-09-18 ENCOUNTER — Ambulatory Visit (INDEPENDENT_AMBULATORY_CARE_PROVIDER_SITE_OTHER): Payer: BC Managed Care – PPO | Admitting: Obstetrics and Gynecology

## 2012-09-18 VITALS — BP 130/80 | Ht 67.0 in | Wt 224.0 lb

## 2012-09-18 DIAGNOSIS — N812 Incomplete uterovaginal prolapse: Secondary | ICD-10-CM

## 2012-09-18 DIAGNOSIS — N393 Stress incontinence (female) (male): Secondary | ICD-10-CM

## 2012-09-18 DIAGNOSIS — R32 Unspecified urinary incontinence: Secondary | ICD-10-CM

## 2012-09-18 NOTE — Patient Instructions (Addendum)
Please refer to your handouts on prolapse and incontinence.

## 2012-09-18 NOTE — Progress Notes (Signed)
Patient ID: Jasmine [name], female   DOB: 16-Nov-1955, 57 y.o.   MRN: [id number]  Subjective  Leakage of urine for year.  Hopeful to avoid surgery.  Has not done any physical therapy. Has had a normal cystoscopy with Dr. Sherron Monday.  No urodynamic testing to date.  Leaks with sneezing or lifting or laughter.  Last few months leaks while sleeping.  Sleeps very soundly. Wears a pad 24 hours a day. Rarely gets up at night to void. Voids every hour and 45 minutes during the day. Empties often to avoid leaks. Can leak without warning, but not often. Voids well and completely.  Denies hematuria.  Had UTIs in past, last was about one year ago.  May have had up to 2 - 3 a year, but this is not usual.   No pyelonephritis. No nephrolithiasis.   Denies regular constipation. Uses stool softeners to deal with constipation from calcium. Denies fecal incontinence.   OB history  First delivery was operative vaginal with forceps - 8 pounds 12 or 14 ounces Second delivery was spontaneous vaginal - 9 pounds, 6 ounces     No other gynecologic type procedures.    Taking imipramine and Paxil for depression.    Physical exam  Abdomen - obese, soft, nontender, nondistended.  No hepatosplenomegaly or organomegaly. Pelvic - normal external genitalis and urethra.  Cervix and vagina without lesions.  Good uterine and posterior wall support.  Minimal cystocele.  Leakage of urine with valsalva maneuver. Uterus small and nontender.  No adnexal masses or tenderness. Rectovaginal exam confirms the above  Assessment  Incomplete uterovaginal prolapse Genuine stress incontinence Night time leakage.  I am uncertain if this is true enuresis or stress incontinence from position changes at night.  Currently on imipramine.  Plan  I had a comprehensive discussion with the patient regarding urinary incontinence and prolapse.  She understands the etiologies and possible treatment options including physical therapy, weight  loss, and a midurethral sling with synthetic material along with a native tissue repair for her very minimal cystocele.  We discussed urodynamics prior to any surgical procedure for urinary incontinence.  Patient understand the potential risks of surgery including but not limited to bleeding, infection, damage to surrounding organs, exposure or erosion of the mesh, urinary retention, complications of anesthesia, and need for reoperation.  Patient received materials from [employer] on pelvic organ prolapse and urinary incontinence and treatment thereof.  She will do her personal study and then call with her choice.  She has indicated that if she chooses to do physical therapy, she would like to be seen at [facility].

## 2013-01-01 ENCOUNTER — Telehealth: Payer: Self-pay | Admitting: Obstetrics & Gynecology

## 2013-01-01 NOTE — Telephone Encounter (Signed)
LMTCB to r/s PUS in Nov.

## 2013-02-11 ENCOUNTER — Ambulatory Visit (INDEPENDENT_AMBULATORY_CARE_PROVIDER_SITE_OTHER): Payer: BC Managed Care – PPO | Admitting: Radiology

## 2013-02-11 DIAGNOSIS — Z23 Encounter for immunization: Secondary | ICD-10-CM

## 2013-03-09 ENCOUNTER — Ambulatory Visit (INDEPENDENT_AMBULATORY_CARE_PROVIDER_SITE_OTHER): Payer: BC Managed Care – PPO | Admitting: Family Medicine

## 2013-03-09 VITALS — BP 130/90 | HR 97 | Temp 97.8°F | Resp 16 | Ht 66.75 in | Wt 225.0 lb

## 2013-03-09 DIAGNOSIS — Z0000 Encounter for general adult medical examination without abnormal findings: Secondary | ICD-10-CM

## 2013-03-09 DIAGNOSIS — N393 Stress incontinence (female) (male): Secondary | ICD-10-CM

## 2013-03-09 LAB — POCT CBC
Granulocyte percent: 75.5 %G (ref 37–80)
HCT, POC: 46.8 % (ref 37.7–47.9)
Hemoglobin: 14.8 g/dL (ref 12.2–16.2)
Lymph, poc: 1.4 (ref 0.6–3.4)
MCH, POC: 29.8 pg (ref 27–31.2)
MCHC: 31.6 g/dL — AB (ref 31.8–35.4)
MCV: 94.1 fL (ref 80–97)
MID (cbc): 0.4 (ref 0–0.9)
MPV: 9.7 fL (ref 0–99.8)
POC Granulocyte: 5.5 (ref 2–6.9)
POC LYMPH PERCENT: 18.9 %L (ref 10–50)
POC MID %: 5.6 %M (ref 0–12)
Platelet Count, POC: 276 10*3/uL (ref 142–424)
RBC: 4.97 M/uL (ref 4.04–5.48)
RDW, POC: 14.5 %
WBC: 7.3 10*3/uL (ref 4.6–10.2)

## 2013-03-09 LAB — POCT URINALYSIS DIPSTICK
Bilirubin, UA: NEGATIVE
Blood, UA: NEGATIVE
Glucose, UA: NEGATIVE
Ketones, UA: NEGATIVE
Leukocytes, UA: NEGATIVE
Nitrite, UA: NEGATIVE
Protein, UA: NEGATIVE
Spec Grav, UA: 1.02
Urobilinogen, UA: 0.2
pH, UA: 7

## 2013-03-09 LAB — TSH: TSH: 3.419 u[IU]/mL (ref 0.350–4.500)

## 2013-03-09 LAB — POCT UA - MICROSCOPIC ONLY
Amorphous, UA: POSITIVE
Bacteria, U Microscopic: NEGATIVE
Casts, Ur, LPF, POC: NEGATIVE
Crystals, Ur, HPF, POC: NEGATIVE
Mucus, UA: POSITIVE
RBC, urine, microscopic: NEGATIVE
Yeast, UA: NEGATIVE

## 2013-03-09 LAB — LIPID PANEL
Cholesterol: 183 mg/dL (ref 0–200)
HDL: 62 mg/dL (ref >39)
LDL Cholesterol: 103 mg/dL — ABNORMAL HIGH (ref 0–99)
Total CHOL/HDL Ratio: 3 Ratio
Triglycerides: 88 mg/dL (ref <150)
VLDL: 18 mg/dL (ref 0–40)

## 2013-03-09 LAB — COMPREHENSIVE METABOLIC PANEL
ALT: 15 U/L (ref 0–35)
AST: 15 U/L (ref 0–37)
Albumin: 4.2 g/dL (ref 3.5–5.2)
Alkaline Phosphatase: 130 U/L — ABNORMAL HIGH (ref 39–117)
BUN: 11 mg/dL (ref 6–23)
CO2: 28 mEq/L (ref 19–32)
Calcium: 9.7 mg/dL (ref 8.4–10.5)
Chloride: 104 mEq/L (ref 96–112)
Creat: 0.83 mg/dL (ref 0.50–1.10)
Glucose, Bld: 104 mg/dL — ABNORMAL HIGH (ref 70–99)
Potassium: 4.9 mEq/L (ref 3.5–5.3)
Sodium: 138 mEq/L (ref 135–145)
Total Bilirubin: 0.4 mg/dL (ref 0.3–1.2)
Total Protein: 7.1 g/dL (ref 6.0–8.3)

## 2013-03-09 NOTE — Progress Notes (Signed)
  Subjective:    Patient ID: Jasmine [name], female    DOB: December 01, 1955, 57 y.o.   MRN: [id number]  HPI    Review of Systems  Constitutional: Negative.   HENT: Negative.   Eyes:       Per patient Right has a fiber on eyelid  Respiratory: Negative.   Cardiovascular: Negative.   Gastrointestinal: Negative.   Endocrine: Negative.   Genitourinary: Negative.        Incontinence  Musculoskeletal: Negative.   Skin: Negative.   Allergic/Immunologic: Negative.   Neurological: Negative.   Hematological: Negative.   Psychiatric/Behavioral: Negative.        Objective:   Physical Exam        Assessment & Plan:

## 2013-03-09 NOTE — Progress Notes (Signed)
Subjective:    Patient ID: Jasmine [name], female    DOB: 08-09-1955, 57 y.o.   MRN: [id number]  HPI This chart was scribed for Kenyon Ana Shiasia Porro-MD, by Ladona Ridgel Day, Scribe. This patient was seen in room 14 and the patient's care was started at 9:32 AM.  HPI Comments: Jasmine [name] is a 57 y.o. female who is here for a physical..  She runs a business doing Airline pilot.  She has a dermatologist, gynecologist, orthopedist. She has chronic knee pain and does complain about some stress incontinence.  Past Medical History  Diagnosis Date  . Anxiety   . Arthritis   . Depression   . GERD (gastroesophageal reflux disease)   . Diverticulitis   . Diverticulosis   . Ovarian cyst     Past Surgical History  Procedure Laterality Date  . Carpal tunnel release Bilateral 1997  . 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 Joint replacement Bilateral 04/2010    Family History  Problem Relation Age of Onset  . Multiple myeloma Mother   . Cancer Mother   . Heart attack Father     History   Social History  . Marital Status: Married    Spouse Name: N/A    Number of Children: N/A  . Years of Education: N/A   Occupational History  . Not on file.   Social History Main Topics  . Smoking status: Current Every Day Smoker -- 0.50 packs/day    Types: Cigarettes  . Smokeless tobacco: Never Used  . Alcohol Use: 0.6 oz/week    1 Glasses of wine per week     Comment: social/rare  . Drug Use: No  . Sexual Activity: Yes    Partners: Male    Birth Control/ Protection: None     Comment: vasectomy   Other Topics Concern  . Not on file   Social History Narrative  . No narrative on file    No Known Allergies  Patient Active Problem List   Diagnosis Date Noted  . Urinary incontinence 09/18/2012  . Diverticulitis of colon (without mention of hemorrhage) 07/30/2012  . Adjustment disorder with mixed anxiety and depressed mood 07/28/2012    Results for orders placed in visit on 09/08/12  POCT  URINALYSIS DIPSTICK      Result Value Range   Color, UA       Clarity, UA       Glucose, UA n     Bilirubin, UA n     Ketones, UA n     Spec Grav, UA       Blood, UA trace     pH, UA 5.0     Protein, UA trace     Urobilinogen, UA negative     Nitrite, UA n     Leukocytes, UA Trace      No diagnosis found.  No orders of the defined types were placed in this encounter.     Review of Systems Triage Vitals: BP 130/90  Pulse 97  Temp(Src) 97.8 F (36.6 C) (Oral)  Resp 16  Ht 5' 6.75" (1.695 m)  Wt 225 lb (102.059 kg)  BMI 35.52 kg/m2  SpO2 94%  LMP 01/28/2010     Objective:   Physical Exam Physical Exam  Nursing note and vitals reviewed. Constitutional: Patient is oriented to person, place, and time. Patient appears well-developed and well-nourished. No distress.  HENT:  Head: Normocephalic and atraumatic.  Neck: Neck supple. No tracheal deviation present.  Cardiovascular: Normal rate, regular  rhythm and normal heart sounds.   No murmur heard. Pulmonary/Chest: Effort normal and breath sounds normal. No respiratory distress. Patient has no wheezes. Patient has no rales.  Musculoskeletal: Normal range of motion.  Neurological: Patient is alert and oriented to person, place, and time.  Skin: Skin is warm and dry.  Psychiatric: Patient has a normal mood and affect. Patient's behavior is normal.   Breast exam normal HEENT: unremarkable with exception of 1 mm papule on edge of right upper lid. Results for orders placed in visit on 03/09/13  POCT CBC      Result Value Range   WBC 7.3  4.6 - 10.2 K/uL   Lymph, poc 1.4  0.6 - 3.4   POC LYMPH PERCENT 18.9  10 - 50 %L   MID (cbc) 0.4  0 - 0.9   POC MID % 5.6  0 - 12 %M   POC Granulocyte 5.5  2 - 6.9   Granulocyte percent 75.5  37 - 80 %G   RBC 4.97  4.04 - 5.48 M/uL   Hemoglobin 14.8  12.2 - 16.2 g/dL   HCT, POC 16.1  09.6 - 47.9 %   MCV 94.1  80 - 97 fL   MCH, POC 29.8  27 - 31.2 pg   MCHC 31.6 (*) 31.8 - 35.4  g/dL   RDW, POC 04.5     Platelet Count, POC 276  142 - 424 K/uL   MPV 9.7  0 - 99.8 fL  POCT UA - MICROSCOPIC ONLY      Result Value Range   WBC, Ur, HPF, POC 0-1     RBC, urine, microscopic neg     Bacteria, U Microscopic neg     Mucus, UA positive     Epithelial cells, urine per micros 0-2     Crystals, Ur, HPF, POC neg     Casts, Ur, LPF, POC neg     Yeast, UA neg     Amorphous, UA pos    POCT URINALYSIS DIPSTICK      Result Value Range   Color, UA yellow     Clarity, UA clear     Glucose, UA neg     Bilirubin, UA neg     Ketones, UA neg     Spec Grav, UA 1.020     Blood, UA neg     pH, UA 7.0     Protein, UA neg     Urobilinogen, UA 0.2     Nitrite, UA neg     Leukocytes, UA Negative         Assessment & Plan:  Annual physical exam - Plan: POCT CBC, POCT UA - Microscopic Only, POCT urinalysis dipstick, Comprehensive metabolic panel, Lipid panel, TSH Patient referred to gynecology for stress incontinence Signed, Elvina Sidle, MD

## 2013-03-16 ENCOUNTER — Other Ambulatory Visit: Payer: Self-pay | Admitting: Obstetrics & Gynecology

## 2013-03-16 ENCOUNTER — Other Ambulatory Visit: Payer: Self-pay

## 2013-03-19 ENCOUNTER — Ambulatory Visit (INDEPENDENT_AMBULATORY_CARE_PROVIDER_SITE_OTHER): Payer: BC Managed Care – PPO

## 2013-03-19 ENCOUNTER — Ambulatory Visit (INDEPENDENT_AMBULATORY_CARE_PROVIDER_SITE_OTHER): Payer: BC Managed Care – PPO | Admitting: Obstetrics & Gynecology

## 2013-03-19 VITALS — BP 122/74 | HR 64 | Resp 18 | Ht 66.75 in | Wt 225.0 lb

## 2013-03-19 DIAGNOSIS — N83209 Unspecified ovarian cyst, unspecified side: Secondary | ICD-10-CM

## 2013-03-19 DIAGNOSIS — N83201 Unspecified ovarian cyst, right side: Secondary | ICD-10-CM

## 2013-03-19 NOTE — Progress Notes (Signed)
57 y.o.Marriedfemale here for a pelvic ultrasound.  H/O 4cm simple appearing right ovarian cyst.  Here for f/u.  No pain.  Still deciding about what to do for bladder/incontincne issues.  Declined pelvic PT.  Declines to see Dr. Sherron Monday again.  Saw Dr. Edward Jolly but feels like needs another opinion.  Has appt with Dr. Juliene Pina next month.  Wants my opinion.    Patient's last menstrual period was 01/28/2010.  Sexually active:  no  Contraception: abstinence  FINDINGS: UTERUS: 5.7 x 4.8 x 3.1cm with 14mm fibroid, unchanged  EMS: 1.24mm ADNEXA:   Left ovary 2.8 x 1.2 x 0.7cm   Right ovary 3.4 x 2.6  2.4cm with 23mm thin walled, avascular ovarian cyst (decreased from 40mm( CUL DE SAC: neg  D/W pt findings.  Images from today and prior u/s's reviewed.  Feel this is benign but would continue watching it.  Will repeat physical exam 6 months and then this in 1 year.  All questions answered.    Assessment:  Right 2.3cm ovarian cyst decreased in size from 4cm Plan: AEX 6 months.  PUS 1 year.  ~15 minutes spent with patient >50% of time was in face to face discussion of above.

## 2013-03-21 ENCOUNTER — Encounter: Payer: Self-pay | Admitting: Obstetrics & Gynecology

## 2013-03-21 NOTE — Patient Instructions (Signed)
Please call with any new problems/issues. 

## 2013-04-10 ENCOUNTER — Other Ambulatory Visit: Payer: Self-pay | Admitting: Obstetrics & Gynecology

## 2013-04-14 ENCOUNTER — Encounter (HOSPITAL_COMMUNITY): Payer: Self-pay | Admitting: Pharmacist

## 2013-04-16 ENCOUNTER — Telehealth: Payer: Self-pay

## 2013-04-16 ENCOUNTER — Encounter (HOSPITAL_COMMUNITY)
Admission: RE | Admit: 2013-04-16 | Discharge: 2013-04-16 | Disposition: A | Discharge status: Home / Self Care | Payer: BC Managed Care – PPO | Source: Ambulatory Visit | Attending: Obstetrics & Gynecology | Admitting: Obstetrics & Gynecology

## 2013-04-16 ENCOUNTER — Encounter (HOSPITAL_COMMUNITY): Payer: Self-pay

## 2013-04-16 DIAGNOSIS — Z01812 Encounter for preprocedural laboratory examination: Secondary | ICD-10-CM | POA: Insufficient documentation

## 2013-04-16 HISTORY — DX: Sleep apnea, unspecified: G47.30

## 2013-04-16 LAB — CBC
HCT: 39.9 % (ref 36.0–46.0)
MCH: 30.1 pg (ref 26.0–34.0)
MCHC: 33.8 g/dL (ref 30.0–36.0)
MCV: 88.9 fL (ref 78.0–100.0)
RBC: 4.49 MIL/uL (ref 3.87–5.11)
RDW: 13.6 % (ref 11.5–15.5)

## 2013-04-16 NOTE — Telephone Encounter (Signed)
Called and LMOM.  I have not per my recollection ever seen her for migraine or talked about them with her- sometimes a bad HA can be something else.  Would need to at least speak with her and find out more.  Will try back in a little while

## 2013-04-16 NOTE — Patient Instructions (Signed)
20 Jasmine [name]  04/16/2013   Your procedure is scheduled on:  04/24/13  Enter through the Main Entrance of [facility] at 1130 AM.  Pick up the phone at the desk and dial [phone].   Call this number if you have problems the morning of surgery: [phone]   Remember:   Do not eat food:After Midnight.  Do not drink clear liquids: 4 Hours before arrival.  Take these medicines the morning of surgery with A SIP OF WATER: Paxil   Do not wear jewelry, make-up or nail polish.  Do not wear lotions, powders, or perfumes. You may wear deodorant.  Do not shave 48 hours prior to surgery.  Do not bring valuables to the hospital.  [facility] is not   responsible for any belongings or valuables brought to the hospital.  Contacts, dentures or bridgework may not be worn into surgery.  Leave suitcase in the car. After surgery it may be brought to your room.  For patients admitted to the hospital, checkout time is 11:00 AM the day of              discharge.   Patients discharged the day of surgery will not be allowed to drive             home.  Name and phone number of your driver: husband   Jasmine [name]  Special Instructions:   Shower using CHG 2 nights before surgery and the night before surgery.  If you shower the day of surgery use CHG.  Use special wash - you have one bottle of CHG for all showers.  You should use approximately 1/3 of the bottle for each shower.   Please read over the following fact sheets that you were given:   Surgical Site Infection Prevention

## 2013-04-16 NOTE — Telephone Encounter (Signed)
Called her back- she has had migraines since her teens.  However she has not had them frequently recently.  She is now feeling better and does not need the suppository any more

## 2013-04-16 NOTE — Telephone Encounter (Signed)
COPLAND - Pt is having a migraine and she wants to know if you would call her in a phenergan suppository.  She uses [employer].  [phone]

## 2013-04-24 ENCOUNTER — Ambulatory Visit (HOSPITAL_COMMUNITY): Payer: BC Managed Care – PPO | Admitting: Anesthesiology

## 2013-04-24 ENCOUNTER — Encounter (HOSPITAL_COMMUNITY): Payer: BC Managed Care – PPO | Admitting: Anesthesiology

## 2013-04-24 ENCOUNTER — Ambulatory Visit (HOSPITAL_COMMUNITY)
Admission: RE | Admit: 2013-04-24 | Discharge: 2013-04-24 | Disposition: A | Discharge status: Home / Self Care | Payer: BC Managed Care – PPO | Source: Ambulatory Visit | Attending: Obstetrics & Gynecology | Admitting: Obstetrics & Gynecology

## 2013-04-24 ENCOUNTER — Encounter (HOSPITAL_COMMUNITY)
Admission: RE | Disposition: A | Discharge status: Home / Self Care | Payer: Self-pay | Source: Ambulatory Visit | Attending: Obstetrics & Gynecology

## 2013-04-24 DIAGNOSIS — R32 Unspecified urinary incontinence: Secondary | ICD-10-CM | POA: Diagnosis present

## 2013-04-24 DIAGNOSIS — N3641 Hypermobility of urethra: Secondary | ICD-10-CM | POA: Insufficient documentation

## 2013-04-24 DIAGNOSIS — N393 Stress incontinence (female) (male): Secondary | ICD-10-CM | POA: Insufficient documentation

## 2013-04-24 HISTORY — PX: BLADDER SUSPENSION: SHX72

## 2013-04-24 HISTORY — PX: CYSTOSCOPY: SHX5120

## 2013-04-24 SURGERY — URETHROPEXY, USING TRANSVAGINAL TAPE
Anesthesia: General | Site: Urethra

## 2013-04-24 MED ORDER — CEFAZOLIN SODIUM-DEXTROSE 2-3 GM-% IV SOLR: 2.0000 g | INTRAVENOUS | Status: DC

## 2013-04-24 MED ORDER — DEXAMETHASONE SODIUM PHOSPHATE 4 MG/ML IJ SOLN
INTRAMUSCULAR | Status: DC | PRN
  Administered 2013-04-24: 10 mg via INTRAVENOUS

## 2013-04-24 MED ORDER — PROPOFOL 10 MG/ML IV EMUL
INTRAVENOUS | Status: AC
  Filled 2013-04-24: qty 20

## 2013-04-24 MED ORDER — OXYCODONE-ACETAMINOPHEN 5-325 MG PO TABS: 1.0000 | ORAL_TABLET | ORAL | Status: DC | PRN

## 2013-04-24 MED ORDER — DEXAMETHASONE SODIUM PHOSPHATE 10 MG/ML IJ SOLN
INTRAMUSCULAR | Status: AC
  Filled 2013-04-24: qty 1

## 2013-04-24 MED ORDER — VASOPRESSIN 20 UNIT/ML IJ SOLN
INTRAMUSCULAR | Status: AC
  Filled 2013-04-24: qty 1

## 2013-04-24 MED ORDER — FENTANYL CITRATE 0.05 MG/ML IJ SOLN
INTRAMUSCULAR | Status: AC
  Filled 2013-04-24: qty 5

## 2013-04-24 MED ORDER — MIDAZOLAM HCL 2 MG/2ML IJ SOLN
INTRAMUSCULAR | Status: DC | PRN
  Administered 2013-04-24: 2 mg via INTRAVENOUS

## 2013-04-24 MED ORDER — LIDOCAINE HCL (CARDIAC) 20 MG/ML IV SOLN
INTRAVENOUS | Status: DC | PRN
  Administered 2013-04-24: 80 mg via INTRAVENOUS

## 2013-04-24 MED ORDER — PROPOFOL 10 MG/ML IV BOLUS
INTRAVENOUS | Status: DC | PRN
  Administered 2013-04-24: 150 mg via INTRAVENOUS
  Administered 2013-04-24: 50 mg via INTRAVENOUS

## 2013-04-24 MED ORDER — METOCLOPRAMIDE HCL 5 MG/ML IJ SOLN: 10.0000 mg | Freq: Once | INTRAMUSCULAR | Status: DC | PRN

## 2013-04-24 MED ORDER — FENTANYL CITRATE 0.05 MG/ML IJ SOLN
INTRAMUSCULAR | Status: DC | PRN
  Administered 2013-04-24 (×5): 50 ug via INTRAVENOUS

## 2013-04-24 MED ORDER — IBUPROFEN 600 MG PO TABS: 600.0000 mg | ORAL_TABLET | Freq: Four times a day (QID) | ORAL | Status: DC | PRN

## 2013-04-24 MED ORDER — ONDANSETRON HCL 4 MG/2ML IJ SOLN
INTRAMUSCULAR | Status: DC | PRN
  Administered 2013-04-24: 4 mg via INTRAVENOUS

## 2013-04-24 MED ORDER — FENTANYL CITRATE 0.05 MG/ML IJ SOLN
INTRAMUSCULAR | Status: AC
  Administered 2013-04-24: 50 ug via INTRAVENOUS
  Filled 2013-04-24: qty 2

## 2013-04-24 MED ORDER — MEPERIDINE HCL 25 MG/ML IJ SOLN: 6.2500 mg | INTRAMUSCULAR | Status: DC | PRN

## 2013-04-24 MED ORDER — KETOROLAC TROMETHAMINE 30 MG/ML IJ SOLN: 15.0000 mg | Freq: Once | INTRAMUSCULAR | Status: DC | PRN

## 2013-04-24 MED ORDER — MIDAZOLAM HCL 2 MG/2ML IJ SOLN
INTRAMUSCULAR | Status: AC
  Filled 2013-04-24: qty 2

## 2013-04-24 MED ORDER — SODIUM CHLORIDE 0.9 % IJ SOLN
INTRAMUSCULAR | Status: AC
  Filled 2013-04-24: qty 50

## 2013-04-24 MED ORDER — CEFAZOLIN SODIUM-DEXTROSE 2-3 GM-% IV SOLR
2.0000 g | INTRAVENOUS | Status: AC
  Administered 2013-04-24: 2 g via INTRAVENOUS

## 2013-04-24 MED ORDER — FENTANYL CITRATE 0.05 MG/ML IJ SOLN
25.0000 ug | INTRAMUSCULAR | Status: DC | PRN
  Administered 2013-04-24: 50 ug via INTRAVENOUS

## 2013-04-24 MED ORDER — LACTATED RINGERS IV SOLN
INTRAVENOUS | Status: DC
  Administered 2013-04-24 (×2): via INTRAVENOUS

## 2013-04-24 MED ORDER — ONDANSETRON HCL 4 MG/2ML IJ SOLN
INTRAMUSCULAR | Status: AC
  Filled 2013-04-24: qty 2

## 2013-04-24 MED ORDER — METHYLENE BLUE 1 % INJ SOLN
INTRAMUSCULAR | Status: AC
  Filled 2013-04-24: qty 10

## 2013-04-24 MED ORDER — LIDOCAINE HCL (CARDIAC) 20 MG/ML IV SOLN
INTRAVENOUS | Status: AC
  Filled 2013-04-24: qty 5

## 2013-04-24 MED ORDER — VASOPRESSIN 20 UNIT/ML IJ SOLN
INTRAVENOUS | Status: DC | PRN
  Administered 2013-04-24: 14:00:00 via INTRAMUSCULAR

## 2013-04-24 SURGICAL SUPPLY — 29 items
ADH SKN CLS APL DERMABOND .7 (GAUZE/BANDAGES/DRESSINGS) ×2
ADH SKN CLS LQ APL DERMABOND (GAUZE/BANDAGES/DRESSINGS) ×2
BAG URINE DRAINAGE (UROLOGICAL SUPPLIES) ×3 IMPLANT
BLADE SURG 15 STRL LF C SS BP (BLADE) ×4 IMPLANT
BLADE SURG 15 STRL SS (BLADE) ×6
CANISTER SUCT 3000ML (MISCELLANEOUS) ×3 IMPLANT
CATH FOLEY 2WAY SLVR  5CC 16FR (CATHETERS) ×1
CATH FOLEY 2WAY SLVR  5CC 18FR (CATHETERS) ×1
CATH FOLEY 2WAY SLVR 5CC 16FR (CATHETERS) ×2 IMPLANT
CATH FOLEY 2WAY SLVR 5CC 18FR (CATHETERS) ×2 IMPLANT
CLOTH BEACON ORANGE TIMEOUT ST (SAFETY) ×3 IMPLANT
DECANTER SPIKE VIAL GLASS SM (MISCELLANEOUS) IMPLANT
DERMABOND ADHESIVE PROPEN (GAUZE/BANDAGES/DRESSINGS) ×1
DERMABOND ADVANCED (GAUZE/BANDAGES/DRESSINGS) ×1
DERMABOND ADVANCED .7 DNX12 (GAUZE/BANDAGES/DRESSINGS) ×2 IMPLANT
DERMABOND ADVANCED .7 DNX6 (GAUZE/BANDAGES/DRESSINGS) ×1 IMPLANT
DRAPE HYSTEROSCOPY (DRAPE) ×3 IMPLANT
GLOVE BIO SURGEON STRL SZ7 (GLOVE) ×3 IMPLANT
GLOVE BIOGEL PI IND STRL 7.0 (GLOVE) ×2 IMPLANT
GLOVE BIOGEL PI INDICATOR 7.0 (GLOVE) ×1
GOWN PREVENTION PLUS LG XLONG (DISPOSABLE) ×12 IMPLANT
NS IRRIG 1000ML POUR BTL (IV SOLUTION) ×3 IMPLANT
PACK VAGINAL WOMENS (CUSTOM PROCEDURE TRAY) ×3 IMPLANT
SET CYSTO W/LG BORE CLAMP LF (SET/KITS/TRAYS/PACK) ×3 IMPLANT
SLING TVT EXACT (Sling) ×2 IMPLANT
SUT VIC AB 2-0 SH 27 (SUTURE) ×3
SUT VIC AB 2-0 SH 27XBRD (SUTURE) ×2 IMPLANT
TOWEL OR 17X24 6PK STRL BLUE (TOWEL DISPOSABLE) ×6 IMPLANT
WATER STERILE IRR 1000ML POUR (IV SOLUTION) ×3 IMPLANT

## 2013-04-24 NOTE — Anesthesia Procedure Notes (Signed)
Procedure Name: LMA Insertion Date/Time: 04/24/2013 1:20 PM Performed by: Graciela Husbands Pre-anesthesia Checklist: Patient identified, Patient being monitored, Timeout performed, Emergency Drugs available and Suction available Patient Re-evaluated:Patient Re-evaluated prior to inductionOxygen Delivery Method: Circle system utilized Preoxygenation: Pre-oxygenation with 100% oxygen Intubation Type: IV induction LMA: LMA inserted LMA Size: 4.0 Number of attempts: 1 Tube secured with: Tape Dental Injury: Teeth and Oropharynx as per pre-operative assessment

## 2013-04-24 NOTE — Transfer of Care (Signed)
Immediate Anesthesia Transfer of Care Note  Patient: Jasmine [name]  Procedure(s) Performed: Procedure(s): TRANSVAGINAL TAPE (TVT) Exact PROCEDURE (N/A) CYSTOSCOPY (N/A)  Patient Location: PACU  Anesthesia Type:General  Level of Consciousness: awake, alert  and oriented  Airway & Oxygen Therapy: Patient Spontanous Breathing and Patient connected to nasal cannula oxygen  Post-op Assessment: Report given to PACU RN and Post -op Vital signs reviewed and stable  Post vital signs: Reviewed and stable  Complications: No apparent anesthesia complications

## 2013-04-24 NOTE — Op Note (Signed)
Preoperative diagnosis: Stress urinary incontinence, low pressure urethra and urethral hypermobility  Postoperative diagnosis: stress urinary incontinence, low pressure urethra urethral hypermobility Procedure: Tension-free mid-urethral vaginal sling- (retropubic sling with TVT- Exact), Cystoscopy Surgeon: Shea Evans, MD Assistant: none Anesthesia: General, via LMA, lidocaine for local infiltration. 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 Cystoscopy noted normal bladder and urethra, no sling material seen in bladder or urethra and bilateral urine efflux noted from ureteral openings.  Indications: 57 yo menopausal female with stress urinary incontinence and low pressure urethra. Patient has been counseled on the risk and benefits of this procedure including the risk of urinary retention (and possibly needing to go home with a catheter), risk of sling erosion, immediate surgical and anaesthesia risks,  and possibly worsening or causing de-novo urge incontinence and failure of treatment reviewed. As stress incontinence is her main complaint,  patient consented to TVT sling.  Procedure: After informed consent was obtained from the patient she was taken to the operating room where general anesthesia was initiated without difficulty. Time out was carried out. Pubic symphysis was marked for the planned bilateral exit points, 2 cm lateral to the midline just above the a pubic symphysis on both sides.  A solution of 20 units Pitressin in 50 cc of normal saline was used as the injection. 20 cc of this solution was injected into the suprapubic space bilaterally with spinal needle from the planned abdominal exit points behind the pubic symphysis and into the retropubic space. This space was confirmed with the use of the hand in the vagina to feel the needle tip prior to  aspirating and injecting. A Foley catheter was then placed to drain the bladder the balloon was inflated and used to help identify the bladder and to plan the sling placement in the mid urethral position. Two Allis clamps placed 2 cm below the urethral meatus (in the area of mid urethra) on either side of the midline. 10 cc of the injecting solution injected between the 2 Allis clamps with lateral spread in the area of vaginal tunnels. A #15 blade was used to incise between the Allis clamps and with Metzenbaum scissors vaginal mucosa dissected to creat tunnels toward retropubic space under the pubic ramus in the direction of ipsilateral shoulder.  Once the planned entry routes were dissected a rigid Foley guide was placed through the foley into the bladder. Bladder neck was deviated to the left by moving the guide to patient's right thigh and right retropubic space was targeted. TVT sling plastic sleeve was mounted on blunt 3mm trocar and was inserted into the pre-dissected space and was pushed under the pubic bone, and while hugging the bone and dropping the trocar handle towards the ground it was directed to the abdominal wall, bringing it out through the premarked right exit incision. A Kelly clamp was placed on the plastic sleeve tip and the trocar was removed through the vaginal incision. The trocar was placed in the opposite TVT sling sleeve. Bladder neck was deviated to right by moving the foley with guide to the left thigh and opening up left retropubic space and the trocar was inserted into the left pre-dissected space and was pushed under the pubic bone, and while hugging the bone and dropping the trocar handle towards the ground it was directed to the abdominal wall, bringing it out through the premarked left exit incision. Kelly clamp placed on plastic  sleeve and metal trocar removed.  Cystoscopy was performed with saline for irrigation. Bladder was filled to 300 cc and evaluation was done of the  entire bladder mucosa and the urethra to ensure no sling material was eroding through the mucosa. FIndings were clear. Bilateral urine efflux noted from ureteral opening, no evidence of bladder injury noted. Cystoscope removed and bladder emptied with foley. Sling was pulled from both exit sites while leaving a Mayo scissors between sling and vaginal space to ensure "tension-free" placement. Plastic covers on sling were removed by gentle pull and excess sling cut at the abdominal exit sites.  Good hemostasis was noted. The vaginal mucosa was closed with 3-0 vicryl. Skin approximated with skin glue. The patient tolerated the procedure well. Sponge, lap and needle counts were correct x2 and the patient was taken to the recovery room in stable condition. Bladder challenge will be performed in PACU.  V.Veniamin Kincaid, MD

## 2013-04-24 NOTE — Anesthesia Postprocedure Evaluation (Signed)
  Anesthesia Post-op Note  Anesthesia Post Note  Patient: Jasmine [name]  Procedure(s) Performed: Procedure(s) (LRB): TRANSVAGINAL TAPE (TVT) Exact PROCEDURE (N/A) CYSTOSCOPY (N/A)  Anesthesia type: General  Patient location: PACU  Post pain: Pain level controlled  Post assessment: Post-op Vital signs reviewed  Last Vitals:  Filed Vitals:   04/24/13 1530  BP: 132/77  Pulse: 82  Temp:   Resp: 16    Post vital signs: Reviewed  Level of consciousness: sedated  Complications: No apparent anesthesia complications

## 2013-04-24 NOTE — H&P (Signed)
Jasmine [name] is an 57 y.o. female 57 yo menopausal female with stress urinary incontinence. Here for sling surgery. Urodynamics noted normal bladder function. No prolapse noted. No Gyn problems in past, normal Paps and mammograms.  Patient's last menstrual period was 01/28/2010.    Past Medical History  Diagnosis Date  . Anxiety   . Arthritis   . Depression   . Diverticulitis   . Diverticulosis   . Ovarian cyst   . GERD (gastroesophageal reflux disease)     tums only PRN  . Sleep apnea     Past Surgical History  Procedure Laterality Date  . Carpal tunnel release Bilateral 1997  . 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 Joint replacement Bilateral 04/2010    Family History  Problem Relation Age of Onset  . Multiple myeloma Mother   . Cancer Mother   . Heart attack Father     Social History:  reports that she has been smoking Cigarettes.  She has been smoking about 0.50 packs per day. She has never used smokeless tobacco. She reports that she drinks about 0.6 ounces of alcohol per week. She reports that she does not use illicit drugs.  Allergies: No Known Allergies  Prescriptions prior to admission  Medication Sig Dispense Refill  . PARoxetine (PAXIL) 40 MG tablet Take 40 mg by mouth Daily.      Marland Kitchen ALPRAZolam (XANAX) 0.5 MG tablet Take 1 tablet by mouth as needed.      . calcium carbonate (TUMS - DOSED IN MG ELEMENTAL CALCIUM) 500 MG chewable tablet Chew 2 tablets by mouth as needed for indigestion or heartburn.      . Calcium-Magnesium-Vitamin D (CALCIUM 1200+D3 PO) Take 1 tablet by mouth 2 (two) times daily.      Marland Kitchen docusate sodium (COLACE) 100 MG capsule Take 200 mg by mouth at bedtime.      Marland Kitchen FIBER FORMULA CAPS Take 2 capsules by mouth 2 (two) times daily.       . fish oil-omega-3 fatty acids 1000 MG capsule Take 1 g by mouth daily.       Marland Kitchen imipramine (TOFRANIL) 50 MG tablet Take 50 mg by mouth Daily.      . Multiple Vitamins-Minerals (MULTIVITAMIN WITH MINERALS)  tablet Take 1 tablet by mouth daily.          Review of Systems  Constitutional: Negative for fever.  Respiratory: Negative for shortness of breath.   Gastrointestinal: Negative for heartburn.  Genitourinary: Negative for dysuria.  Neurological: Negative for headaches.  Psychiatric/Behavioral: Negative for depression.    Blood pressure 141/78, pulse 93, temperature 97.7 F (36.5 C), temperature source Oral, resp. rate 18, height 5\' 7"  (1.702 m), weight 225 lb (102.059 kg), last menstrual period 01/28/2010, SpO2 96.00%. Physical Exam  A&O x 3, no acute distress. Pleasant HEENT neg, no thyromegaly Lungs CTA bilat CV RRR, S1S2 normal Abdo soft, non tender, non acute Extr no edema/ tenderness Pelvic normal uterus, cervix, vagina  Assessment/Plan: Genuine stress urinary incontinence with borderline urethral pressure, no overactive bladder or prolapse noted. Plan- TVT (Exact) and Cystoscopy Risks/complications of surgery reviewed incl infection, bleeding, damage to internal organs including bladder, bowels, ureters, blood vessels, other risks from anesthesia, VTE and delayed complications of any surgery, complications in future surgery reviewed. Post-op urinary retention and need for catheter for 72 hrs reviewed.    Kaniya Trueheart R 04/24/2013, 12:59 PM

## 2013-04-24 NOTE — Anesthesia Preprocedure Evaluation (Addendum)
Anesthesia Evaluation  Patient identified by MRN, date of birth, ID band Patient awake    Reviewed: Allergy & Precautions, H&P , NPO status , Patient's Chart, lab work & pertinent test results, reviewed documented beta blocker date and time   History of Anesthesia Complications Negative for: history of anesthetic complications  Airway Mallampati: II TM Distance: >3 FB Neck ROM: full    Dental  (+) Teeth Intact   Pulmonary sleep apnea (does not use CPAP - unable to tolerate) , Current Smoker (1/2 ppd x 2 years (restarted after 25 year abstinence)),  breath sounds clear to auscultation  Pulmonary exam normal       Cardiovascular Exercise Tolerance: Good negative cardio ROS  Rhythm:regular Rate:Normal     Neuro/Psych Anxiety Depression negative neurological ROS     GI/Hepatic Neg liver ROS, GERD- (tums prn)  ,Diverticulitis/osis   Endo/Other  BMI 34.5  Renal/GU negative Renal ROS Bladder dysfunction      Musculoskeletal   Abdominal   Peds  Hematology negative hematology ROS (+)   Anesthesia Other Findings   Reproductive/Obstetrics negative OB ROS                          Anesthesia Physical Anesthesia Plan  ASA: II  Anesthesia Plan: General LMA   Post-op Pain Management:    Induction:   Airway Management Planned:   Additional Equipment:   Intra-op Plan:   Post-operative Plan:   Informed Consent: I have reviewed the patients History and Physical, chart, labs and discussed the procedure including the risks, benefits and alternatives for the proposed anesthesia with the patient or authorized representative who has indicated his/her understanding and acceptance.   Dental Advisory Given  Plan Discussed with: CRNA and Surgeon  Anesthesia Plan Comments:         Anesthesia Quick Evaluation

## 2013-04-27 ENCOUNTER — Encounter (HOSPITAL_COMMUNITY): Payer: Self-pay | Admitting: Obstetrics & Gynecology

## 2013-09-17 ENCOUNTER — Ambulatory Visit (INDEPENDENT_AMBULATORY_CARE_PROVIDER_SITE_OTHER): Payer: 59 | Admitting: Obstetrics & Gynecology

## 2013-09-17 ENCOUNTER — Encounter: Payer: Self-pay | Admitting: Obstetrics & Gynecology

## 2013-09-17 VITALS — BP 104/60 | HR 84 | Resp 16 | Ht 66.5 in | Wt 215.2 lb

## 2013-09-17 DIAGNOSIS — Z0000 Encounter for general adult medical examination without abnormal findings: Secondary | ICD-10-CM

## 2013-09-17 DIAGNOSIS — Z124 Encounter for screening for malignant neoplasm of cervix: Secondary | ICD-10-CM

## 2013-09-17 DIAGNOSIS — Z01419 Encounter for gynecological examination (general) (routine) without abnormal findings: Secondary | ICD-10-CM

## 2013-09-17 DIAGNOSIS — N83209 Unspecified ovarian cyst, unspecified side: Secondary | ICD-10-CM

## 2013-09-17 DIAGNOSIS — N83201 Unspecified ovarian cyst, right side: Secondary | ICD-10-CM

## 2013-09-17 LAB — POCT URINALYSIS DIPSTICK
Bilirubin, UA: NEGATIVE
Glucose, UA: NEGATIVE
Ketones, UA: NEGATIVE
Nitrite, UA: NEGATIVE
Protein, UA: NEGATIVE
Urobilinogen, UA: NEGATIVE
pH, UA: 5

## 2013-09-17 NOTE — Progress Notes (Signed)
58 y.o. [id number] MarriedCaucasianF here for annual exam.  Doing well.  No vaginal bleeding.  Daughter, Hinton Dyer, going to [place] for one year.  Had a TVT with cystoscopy 12/14.  Very pleased with how she's done.  Doesn't leak at all!!  No vaginal bleeding.  Sees PCP in November.   Patient's last menstrual period was 01/28/2010.          Sexually active: no  The current method of family planning is vasectomy.    Exercising: no  not regularly Smoker:  Yes 1/2 PPD  Health Maintenance: Pap:  08/21/11 ASCUS/negative HR HPV History of abnormal Pap:  no MMG:  09/23/12 3D-normal, has appt scheduled for next week. Colonoscopy:  2012-repeat in 10 years with Dr Olevia Perches BMD:   Heel test years ago, plan at age 6 TDaP:  2010 Screening Labs: 11/14, Hb today: prior to surgery, Urine today: WBC-1+, RBC-trace   reports that she has been smoking Cigarettes.  She has been smoking about 0.50 packs per day. She has never used smokeless tobacco. She reports that she drinks alcohol. She reports that she does not use illicit drugs.  Past Medical History  Diagnosis Date  . Anxiety   . Arthritis   . Depression   . Diverticulitis   . Diverticulosis   . Ovarian cyst   . GERD (gastroesophageal reflux disease)     tums only PRN  . Sleep apnea     Past Surgical History  Procedure Laterality Date  . Carpal tunnel release Bilateral 1997  . 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 Joint replacement Bilateral 04/2010  . Bladder suspension N/A 04/24/2013    Procedure: TRANSVAGINAL TAPE (TVT) Exact PROCEDURE;  Surgeon: Elveria Royals, MD;  Location: [facility];  Service: Gynecology;  Laterality: N/A;  . Cystoscopy N/A 04/24/2013    Procedure: CYSTOSCOPY;  Surgeon: Elveria Royals, MD;  Location: [facility];  Service: Gynecology;  Laterality: N/A;    Current Outpatient Prescriptions  Medication Sig Dispense Refill  . ALPRAZolam (XANAX) 0.5 MG tablet Take 1 tablet by mouth as needed.      . calcium carbonate (TUMS - DOSED  IN MG ELEMENTAL CALCIUM) 500 MG chewable tablet Chew 2 tablets by mouth as needed for indigestion or heartburn.      . Calcium-Magnesium-Vitamin D (CALCIUM 1200+D3 PO) Take 1 tablet by mouth 2 (two) times daily.      Marland Kitchen docusate sodium (COLACE) 100 MG capsule Take 200 mg by mouth at bedtime.      Marland Kitchen FIBER FORMULA CAPS Take 2 capsules by mouth 2 (two) times daily.       . fish oil-omega-3 fatty acids 1000 MG capsule Take 1 g by mouth daily.       Marland Kitchen imipramine (TOFRANIL) 50 MG tablet Take 50 mg by mouth Daily.      . Multiple Vitamins-Minerals (MULTIVITAMIN WITH MINERALS) tablet Take 1 tablet by mouth daily.        Marland Kitchen PARoxetine (PAXIL) 40 MG tablet Take 40 mg by mouth Daily.      Marland Kitchen ibuprofen (ADVIL,MOTRIN) 600 MG tablet Take 1 tablet (600 mg total) by mouth every 6 (six) hours as needed.  30 tablet  0   No current facility-administered medications for this visit.    Family History  Problem Relation Age of Onset  . Multiple myeloma Mother   . Cancer Mother   . Heart attack Father     ROS:  Pertinent items are noted in HPI.  Otherwise, a  comprehensive ROS was negative.  Exam:   BP 104/60  Pulse 84  Resp 16  Ht 5' 6.5" (1.689 m)  Wt 215 lb 3.2 oz (97.614 kg)  BMI 34.22 kg/m2  LMP 01/28/2010  Weight change: -11#   Height: 5' 6.5" (168.9 cm)  Ht Readings from Last 3 Encounters:  09/17/13 5' 6.5" (1.689 m)  04/15/13 '5\' 7"'  (1.702 m)  04/15/13 '5\' 7"'  (1.702 m)    General appearance: alert, cooperative and appears stated age Head: Normocephalic, without obvious abnormality, atraumatic Neck: no adenopathy, supple, symmetrical, trachea midline and thyroid normal to inspection and palpation Lungs: clear to auscultation bilaterally Breasts: normal appearance, no masses or tenderness Heart: regular rate and rhythm Abdomen: soft, non-tender; bowel sounds normal; no masses,  no organomegaly Extremities: extremities normal, atraumatic, no cyanosis or edema Skin: Skin color, texture, turgor  normal. No rashes or lesions Lymph nodes: Cervical, supraclavicular, and axillary nodes normal. No abnormal inguinal nodes palpated Neurologic: Grossly normal   Pelvic: External genitalia:  no lesions              Urethra:  normal appearing urethra with no masses, tenderness or lesions              Bartholins and Skenes: normal                 Vagina: normal appearing vagina with normal color and discharge, no lesions              Cervix: no lesions              Pap taken: no Bimanual Exam:  Uterus:  normal size, contour, position, consistency, mobility, non-tender              Adnexa: normal adnexa and no mass, fullness, tenderness               Rectovaginal: Confirms               Anus:  normal sphincter tone, no lesions  A:  Well Woman with normal exam H/O ASCUS with neg HR HPV 4/13.   PMP, no HRT  SUI  H/O Depression  Smoker  Right ovarian, simple appearing cyst ~3cm  H/o low Vit D, normal 1 year ago.  P:   Yearly MMG.  Has scheduled in late May. No Pap today. ASCUS with neg HR HPV 2013.  Pap today Encouraged smoking cessation, she is not ready to proceed with any measures at this time.  Labs 11/14  BMD at age 72.  Repeat PUS 11/15--for 6 months follow up and if stable, f/u one year vs stopping follow-up.   An After Visit Summary was printed and given to the patient.

## 2013-09-19 ENCOUNTER — Encounter: Payer: Self-pay | Admitting: Obstetrics & Gynecology

## 2013-09-22 ENCOUNTER — Telehealth: Payer: Self-pay | Admitting: Obstetrics & Gynecology

## 2013-09-22 LAB — IPS PAP TEST WITH HPV

## 2013-09-22 NOTE — Telephone Encounter (Signed)
Routing to provider for final review. Patient agreeable to disposition. Will close encounter.     

## 2013-09-22 NOTE — Telephone Encounter (Signed)
Spoke with patient. Advised that per benefit quote received, she will be responsible for $465.96 when she comes in for PUS. Patient was agreeable.  Scheduled PUS. Advised patient of 72 hour cancellation policy and $458 cancellation fee. Patient agreeable.  Mailed the In-Office procedure form that includes appointment date and time, patient copay, and cancellation policy.

## 2013-09-22 NOTE — Telephone Encounter (Signed)
Patient is calling sabrina back °

## 2013-09-22 NOTE — Telephone Encounter (Signed)
Left message for patient to call back to schedule 02/2014 pus. Need to go over benefits also.

## 2013-11-25 ENCOUNTER — Ambulatory Visit (INDEPENDENT_AMBULATORY_CARE_PROVIDER_SITE_OTHER): Payer: 59 | Admitting: Family Medicine

## 2013-11-25 VITALS — BP 132/70 | HR 99 | Temp 98.0°F | Resp 18 | Ht 66.5 in | Wt 211.0 lb

## 2013-11-25 DIAGNOSIS — H029 Unspecified disorder of eyelid: Secondary | ICD-10-CM

## 2013-11-25 DIAGNOSIS — H209 Unspecified iridocyclitis: Secondary | ICD-10-CM

## 2013-11-25 MED ORDER — TOBRAMYCIN-DEXAMETHASONE 0.3-0.1 % OP SUSP: 1.0000 [drp] | Freq: Two times a day (BID) | OPHTHALMIC | Status: DC

## 2013-11-25 NOTE — Progress Notes (Signed)
This chart was scribed for Robyn Haber, MD by Thea Alken, ED Scribe. This patient was seen in room 8 and the patient's care was started at 7:57 PM. Subjective:    Patient ID: Jasmine [name], female    DOB: 20-May-1955, 58 y.o.   MRN: [id number]  HPI Chief Complaint  Patient presents with  . swelling under left eye    x2 days    HPI Comments: Jasmine [name] is a 58 y.o. female who presents to the [facility] complaining of left eye edema x 2 days. She reports an erythematous growth to the bottom lid margin of left eye. She reports pain with blinking 1 day ago. Pt wears glasses but denies contacts.    Past Medical History  Diagnosis Date  . Anxiety   . Arthritis   . Depression   . Diverticulitis   . Diverticulosis   . Ovarian cyst   . GERD (gastroesophageal reflux disease)     tums only PRN  . Sleep apnea    Past Surgical History  Procedure Laterality Date  . Carpal tunnel release Bilateral 1997  . 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 Joint replacement Bilateral 04/2010  . Bladder suspension N/A 04/24/2013    Procedure: TRANSVAGINAL TAPE (TVT) Exact PROCEDURE;  Surgeon: Elveria Royals, MD;  Location: [facility];  Service: Gynecology;  Laterality: N/A;  . Cystoscopy N/A 04/24/2013    Procedure: CYSTOSCOPY;  Surgeon: Elveria Royals, MD;  Location: [facility];  Service: Gynecology;  Laterality: N/A;   Prior to Admission medications   Medication Sig Start Date End Date Taking? Authorizing Provider  ALPRAZolam Duanne Moron) 0.5 MG tablet Take 1 tablet by mouth as needed. 07/03/12  Yes Historical Provider, MD  calcium carbonate (TUMS - DOSED IN MG ELEMENTAL CALCIUM) 500 MG chewable tablet Chew 2 tablets by mouth as needed for indigestion or heartburn.   Yes Historical Provider, MD  Calcium-Magnesium-Vitamin D (CALCIUM 1200+D3 PO) Take 1 tablet by mouth 2 (two) times daily.   Yes Historical Provider, MD  FIBER FORMULA CAPS Take 2 capsules by mouth 2 (two) times daily.     Yes Historical Provider, MD  fish oil-omega-3 fatty acids 1000 MG capsule Take 1 g by mouth daily.    Yes Historical Provider, MD  ibuprofen (ADVIL,MOTRIN) 600 MG tablet Take 1 tablet (600 mg total) by mouth every 6 (six) hours as needed. 04/24/13  Yes Elveria Royals, MD  imipramine (TOFRANIL) 50 MG tablet Take 50 mg by mouth Daily. 02/20/11  Yes Historical Provider, MD  Multiple Vitamins-Minerals (MULTIVITAMIN WITH MINERALS) tablet Take 1 tablet by mouth daily.     Yes Historical Provider, MD  PARoxetine (PAXIL) 40 MG tablet Take 40 mg by mouth Daily. 01/30/11  Yes Historical Provider, MD  docusate sodium (COLACE) 100 MG capsule Take 200 mg by mouth at bedtime.    Historical Provider, MD   Review of Systems  Constitutional: Negative for fever and chills.  Eyes: Positive for pain. Negative for photophobia and visual disturbance.   Objective:   Physical Exam  Nursing note and vitals reviewed. Constitutional: She is oriented to person, place, and time. She appears well-developed and well-nourished. No distress.  HENT:  Head: Normocephalic and atraumatic.  Eyes: Conjunctivae and EOM are normal.  Neck: Neck supple.  Cardiovascular: Normal rate.   Pulmonary/Chest: Effort normal.  Musculoskeletal: Normal range of motion.  Neurological: She is alert and oriented to person, place, and time.  Skin: Skin is warm  and dry.  Psychiatric: She has a normal mood and affect. Her behavior is normal.  verrucous 1-2 mm growth lower lid.  Injected limbus Negative lid eversion and flourescein staining    Assessment & Plan:   1. Iritis   2. Lesion of lower eyelid    Iritis - Plan: tobramycin-dexamethasone (TOBRADEX) ophthalmic solution  Lesion of lower eyelid - Plan: Ambulatory referral to Ophthalmology  Robyn Haber

## 2014-03-11 ENCOUNTER — Ambulatory Visit (INDEPENDENT_AMBULATORY_CARE_PROVIDER_SITE_OTHER): Payer: 59 | Admitting: Obstetrics & Gynecology

## 2014-03-11 ENCOUNTER — Ambulatory Visit (INDEPENDENT_AMBULATORY_CARE_PROVIDER_SITE_OTHER): Payer: 59

## 2014-03-11 VITALS — BP 126/84 | Ht 66.5 in | Wt 209.0 lb

## 2014-03-11 DIAGNOSIS — R8290 Unspecified abnormal findings in urine: Secondary | ICD-10-CM

## 2014-03-11 DIAGNOSIS — N83209 Unspecified ovarian cyst, unspecified side: Secondary | ICD-10-CM

## 2014-03-11 DIAGNOSIS — N8320 Unspecified ovarian cysts: Secondary | ICD-10-CM

## 2014-03-11 DIAGNOSIS — N83201 Unspecified ovarian cyst, right side: Secondary | ICD-10-CM

## 2014-03-11 NOTE — Progress Notes (Signed)
Lap BSO scheduled for 05-03-14 at 0730 at [facility]. Surgery instruction sheet reviewed and printed copy given to patient. Surgical appointments scheduled. Patient agreeable to plan.

## 2014-03-11 NOTE — Progress Notes (Signed)
58 y.o.Marriedfemale [id number] MWF here for repeat ultrasound.  Pt with hx of right ovarian cyst, smooth walled and avascular in the past.  Denies pain or other issues.  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      Patient's last menstrual period was 01/28/2010.  Sexually active:  no  Contraception: vasectomy  FINDINGS: UTERUS: 6.1 x 5.0 x 3.7cm EMS: 2.7mm ADNEXA:   Left ovary 2.0 x 0.8 x 0.8cm   Right ovary 3.6 x 3.1 x 2.9cm with 3.2 x 2.8 x 3.1cm cyst, avascular, smooth walled.  Increased from 23 x 22 x 5mm. CUL DE SAC: no free fluid  Images reviewed with pt as well as increased size of ovarian cyst.  She is ready to proceed with removal.  Incision locations, procedure, hospital stay, risks and benefits reviewed.  Pt also going to have gum surgery in January.  Feel this should be first due to having intubation with this surgery.  Risks discussed include, but are not limited to, bleeding, infection, rare risk of bowel/bladder/ureteral injury, DVT/PE, rare risk of death.  Pt aware I can remove both ovaries and tubes or just both tubes and right ovary.  I would recommend at least this to decrease ovarian cancer risk.  Pt is in agreement with this but will consider whether she is desirous of removal of both ovaries as well.  All questions answered.  Pt will look at surgery schedule with Lamont Snowball today.  Assessment:  Right ovarian cyst, enlarging  Plan: Laparoscopic RSO with possible left salpingectomy vs BSO will be planned.   ~25 minutes spent with patient >50% of time was in face to face discussion of above.

## 2014-03-12 ENCOUNTER — Encounter: Payer: Self-pay | Admitting: Obstetrics & Gynecology

## 2014-03-12 DIAGNOSIS — N83201 Unspecified ovarian cyst, right side: Secondary | ICD-10-CM | POA: Insufficient documentation

## 2014-03-13 LAB — URINE CULTURE

## 2014-03-17 ENCOUNTER — Telehealth: Payer: Self-pay | Admitting: Obstetrics & Gynecology

## 2014-03-17 NOTE — Telephone Encounter (Signed)
Routing to provider for final review. Patient agreeable to disposition. Will close encounter.     

## 2014-03-17 NOTE — Telephone Encounter (Signed)
Spoke with patient. Advised that per benefit quote received today, her policy does not show a term date to indicate that her plan will change Jan 2016. This is a calendar year plan so her deductible will start over 04/2014. Estimated patient liability for the surgeons charges is [id number]. Advised patient that she will receive separate communication from the hospital regarding fees/billing. Advised patient that per our office policy, payment is due in full at least 2 weeks prior to the scheduled surgery date. Surgery is scheduled 01.04.2016, payment is due 12.21.2015. Patient agreeable.

## 2014-03-23 ENCOUNTER — Telehealth: Payer: Self-pay | Admitting: *Deleted

## 2014-03-23 NOTE — Telephone Encounter (Signed)
Message received patient requesting to move up surgery. Call to patient to discuss options. LMTCB.

## 2014-03-24 NOTE — Telephone Encounter (Signed)
Call to patient to review surgery date options. patietn really only wanted to move up to week of 04-26-14. Advised this week is not available. Can schedule on 12-7 or possible 12-14. These will not work for patietn so will leave on 05-03-14  As scheduled.  Routing to provider for final review. Patient agreeable to disposition. Will close encounter

## 2014-03-24 NOTE — Telephone Encounter (Signed)
Returning a call to Jasmine [name]. °

## 2014-04-09 ENCOUNTER — Ambulatory Visit (INDEPENDENT_AMBULATORY_CARE_PROVIDER_SITE_OTHER): Payer: 59 | Admitting: Obstetrics & Gynecology

## 2014-04-09 VITALS — BP 122/62 | HR 60 | Resp 16 | Wt 209.8 lb

## 2014-04-09 DIAGNOSIS — N83201 Unspecified ovarian cyst, right side: Secondary | ICD-10-CM

## 2014-04-09 DIAGNOSIS — N8320 Unspecified ovarian cysts: Secondary | ICD-10-CM

## 2014-04-09 MED ORDER — HYDROCODONE-ACETAMINOPHEN 5-325 MG PO TABS: 1.0000 | ORAL_TABLET | Freq: Four times a day (QID) | ORAL | Status: DC | PRN

## 2014-04-09 NOTE — Progress Notes (Signed)
58 y.o. [name] MarriedCaucasian female here for discussion of upcoming procedure.  Bilateral Salpingo oophorectomy planned due to persistent ovarian cyst on PUS.  This was seen originally on CT scan done due to diverticulosis.  Also noted is a small calcified fibroid measuring about 1cm.  Initial PUS done 08/13/12 showing 3cm simple appearing ovarian cyst.  This has been followed for about 18 months with mild increase in size.  Pt is not interested in following any longer and would like to proceed with surgical intervention.  Laparoscopic BSO discussed and pt in agreement.  Spouse with pt today.    Procedure discussed with patient and spouse  Hospital stay, recovery and pain management all discussed.  Risks discussed including but not limited to bleeding, <1% risk of receiving a  transfusion, infection, 1-2% risk of bowel/bladder/ureteral/vascular injury discussed as well as possible need for additional surgery if injury does occur discussed.  DVT/PE and rare risk of death discussed.  My actual complications with prior surgeries discussed.  Hernia formation discussed.  Positioning and incision locations discussed.  Patient aware if pathology abnormal she may need additional treatment.  All questions answered.    Ob Hx:   Patient's last menstrual period was 01/28/2010.          Sexually active: No. Birth control: vasectomy Last pap: 09/17/13 WNL/negative HR HPV Last MMG: 09/24/13 3D-normal Tobacco: none   Past Surgical History  Procedure Laterality Date  . Carpal tunnel release Bilateral 1997  . 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 Joint replacement Bilateral 04/2010  . Bladder suspension N/A 04/24/2013    Procedure: TRANSVAGINAL TAPE (TVT) Exact PROCEDURE;  Surgeon: Elveria Royals, MD;  Location: [facility];  Service: Gynecology;  Laterality: N/A;  . Cystoscopy N/A 04/24/2013    Procedure: CYSTOSCOPY;  Surgeon: Elveria Royals, MD;  Location: [facility];  Service: Gynecology;  Laterality: N/A;     Past Medical History  Diagnosis Date  . Anxiety   . Arthritis   . Depression   . Diverticulitis   . Diverticulosis   . Ovarian cyst   . GERD (gastroesophageal reflux disease)     tums only PRN  . Sleep apnea     Allergies: Review of patient's allergies indicates no known allergies.  Current Outpatient Prescriptions  Medication Sig Dispense Refill  . ALPRAZolam (XANAX) 0.5 MG tablet Take 1 tablet by mouth as needed.    . calcium carbonate (TUMS - DOSED IN MG ELEMENTAL CALCIUM) 500 MG chewable tablet Chew 2 tablets by mouth as needed for indigestion or heartburn.    . Calcium-Magnesium-Vitamin D (CALCIUM 1200+D3 PO) Take 1 tablet by mouth 2 (two) times daily.    Marland Kitchen docusate sodium (COLACE) 100 MG capsule Take 200 mg by mouth at bedtime.    Marland Kitchen FIBER FORMULA CAPS Take 2 capsules by mouth 2 (two) times daily.     . fish oil-omega-3 fatty acids 1000 MG capsule Take 1 g by mouth daily.     Marland Kitchen ibuprofen (ADVIL,MOTRIN) 600 MG tablet Take 1 tablet (600 mg total) by mouth every 6 (six) hours as needed. 30 tablet 0  . imipramine (TOFRANIL) 50 MG tablet Take 50 mg by mouth Daily.    . Multiple Vitamins-Minerals (MULTIVITAMIN WITH MINERALS) tablet Take 1 tablet by mouth daily.      Marland Kitchen PARoxetine (PAXIL) 40 MG tablet Take 40 mg by mouth Daily.    Marland Kitchen FLUVIRIN SUSP   0   No current facility-administered medications for this visit.  ROS: A comprehensive review of systems was negative.  Exam:    BP 122/62 mmHg  Pulse 60  Resp 16  Wt 209 lb 12.8 oz (Jasmine.165 kg)  LMP 01/28/2010  General appearance: alert and cooperative Head: Normocephalic, without obvious abnormality, atraumatic Neck: no adenopathy, supple, symmetrical, trachea midline and thyroid not enlarged, symmetric, no tenderness/mass/nodules Lungs: clear to auscultation bilaterally Heart: regular rate and rhythm, S1, S2 normal, no murmur, click, rub or gallop Abdomen: soft, non-tender; bowel sounds normal; no masses,  no  organomegaly Extremities: extremities normal, atraumatic, no cyanosis or edema Skin: Skin color, texture, turgor normal. No rashes or lesions Lymph nodes: Cervical, supraclavicular, and axillary nodes normal. no inguinal nodes palpated Neurologic: Grossly normal  Pelvic: External genitalia:  no lesions              Urethra: normal appearing urethra with no masses, tenderness or lesions              Bartholins and Skenes: normal                 Vagina: normal appearing vagina with normal color and discharge, no lesions              Cervix: normal appearance              Pap taken: No.        Bimanual Exam:  Uterus:  uterus is normal size, shape, consistency and nontender                                      Adnexa:    normal adnexa in size, nontender and no masses                                      Rectovaginal: Deferred                                      Anus:  normal sphincter tone, no lesions  A: Enlarging simple ovarian cyst , right sided   Small uterine fibroid  P:  Laparoscopic BSO planned Ca-125 pending Rx for Motrin and Percocet given. Medications/Vitamins reviewed.  Pt knows needs to stop any aspirin products.  Pt will stop fish oil as well as vitamins.  ~30 minutes spent with patient >50% of time was in face to face discussion of above.

## 2014-04-15 ENCOUNTER — Other Ambulatory Visit: Payer: Self-pay | Admitting: Obstetrics & Gynecology

## 2014-04-15 ENCOUNTER — Encounter: Payer: Self-pay | Admitting: Obstetrics & Gynecology

## 2014-04-15 DIAGNOSIS — N838 Other noninflammatory disorders of ovary, fallopian tube and broad ligament: Secondary | ICD-10-CM

## 2014-04-19 ENCOUNTER — Other Ambulatory Visit (INDEPENDENT_AMBULATORY_CARE_PROVIDER_SITE_OTHER): Payer: 59

## 2014-04-19 ENCOUNTER — Telehealth: Payer: Self-pay | Admitting: Emergency Medicine

## 2014-04-19 DIAGNOSIS — N839 Noninflammatory disorder of ovary, fallopian tube and broad ligament, unspecified: Secondary | ICD-10-CM

## 2014-04-19 DIAGNOSIS — N838 Other noninflammatory disorders of ovary, fallopian tube and broad ligament: Secondary | ICD-10-CM

## 2014-04-19 NOTE — Telephone Encounter (Signed)
-----   Message from Lyman Speller, MD sent at 04/15/2014  7:28 AM EST ----- Regarding: pre op lab Would you call pt and ask if she would come back for Ca-125 before pre-op visit.  I want to make sure it is still normal.  Thanks.  No copay for lab visit.  MSM

## 2014-04-19 NOTE — Telephone Encounter (Signed)
Patient notified, she will come today at 1130 for lab draw. Routing to provider for final review. Patient agreeable to disposition. Will close encounter

## 2014-04-20 LAB — CA 125: CA 125: 7 U/mL (ref <35)

## 2014-04-28 MED ORDER — DEXTROSE 5 % IV SOLN: 2.0000 g | INTRAVENOUS | Status: DC

## 2014-04-28 NOTE — Patient Instructions (Addendum)
   Your procedure is scheduled on:  Monday, Jan 4  Enter through the [employer] [facility] at: 6 AM Pick up the phone at the desk and dial [phone] and inform us of your arrival.  Please call this number if you have any problems the morning of surgery: [phone]  Remember: Do not eat or drink after midnight: Sunday  Do not wear jewelry, make-up, or FINGER nail polish No metal in your hair or on your body. Do not wear lotions, powders, perfumes.  You may wear deodorant.  Do not bring valuables to the hospital. Contacts, dentures or bridgework may not be worn into surgery.  Patients discharged on the day of surgery will not be allowed to drive home.

## 2014-04-29 ENCOUNTER — Encounter (HOSPITAL_COMMUNITY): Payer: Self-pay

## 2014-04-29 ENCOUNTER — Telehealth: Payer: Self-pay | Admitting: Obstetrics & Gynecology

## 2014-04-29 ENCOUNTER — Encounter (HOSPITAL_COMMUNITY)
Admission: RE | Admit: 2014-04-29 | Discharge: 2014-04-29 | Disposition: A | Discharge status: Home / Self Care | Payer: 59 | Source: Ambulatory Visit | Attending: Obstetrics & Gynecology | Admitting: Obstetrics & Gynecology

## 2014-04-29 DIAGNOSIS — N8329 Other ovarian cysts: Secondary | ICD-10-CM | POA: Diagnosis present

## 2014-04-29 DIAGNOSIS — M1990 Unspecified osteoarthritis, unspecified site: Secondary | ICD-10-CM | POA: Diagnosis not present

## 2014-04-29 DIAGNOSIS — F329 Major depressive disorder, single episode, unspecified: Secondary | ICD-10-CM | POA: Diagnosis not present

## 2014-04-29 DIAGNOSIS — F17210 Nicotine dependence, cigarettes, uncomplicated: Secondary | ICD-10-CM | POA: Diagnosis not present

## 2014-04-29 DIAGNOSIS — D270 Benign neoplasm of right ovary: Secondary | ICD-10-CM | POA: Diagnosis not present

## 2014-04-29 DIAGNOSIS — K219 Gastro-esophageal reflux disease without esophagitis: Secondary | ICD-10-CM | POA: Diagnosis not present

## 2014-04-29 DIAGNOSIS — Z791 Long term (current) use of non-steroidal anti-inflammatories (NSAID): Secondary | ICD-10-CM | POA: Diagnosis not present

## 2014-04-29 DIAGNOSIS — G4730 Sleep apnea, unspecified: Secondary | ICD-10-CM | POA: Diagnosis not present

## 2014-04-29 DIAGNOSIS — K5790 Diverticulosis of intestine, part unspecified, without perforation or abscess without bleeding: Secondary | ICD-10-CM | POA: Diagnosis not present

## 2014-04-29 DIAGNOSIS — Z01812 Encounter for preprocedural laboratory examination: Secondary | ICD-10-CM | POA: Insufficient documentation

## 2014-04-29 DIAGNOSIS — Z79899 Other long term (current) drug therapy: Secondary | ICD-10-CM | POA: Diagnosis not present

## 2014-04-29 DIAGNOSIS — K660 Peritoneal adhesions (postprocedural) (postinfection): Secondary | ICD-10-CM | POA: Diagnosis not present

## 2014-04-29 LAB — CBC
HEMATOCRIT: 38.4 % (ref 36.0–46.0)
Hemoglobin: 12 g/dL (ref 12.0–15.0)
MCH: 25.2 pg — ABNORMAL LOW (ref 26.0–34.0)
MCHC: 31.3 g/dL (ref 30.0–36.0)
MCV: 80.7 fL (ref 78.0–100.0)
PLATELETS: 289 10*3/uL (ref 150–400)
RBC: 4.76 MIL/uL (ref 3.87–5.11)
RDW: 18.6 % — ABNORMAL HIGH (ref 11.5–15.5)
WBC: 6.3 10*3/uL (ref 4.0–10.5)

## 2014-04-29 NOTE — Telephone Encounter (Signed)
Jasmine [name] from [facility] called requesting more details on what type of surgery this patient will be having on Monday, 05/03/14?

## 2014-04-29 NOTE — Telephone Encounter (Signed)
Call to [facility], confirmed case should be planned as Laparoscopic BSO.  Routing to provider for final review. Patient agreeable to disposition. Will close encounter

## 2014-05-02 NOTE — Anesthesia Preprocedure Evaluation (Addendum)
Anesthesia Evaluation  Patient identified by MRN, date of birth, ID band Patient awake    Reviewed: Allergy & Precautions, H&P , NPO status , Patient's Chart, lab work & pertinent test results  History of Anesthesia Complications Negative for: history of anesthetic complications  Airway Mallampati: III  TM Distance: >3 FB Neck ROM: Full    Dental no notable dental hx. (+) Dental Advisory Given   Pulmonary sleep apnea , Current Smoker,  breath sounds clear to auscultation  Pulmonary exam normal       Cardiovascular Exercise Tolerance: Good negative cardio ROS  Rhythm:Regular Rate:Normal     Neuro/Psych PSYCHIATRIC DISORDERS Anxiety Depression negative neurological ROS  negative psych ROS   GI/Hepatic negative GI ROS, Neg liver ROS, GERD-  Medicated and Controlled,  Endo/Other  negative endocrine ROSobesity  Renal/GU negative Renal ROS  negative genitourinary   Musculoskeletal negative musculoskeletal ROS (+) Arthritis -, Osteoarthritis,    Abdominal   Peds negative pediatric ROS (+)  Hematology negative hematology ROS (+)   Anesthesia Other Findings   Reproductive/Obstetrics negative OB ROS                            Anesthesia Physical Anesthesia Plan  ASA: II  Anesthesia Plan: General   Post-op Pain Management:    Induction: Intravenous  Airway Management Planned: Oral ETT  Additional Equipment:   Intra-op Plan:   Post-operative Plan: Extubation in OR  Informed Consent: I have reviewed the patients History and Physical, chart, labs and discussed the procedure including the risks, benefits and alternatives for the proposed anesthesia with the patient or authorized representative who has indicated his/her understanding and acceptance.   Dental advisory given  Plan Discussed with: CRNA  Anesthesia Plan Comments:         Anesthesia Quick Evaluation

## 2014-05-03 ENCOUNTER — Encounter (HOSPITAL_COMMUNITY)
Admission: RE | Disposition: A | Discharge status: Home / Self Care | Payer: Self-pay | Source: Ambulatory Visit | Attending: Obstetrics & Gynecology

## 2014-05-03 ENCOUNTER — Ambulatory Visit (HOSPITAL_COMMUNITY): Payer: 59 | Admitting: Anesthesiology

## 2014-05-03 ENCOUNTER — Ambulatory Visit (HOSPITAL_COMMUNITY)
Admission: RE | Admit: 2014-05-03 | Discharge: 2014-05-03 | Disposition: A | Discharge status: Home / Self Care | Payer: 59 | Source: Ambulatory Visit | Attending: Obstetrics & Gynecology | Admitting: Obstetrics & Gynecology

## 2014-05-03 ENCOUNTER — Encounter (HOSPITAL_COMMUNITY): Payer: Self-pay | Admitting: Certified Registered Nurse Anesthetist

## 2014-05-03 DIAGNOSIS — N8320 Unspecified ovarian cysts: Secondary | ICD-10-CM

## 2014-05-03 DIAGNOSIS — Z791 Long term (current) use of non-steroidal anti-inflammatories (NSAID): Secondary | ICD-10-CM | POA: Insufficient documentation

## 2014-05-03 DIAGNOSIS — K5790 Diverticulosis of intestine, part unspecified, without perforation or abscess without bleeding: Secondary | ICD-10-CM | POA: Insufficient documentation

## 2014-05-03 DIAGNOSIS — F329 Major depressive disorder, single episode, unspecified: Secondary | ICD-10-CM | POA: Insufficient documentation

## 2014-05-03 DIAGNOSIS — F17210 Nicotine dependence, cigarettes, uncomplicated: Secondary | ICD-10-CM | POA: Insufficient documentation

## 2014-05-03 DIAGNOSIS — D270 Benign neoplasm of right ovary: Secondary | ICD-10-CM | POA: Insufficient documentation

## 2014-05-03 DIAGNOSIS — Z79899 Other long term (current) drug therapy: Secondary | ICD-10-CM | POA: Insufficient documentation

## 2014-05-03 DIAGNOSIS — G4730 Sleep apnea, unspecified: Secondary | ICD-10-CM | POA: Insufficient documentation

## 2014-05-03 DIAGNOSIS — M1990 Unspecified osteoarthritis, unspecified site: Secondary | ICD-10-CM | POA: Insufficient documentation

## 2014-05-03 DIAGNOSIS — K219 Gastro-esophageal reflux disease without esophagitis: Secondary | ICD-10-CM | POA: Insufficient documentation

## 2014-05-03 DIAGNOSIS — K660 Peritoneal adhesions (postprocedural) (postinfection): Secondary | ICD-10-CM | POA: Insufficient documentation

## 2014-05-03 HISTORY — PX: LAPAROSCOPY: SHX197

## 2014-05-03 SURGERY — LAPAROSCOPY OPERATIVE
Anesthesia: General | Site: Abdomen | Laterality: Bilateral

## 2014-05-03 MED ORDER — 0.9 % SODIUM CHLORIDE (POUR BTL) OPTIME
TOPICAL | Status: DC | PRN
  Administered 2014-05-03: 1000 mL

## 2014-05-03 MED ORDER — ROCURONIUM BROMIDE 100 MG/10ML IV SOLN
INTRAVENOUS | Status: DC | PRN
  Administered 2014-05-03: 40 mg via INTRAVENOUS
  Administered 2014-05-03 (×2): 10 mg via INTRAVENOUS

## 2014-05-03 MED ORDER — FENTANYL CITRATE 0.05 MG/ML IJ SOLN: 25.0000 ug | INTRAMUSCULAR | Status: DC | PRN

## 2014-05-03 MED ORDER — SCOPOLAMINE 1 MG/3DAYS TD PT72
MEDICATED_PATCH | TRANSDERMAL | Status: AC
  Filled 2014-05-03: qty 1

## 2014-05-03 MED ORDER — MIDAZOLAM HCL 2 MG/2ML IJ SOLN
INTRAMUSCULAR | Status: DC | PRN
  Administered 2014-05-03: 2 mg via INTRAVENOUS

## 2014-05-03 MED ORDER — DEXAMETHASONE SODIUM PHOSPHATE 10 MG/ML IJ SOLN
INTRAMUSCULAR | Status: DC | PRN
  Administered 2014-05-03: 4 mg via INTRAVENOUS

## 2014-05-03 MED ORDER — DEXAMETHASONE SODIUM PHOSPHATE 4 MG/ML IJ SOLN
INTRAMUSCULAR | Status: AC
  Filled 2014-05-03: qty 1

## 2014-05-03 MED ORDER — MIDAZOLAM HCL 2 MG/2ML IJ SOLN
INTRAMUSCULAR | Status: AC
  Filled 2014-05-03: qty 2

## 2014-05-03 MED ORDER — PHENYLEPHRINE 40 MCG/ML (10ML) SYRINGE FOR IV PUSH (FOR BLOOD PRESSURE SUPPORT)
PREFILLED_SYRINGE | INTRAVENOUS | Status: AC
  Filled 2014-05-03: qty 5

## 2014-05-03 MED ORDER — BUPIVACAINE HCL (PF) 0.25 % IJ SOLN
INTRAMUSCULAR | Status: AC
  Filled 2014-05-03: qty 30

## 2014-05-03 MED ORDER — HEPARIN SODIUM (PORCINE) 5000 UNIT/ML IJ SOLN
INTRAMUSCULAR | Status: AC
  Filled 2014-05-03: qty 1

## 2014-05-03 MED ORDER — CEFAZOLIN SODIUM-DEXTROSE 2-3 GM-% IV SOLR
2.0000 g | INTRAVENOUS | Status: AC
  Administered 2014-05-03: 2 g via INTRAVENOUS

## 2014-05-03 MED ORDER — KETOROLAC TROMETHAMINE 30 MG/ML IJ SOLN
INTRAMUSCULAR | Status: DC | PRN
  Administered 2014-05-03: 30 mg via INTRAVENOUS

## 2014-05-03 MED ORDER — BUPIVACAINE HCL (PF) 0.25 % IJ SOLN
INTRAMUSCULAR | Status: DC | PRN
  Administered 2014-05-03: 10 mL

## 2014-05-03 MED ORDER — PHENYLEPHRINE HCL 10 MG/ML IJ SOLN
INTRAMUSCULAR | Status: DC | PRN
  Administered 2014-05-03 (×2): 40 ug via INTRAVENOUS

## 2014-05-03 MED ORDER — GLYCOPYRROLATE 0.2 MG/ML IJ SOLN
INTRAMUSCULAR | Status: AC
  Filled 2014-05-03: qty 2

## 2014-05-03 MED ORDER — ONDANSETRON HCL 4 MG/2ML IJ SOLN
INTRAMUSCULAR | Status: AC
  Filled 2014-05-03: qty 2

## 2014-05-03 MED ORDER — PROPOFOL 10 MG/ML IV EMUL
INTRAVENOUS | Status: AC
  Filled 2014-05-03: qty 20

## 2014-05-03 MED ORDER — LACTATED RINGERS IR SOLN
Status: DC | PRN
  Administered 2014-05-03: 3000 mL

## 2014-05-03 MED ORDER — LACTATED RINGERS IV SOLN
INTRAVENOUS | Status: DC
  Administered 2014-05-03 (×2): via INTRAVENOUS

## 2014-05-03 MED ORDER — ACETAMINOPHEN 325 MG PO TABS: 325.0000 mg | ORAL_TABLET | ORAL | Status: DC | PRN

## 2014-05-03 MED ORDER — ONDANSETRON HCL 4 MG/2ML IJ SOLN
INTRAMUSCULAR | Status: DC | PRN
  Administered 2014-05-03: 4 mg via INTRAVENOUS

## 2014-05-03 MED ORDER — ROCURONIUM BROMIDE 100 MG/10ML IV SOLN
INTRAVENOUS | Status: AC
  Filled 2014-05-03: qty 1

## 2014-05-03 MED ORDER — HEPARIN SODIUM (PORCINE) 5000 UNIT/ML IJ SOLN
INTRAMUSCULAR | Status: DC | PRN
  Administered 2014-05-03: 5000 [IU]

## 2014-05-03 MED ORDER — ONDANSETRON HCL 4 MG/2ML IJ SOLN: 4.0000 mg | Freq: Once | INTRAMUSCULAR | Status: DC | PRN

## 2014-05-03 MED ORDER — FENTANYL CITRATE 0.05 MG/ML IJ SOLN
INTRAMUSCULAR | Status: DC | PRN
  Administered 2014-05-03 (×2): 100 ug via INTRAVENOUS
  Administered 2014-05-03: 50 ug via INTRAVENOUS

## 2014-05-03 MED ORDER — NEOSTIGMINE METHYLSULFATE 10 MG/10ML IV SOLN
INTRAVENOUS | Status: AC
  Filled 2014-05-03: qty 1

## 2014-05-03 MED ORDER — SILVER NITRATE-POT NITRATE 75-25 % EX MISC
CUTANEOUS | Status: AC
  Filled 2014-05-03: qty 1

## 2014-05-03 MED ORDER — LIDOCAINE HCL (CARDIAC) 20 MG/ML IV SOLN
INTRAVENOUS | Status: DC | PRN
  Administered 2014-05-03: 80 mg via INTRAVENOUS

## 2014-05-03 MED ORDER — KETOROLAC TROMETHAMINE 30 MG/ML IJ SOLN
INTRAMUSCULAR | Status: AC
  Filled 2014-05-03: qty 1

## 2014-05-03 MED ORDER — GLYCOPYRROLATE 0.2 MG/ML IJ SOLN
INTRAMUSCULAR | Status: DC | PRN
  Administered 2014-05-03: 0.2 mg via INTRAVENOUS

## 2014-05-03 MED ORDER — SCOPOLAMINE 1 MG/3DAYS TD PT72
1.0000 | MEDICATED_PATCH | Freq: Once | TRANSDERMAL | Status: DC
  Administered 2014-05-03: 1.5 mg via TRANSDERMAL

## 2014-05-03 MED ORDER — NEOSTIGMINE METHYLSULFATE 10 MG/10ML IV SOLN
INTRAVENOUS | Status: DC | PRN
  Administered 2014-05-03: 3 mg via INTRAVENOUS

## 2014-05-03 MED ORDER — CEFAZOLIN SODIUM-DEXTROSE 2-3 GM-% IV SOLR
INTRAVENOUS | Status: AC
  Filled 2014-05-03: qty 50

## 2014-05-03 MED ORDER — PROPOFOL 10 MG/ML IV BOLUS
INTRAVENOUS | Status: DC | PRN
  Administered 2014-05-03: 130 mg via INTRAVENOUS

## 2014-05-03 MED ORDER — FENTANYL CITRATE 0.05 MG/ML IJ SOLN
INTRAMUSCULAR | Status: AC
  Filled 2014-05-03: qty 5

## 2014-05-03 MED ORDER — ACETAMINOPHEN 160 MG/5ML PO SOLN: 325.0000 mg | ORAL | Status: DC | PRN

## 2014-05-03 MED ORDER — LIDOCAINE HCL (CARDIAC) 20 MG/ML IV SOLN
INTRAVENOUS | Status: AC
  Filled 2014-05-03: qty 5

## 2014-05-03 SURGICAL SUPPLY — 40 items
APL SKNCLS STERI-STRIP NONHPOA (GAUZE/BANDAGES/DRESSINGS)
BAG SPEC RTRVL LRG 6X4 10 (ENDOMECHANICALS) ×1
BENZOIN TINCTURE PRP APPL 2/3 (GAUZE/BANDAGES/DRESSINGS) IMPLANT
BLADE SURG 11 STRL SS (BLADE) ×3 IMPLANT
CABLE HIGH FREQUENCY MONO STRZ (ELECTRODE) ×3 IMPLANT
CATH ROBINSON RED A/P 16FR (CATHETERS) IMPLANT
CHLORAPREP W/TINT 26ML (MISCELLANEOUS) ×3 IMPLANT
CLOSURE WOUND 1/2 X4 (GAUZE/BANDAGES/DRESSINGS)
CLOSURE WOUND 1/4X4 (GAUZE/BANDAGES/DRESSINGS)
CLOTH BEACON ORANGE TIMEOUT ST (SAFETY) ×3 IMPLANT
DRSG COVADERM PLUS 2X2 (GAUZE/BANDAGES/DRESSINGS) ×2 IMPLANT
DRSG OPSITE POSTOP 3X4 (GAUZE/BANDAGES/DRESSINGS) ×2 IMPLANT
GLOVE BIO SURGEON STRL SZ 6.5 (GLOVE) ×2 IMPLANT
GLOVE BIO SURGEONS STRL SZ 6.5 (GLOVE) ×1
GLOVE BIOGEL PI IND STRL 7.0 (GLOVE) ×4 IMPLANT
GLOVE BIOGEL PI INDICATOR 7.0 (GLOVE) ×8
GLOVE ECLIPSE 6.5 STRL STRAW (GLOVE) ×8 IMPLANT
GOWN STRL REUS W/TWL LRG LVL3 (GOWN DISPOSABLE) ×3 IMPLANT
LIQUID BAND (GAUZE/BANDAGES/DRESSINGS) ×2 IMPLANT
NEEDLE INSUFFLATION 120MM (ENDOMECHANICALS) ×3 IMPLANT
PACK LAPAROSCOPY BASIN (CUSTOM PROCEDURE TRAY) ×3 IMPLANT
PAD TRENDELENBURG OR TABLE (MISCELLANEOUS) ×3 IMPLANT
POUCH SPECIMEN RETRIEVAL 10MM (ENDOMECHANICALS) ×2 IMPLANT
PROTECTOR NERVE ULNAR (MISCELLANEOUS) ×3 IMPLANT
SEALER TISSUE G2 CVD JAW 35 (ENDOMECHANICALS) ×1 IMPLANT
SEALER TISSUE G2 CVD JAW 45CM (ENDOMECHANICALS) ×2
SET IRRIG TUBING LAPAROSCOPIC (IRRIGATION / IRRIGATOR) ×2 IMPLANT
STRIP CLOSURE SKIN 1/2X4 (GAUZE/BANDAGES/DRESSINGS) IMPLANT
STRIP CLOSURE SKIN 1/4X4 (GAUZE/BANDAGES/DRESSINGS) ×1 IMPLANT
SUT VICRYL 0 UR6 27IN ABS (SUTURE) ×2 IMPLANT
SUT VICRYL 4-0 PS2 18IN ABS (SUTURE) ×3 IMPLANT
SYR 30ML LL (SYRINGE) ×3 IMPLANT
SYR TB 1ML 25GX5/8 (SYRINGE) ×2 IMPLANT
TOWEL OR 17X24 6PK STRL BLUE (TOWEL DISPOSABLE) ×6 IMPLANT
TRAY FOLEY CATH 14FR (SET/KITS/TRAYS/PACK) ×3 IMPLANT
TROCAR BALLN 12MMX100 BLUNT (TROCAR) IMPLANT
TROCAR XCEL NON-BLD 11X100MML (ENDOMECHANICALS) ×2 IMPLANT
TROCAR XCEL NON-BLD 5MMX100MML (ENDOMECHANICALS) ×6 IMPLANT
WARMER LAPAROSCOPE (MISCELLANEOUS) ×3 IMPLANT
WATER STERILE IRR 1000ML POUR (IV SOLUTION) ×3 IMPLANT

## 2014-05-03 NOTE — H&P (Signed)
Jasmine [name] is an 59 y.o. female  G3P2 MWF here for laparoscopic BSO due to persistent ovarian cyst.  Was noted originally on CT scan due diverticulitis.  3cm simple right ovarian cyst noted.  This has been watched and is gradually enlarging.  Pt has decided to proceed with surgical removal.  Risks and benefits have been discussed.  Pt has been counseled about continued monitoring.  She is here and ready to proceed.    Pertinent Gynecological History: Menses: post-menopausal Bleeding: none Contraception: none DES exposure: denies Blood transfusions: none Sexually transmitted diseases: no past history Previous GYN Procedures: none  Last mammogram: normal Date: 5/15 Last pap: normal Date: 5/15 OB History: G3, P2   Menstrual History: Patient's last menstrual period was 01/28/2010.    Past Medical History  Diagnosis Date  . Anxiety   . Arthritis   . Depression   . Diverticulitis   . Diverticulosis   . Ovarian cyst   . GERD (gastroesophageal reflux disease)     tums only PRN  . Sleep apnea     no CPAP    Past Surgical History  Procedure Laterality Date  . Carpal tunnel release Bilateral 1997  . 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 Joint replacement Bilateral 04/2010  . Bladder suspension N/A 04/24/2013    Procedure: TRANSVAGINAL TAPE (TVT) Exact PROCEDURE;  Surgeon: Elveria Royals, MD;  Location: [facility];  Service: Gynecology;  Laterality: N/A;  . Cystoscopy N/A 04/24/2013    Procedure: CYSTOSCOPY;  Surgeon: Elveria Royals, MD;  Location: [facility];  Service: Gynecology;  Laterality: N/A;    Family History  Problem Relation Age of Onset  . Multiple myeloma Mother   . Cancer Mother   . Heart attack Father     Social History:  reports that she has been smoking Cigarettes.  She has been smoking about 0.50 packs per day. She has never used smokeless tobacco. She reports that she drinks alcohol. She reports that she does not use illicit drugs.  Allergies: No Known  Allergies  Prescriptions prior to admission  Medication Sig Dispense Refill Last Dose  . PARoxetine (PAXIL) 40 MG tablet Take 40 mg by mouth Daily.   05/02/2014 at Unknown time  . ALPRAZolam (XANAX) 0.5 MG tablet Take 1 tablet by mouth as needed for sleep.    Taking  . calcium carbonate (TUMS - DOSED IN MG ELEMENTAL CALCIUM) 500 MG chewable tablet Chew 2 tablets by mouth as needed for indigestion or heartburn.   Taking  . Calcium-Magnesium-Vitamin D (CALCIUM 1200+D3 PO) Take 1 tablet by mouth 2 (two) times daily.   Taking  . docusate sodium (COLACE) 100 MG capsule Take 200 mg by mouth at bedtime.   Taking  . FIBER FORMULA CAPS Take 1 capsule by mouth 2 (two) times daily.    Taking  . fish oil-omega-3 fatty acids 1000 MG capsule Take 1 g by mouth daily.    Taking  . FLUVIRIN SUSP   0   . HYDROcodone-acetaminophen (NORCO/VICODIN) 5-325 MG per tablet Take 1-2 tablets by mouth every 6 (six) hours as needed for moderate pain or severe pain. 20 tablet 0   . ibuprofen (ADVIL,MOTRIN) 600 MG tablet Take 1 tablet (600 mg total) by mouth every 6 (six) hours as needed. 30 tablet 0 Taking  . imipramine (TOFRANIL) 50 MG tablet Take 50 mg by mouth Daily.   Taking    Review of Systems  All other systems reviewed and are negative.  Blood pressure 127/76, pulse 103, temperature 98.2 F (36.8 C), temperature source Oral, resp. rate 20, last menstrual period 01/28/2010, SpO2 98 %. Physical Exam  Constitutional: She is oriented to person, place, and time. She appears well-developed and well-nourished.  Cardiovascular: Normal rate and regular rhythm.   Respiratory: Effort normal and breath sounds normal.  GI: Soft. Bowel sounds are normal.  Neurological: She is alert and oriented to person, place, and time.  Skin: Skin is warm and dry.  Psychiatric: She has a normal mood and affect.    No results found for this or any previous visit (from the past 24 hour(s)).  No results found.  Assessment/Plan: 58  yo MWF here simple right ovarian cyst here for laparoscopic BSO.  All questions.  Pt here and ready to proceed.    Jasmine, MARY [name] 05/03/2014, 7:01 AM  

## 2014-05-03 NOTE — Discharge Instructions (Signed)
DISCHARGE INSTRUCTIONS: Laparoscopy  The following instructions have been prepared to help you care for yourself upon your return home today.  MAY TAKE IBUPROFEN (MOTRIN, ADVIL) OR ALEVE AFTER 3:00 PM FOR PAIN!!!  Wound care:  Do not get the incision wet for the first 24 hours. The incision should be kept clean and dry.  The Band-Aids or dressings may be removed the day after surgery.  Should the incision become sore, red, and swollen after the first week, check with your doctor.  Personal hygiene:  Shower the day after your procedure.  Activity and limitations:  Do NOT drive or operate any equipment today.  Do NOT lift anything more than 15 pounds for 2-3 weeks after surgery.  Do NOT rest in bed all day.  Walking is encouraged. Walk each day, starting slowly with 5-minute walks 3 or 4 times a day. Slowly increase the length of your walks.  Walk up and down stairs slowly.  Do NOT do strenuous activities, such as golfing, playing tennis, bowling, running, biking, weight lifting, gardening, mowing, or vacuuming for 2-4 weeks. Ask your doctor when it is okay to start.  Diet: Eat a light meal as desired this evening. You may resume your usual diet tomorrow.  Return to work: This is dependent on the type of work you do. For the most part you can return to a desk job within a week of surgery. If you are more active at work, please discuss this with your doctor.  What to expect after your surgery: You may have a slight burning sensation when you urinate on the first day. You may have a very small amount of blood in the urine. Expect to have a small amount of vaginal discharge/light bleeding for 1-2 weeks. It is not unusual to have abdominal soreness and bruising for up to 2 weeks. You may be tired and need more rest for about 1 week. You may experience shoulder pain for 24-72 hours. Lying flat in bed may relieve it.  Call your doctor for any of the following:  Develop a fever of  100.4 or greater  Inability to urinate 6 hours after discharge from hospital  Severe pain not relieved by pain medications  Persistent of heavy bleeding at incision site  Redness or swelling around incision site after a week  Increasing nausea or vomiting  Patient Signature________________________________________ Nurse Signature_________________________________________

## 2014-05-03 NOTE — Transfer of Care (Signed)
Immediate Anesthesia Transfer of Care Note  Patient: Jasmine [name]  Procedure(s) Performed: Procedure(s): LAPAROSCOPIC BILATERAL SALPINGO OOPHORECTOMY (Bilateral)  Patient Location: PACU  Anesthesia Type:General  Level of Consciousness: awake, alert  and oriented  Airway & Oxygen Therapy: Patient Spontanous Breathing and Patient connected to nasal cannula oxygen  Post-op Assessment: Report given to PACU RN, Post -op Vital signs reviewed and stable and Patient moving all extremities X 4  Post vital signs: Reviewed and stable  Complications: No apparent anesthesia complications

## 2014-05-03 NOTE — Anesthesia Postprocedure Evaluation (Signed)
  Anesthesia Post-op Note  Patient: Jasmine [name]  Procedure(s) Performed: Procedure(s) (LRB): LAPAROSCOPIC BILATERAL SALPINGO OOPHORECTOMY (Bilateral)  Patient Location: PACU  Anesthesia Type: General  Level of Consciousness: awake and alert   Airway and Oxygen Therapy: Patient Spontanous Breathing  Post-op Pain: mild  Post-op Assessment: Post-op Vital signs reviewed, Patient's Cardiovascular Status Stable, Respiratory Function Stable, Patent Airway and No signs of Nausea or vomiting  Last Vitals:  Filed Vitals:   05/03/14 0916  BP: 109/50  Pulse: 84  Temp: 36.7 C  Resp: 18    Post-op Vital Signs: stable   Complications: No apparent anesthesia complications

## 2014-05-03 NOTE — Anesthesia Procedure Notes (Signed)
Procedure Name: Intubation Date/Time: 05/03/2014 7:28 AM Performed by: Raenette Rover Pre-anesthesia Checklist: Patient identified, Emergency Drugs available, Suction available and Patient being monitored Patient Re-evaluated:Patient Re-evaluated prior to inductionOxygen Delivery Method: Circle system utilized Preoxygenation: Pre-oxygenation with 100% oxygen Intubation Type: IV induction Ventilation: Mask ventilation without difficulty Laryngoscope Size: Mac and 3 Grade View: Grade II Tube type: Oral Tube size: 7.0 mm Number of attempts: 1 Airway Equipment and Method: Bite block Placement Confirmation: ETT inserted through vocal cords under direct vision,  breath sounds checked- equal and bilateral,  positive ETCO2 and CO2 detector Secured at: 22 cm Tube secured with: Tape Dental Injury: Teeth and Oropharynx as per pre-operative assessment

## 2014-05-03 NOTE — Op Note (Signed)
05/03/2014  9:29 AM  PATIENT:  Jasmine [name]  58 y.o. female  PRE-OPERATIVE DIAGNOSIS:  4cm simple ovarian cyst on right, enlarging  POST-OPERATIVE DIAGNOSIS:  same  PROCEDURE:  Procedure(s): LAPAROSCOPIC BILATERAL SALPINGO OOPHORECTOMY  SURGEON:  Ellyana Crigler [name]  ASSISTANTS: Josefa Half   ANESTHESIA:   general  ESTIMATED BLOOD LOSS: 20cc  BLOOD ADMINISTERED:none   FLUIDS: 1400cc LR  UOP: 350cc clear UOP  SPECIMEN:  Pelvic washings.  Bilateral tubes and ovaries.  Left tube and ovary marked with stitch.  Right ovary and tube in three pieces.   DISPOSITION OF SPECIMEN:  PATHOLOGY  FINDINGS: adhesions of left fallopian tube to colon.  Small bowel adhesion to anterior abdominal wall, atrophic left ovary, enlarged and mobile and smooth right ovary.bowel adhesion in both RUQ and LUQ  DESCRIPTION OF OPERATION: Patient is taken to the operating room. She is placed in the supine position. She is a running IV in place. Informed consent was present on the chart. SCDs on her lower extremities and functioning properly. General endotracheal anesthesia was administered by the anesthesia staff without difficulty.  Once adequate anesthesia was confirmed the legs are placed in the low lithotomy position in [place]. Her arms were padded and placed on arm boards by sides.     Chlor prep was then used to prep the abdomen and Betadine was used to prep the inner thighs, perineum and vagina. Once 3 minutes had past the patient was draped in a normal standard fashion. The legs were lifted to the high lithotomy position. The cervix was visualized by placing a heavy weighted speculum in the posterior aspect of the vagina and using a curved Deaver retractor to the retract anteriorly. The anterior lip of the cervix was grasped with single-tooth tenaculum. A Hulka clamp was advanced through the cervical canal and attached to the anterior lip of the cervix as a means of manipulating the uterus during  the procedure. The tenaculum was removed. The speculum was removed. A Foley catheter was placed to straight drain. Clear urine was noted. Legs were lowered to the low lithotomy position and attention was turned the abdomen.   0.25% Marcaine was used anesthetize the skin beneath the umbilicus. 44mm skin incision was made.  A Veress needle was obtained. The abdomen was elevated and the needle was passed directly into the abdomen. The peritoneum was felt as a pop as it was passed with the needle. A syringe of normal saline was attached the needle and aspiration was performed. No blood or fluid was noted. Fluid was injected without difficulty and a second aspiration was performed. No fluid or blood or saline was noted. Fluid dripped easily into the needle. However with CO2 gas, high flow was present.  Needle was removed and a second attempt was made to pass Veress needle with same results.  Needle was removed again.    43mm optiview tip with laparoscope was obtained.  While elevating the abdomen, the trochar was passed with a twisting motion through the abdominal wall layers.  Intraperitoneal placement was achieved easily.  CO2 gas was applied and adequate pneumoperitoneum was noted.  Locations for LLQ and RLQ 33mm ports were chosen with transillumination of the skin. The skin was anesthetized with 0.25% Marcaine. 2mm skin incisions were made and 3mm nonbladed trocars and ports were passed directly into the abdomen. The trochars were removed. The upper abdomen was surveyed. Liver, gall bladder, and stomach edge appeared normal. The patient was placed in Trendelenburg. The pelvis was  surveyed. The left ovary was atrophic but there were adhesions of the left fallopian tube to the colon.  The right ovary was enlarged, smooth, and mobile.  Ureters were noted on each side.  Liver edge was normal.  Gall bladder was not seen.  There were three areas of small bowel adhesions to the abdominal wall, RUQ and LUQ (which were  left untouched) and the LLQ.  Pelvic washings were obtained.  This adhesion prevented the bowel from moving once Trendelenburg placement was done.    Decision was made to take down this adhesion.  Keeping close to the abdominal wall and retracting the bowel away, the adhesion was taken down sharply.  Then the fallopian tube adhesions were taken down with sharp dissection.  monopolar cautery was used for hemostasis when needed.  Once this was taken down adequately, the IP ligament was easily seen.  With IP ligament on stretch, the left IP was serially clamped, cauterized, and incised with the Gyrus.  Then the mesosalpinx was serially clamped, cauterized, and incised.  The fallopian tube was grasped very close to the uterus and clamped, cauterized, and incised.  Finally the uteroovarian pedicle was clamped, cauterized and incised, freeing the specimen.    In a similar fashion and with uterus on stretch to the left, attention was turned to the right side.  The left IP was serially clamped, cauterized, and incised with the Gyrus.  Then the mesosalpinx was serially clamped, cauterized, and incised.  The fallopian tube was grasped very close to the uterus and clamped, cauterized, and incised.  Finally the uteroovarian pedicle was clamped, cauterized and incised, freeing the specimen on the right.  Both specimens were placed in the posterior cul de sac.    A 53mm scope was obtained and passed through the LLQ port.  An endocatch bag was placed through the midline.  The specimens were placed in the bag and delivered through the midline.  The port was replaced and laparoscope was replaced.  Pelvis was irrigated.  Irrigant was removed.  No bleeding was noted.  Both ureters were seen again with normal peristalsis.    At this point, procedure was ended.  Inferior ports were removed under direct visualization.  Several deep breaths were given to try and remove all gas from the abdomen.  The midline port was removed.   Midline was closed at the fascia with a figure of eight suture of #0 Vicryl.  Skin was cleansed.  Skin incisions were closed with subcuticular stitches of #3.0 Vicryl.  Dermabond was applied.  Vaginal instruments were removed.  No bleeding was noted.  Foley was removed.  Legs were placed back in the supine position.  Sponge, lap, needle, and instrument counts were correct x 2.  Pt tolerated procedure well, was awakened from anesthesia without difficulty, was extubated and taken to the recovery room in stable condition.    COUNTS:  YES  PLAN OF CARE: Transfer to PACU

## 2014-05-04 ENCOUNTER — Encounter (HOSPITAL_COMMUNITY): Payer: Self-pay | Admitting: Obstetrics & Gynecology

## 2014-05-04 MED FILL — Heparin Sodium (Porcine) Inj 5000 Unit/ML: INTRAMUSCULAR | Qty: 1 | Status: AC

## 2014-05-17 ENCOUNTER — Ambulatory Visit (INDEPENDENT_AMBULATORY_CARE_PROVIDER_SITE_OTHER): Payer: 59 | Admitting: Obstetrics & Gynecology

## 2014-05-17 VITALS — BP 124/78 | HR 64 | Resp 16 | Wt 214.4 lb

## 2014-05-17 DIAGNOSIS — Z9889 Other specified postprocedural states: Secondary | ICD-10-CM

## 2014-05-17 NOTE — Progress Notes (Signed)
Post Operative Visit  Procedure:Laparoscopic bilateral salpingo oophorectomy Days Post-op: 2 weeks  Subjective: Doing well.  No pain.  Felt "back to normal" in two to three days.  No vaginal bleeding.  Pictures and pathology reviewed.  All questions answered.  Objective: BP 124/78 mmHg  Pulse 64  Resp 16  Wt 214 lb 6.4 oz (97.251 kg)  LMP 01/28/2010  EXAM General: alert and cooperative GI: soft, non-tender; bowel sounds normal; no masses,  no organomegaly and incision: clean, dry and intact Extremities: extremities normal, atraumatic, no cyanosis or edema Vaginal Bleeding: none  Assessment: s/p laparoscopic BSO for right cystadenofibroma.  Left pathology negative.  Cytology negative with washings.  Pt and I discussed findings.  She will proceed with routine follow-up.  May resume all normal activity after four weeks post op.  Plan: Recheck for AEX.

## 2014-05-23 ENCOUNTER — Encounter: Payer: Self-pay | Admitting: Obstetrics & Gynecology

## 2014-07-12 ENCOUNTER — Telehealth: Payer: Self-pay | Admitting: Obstetrics & Gynecology

## 2014-07-12 NOTE — Telephone Encounter (Signed)
Pt would like name of a neurologist she can go to.

## 2014-07-12 NOTE — Telephone Encounter (Signed)
Left message to call Kaitlyn at [phone]. 

## 2014-07-12 NOTE — Telephone Encounter (Signed)
Spoke with patient. Patient states "When I was 18 I was having left shoulder pain that radiated up my neck. 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 I have not had any problems since until now. I left a message for Dr.[name] on her home number also. I have been having pain in that left shoulder up to my neck again and that arm feels really weak. Both of my parents are dead and I want to rule out MS. I am not sure if they did not want to mention it to me at the time or not. I just want to make sure it is related." Advised we refer most often to [facility]. Patient is agreeable. Phone number provided. "I know Dr.[name] personally. I really value her opinion." Patient would like me to speak with Dr.[name] about provider she recommends at the practice. Advised often refer to Dr.Xu but will speak with Dr.[name] and return call. Patient is agreeable.

## 2014-07-14 NOTE — Telephone Encounter (Signed)
Spoke with pt personally this morning.  As she has not had any recent evaluation, is going to start with her PCP.  Offered to schedule appt for her.  States she will call herself.  Appreciated phone call.  Ok to close encounter.

## 2014-08-05 ENCOUNTER — Ambulatory Visit (INDEPENDENT_AMBULATORY_CARE_PROVIDER_SITE_OTHER): Payer: 59 | Admitting: Family Medicine

## 2014-08-05 VITALS — BP 112/64 | HR 102 | Temp 98.1°F | Resp 16 | Ht 67.0 in | Wt 210.0 lb

## 2014-08-05 DIAGNOSIS — K5732 Diverticulitis of large intestine without perforation or abscess without bleeding: Secondary | ICD-10-CM

## 2014-08-05 MED ORDER — AMOXICILLIN-POT CLAVULANATE 875-125 MG PO TABS: 1.0000 | ORAL_TABLET | Freq: Two times a day (BID) | ORAL | Status: DC

## 2014-08-05 NOTE — Progress Notes (Signed)
   Subjective:    Patient ID: Jasmine [name], female    DOB: [birth year], 59 y.o.   MRN: [id number]  HPI  Jasmine [name] a 59 year old Caucasian female who presented today with a 2 day history of left lower quadrant pain. She has a history of diverticulitis and feels that today's symptoms are similar as they have been in the past. Her previous episodes have never required hospitalization and have been treated outpatient with antibiotics. She has taken Ibuprofen for pain that did not seem to improve symptoms.  She does not complain of any fever, chills, or night sweats. She does note that the pain was severe last night and woke her from her sleep. She said at that time the pain was a 9/10 on a pain scale and described the pain as a "grabbing pain." She denies any associated nausea, vomiting, diarrhea, constipation.  She states that she had a colonoscopy "sometime within the last year" and that she has known diverticulosis.     Review of Systems  Constitutional: Negative for fever, chills, appetite change and fatigue.  HENT: Negative for ear pain, sinus pressure and sore throat.   Eyes: Negative for pain.  Respiratory: Negative for cough and shortness of breath.   Cardiovascular: Negative for chest pain and palpitations.  Gastrointestinal: Positive for abdominal pain (Left lower quadrant pain.). Negative for nausea, vomiting, diarrhea and constipation.  Genitourinary: Negative for dysuria and frequency.  Neurological: Negative for dizziness, light-headedness and headaches.       Objective:   Physical Exam  Constitutional: She is oriented to person, place, and time. She appears well-developed and well-nourished. No distress.  HENT:  Head: Normocephalic and atraumatic.  Right Ear: External ear normal.  Left Ear: External ear normal.  Mouth/Throat: Oropharynx is clear and moist.  Eyes: Conjunctivae are normal. Pupils are equal, round, and reactive to light. No scleral icterus.  Neck: Neck  supple.  Cardiovascular: Normal rate, regular rhythm, normal heart sounds and intact distal pulses.   No murmur heard. Pulmonary/Chest: Effort normal and breath sounds normal. She has no wheezes. She has no rales.  Abdominal: Soft. Bowel sounds are normal. She exhibits no mass. There is tenderness (Left lower quadrant tenderness.).  Lymphadenopathy:    She has no cervical adenopathy.  Neurological: She is alert and oriented to person, place, and time.  Skin: Skin is warm and dry.  Psychiatric: She has a normal mood and affect.          Assessment & Plan:  Jasmine [name] is a 59 year old Caucasian female who presented today with a 2 day history of left lower quadrant pain with a history of diverticulitis.  1. Diverticulitis -Patient has a history of diverticulitis that has not required hospitalization in the past. -Prescribed Amoxicillin-Clavulanate 875-125 mg 1 tablet two times per day for 1 week -Advised her to follow up or return if symptoms worsen (fever >101, severe nausea, diarrhea) -Advised her of the side effects of Augmentin

## 2014-08-05 NOTE — Patient Instructions (Addendum)
You will be on an antibiotics for 7 days If you have any problems with the antibiotic, let us know  If you have fever over 101, blood in your stool, vomiting- please return to clinic   Diverticulitis Diverticulitis is inflammation or infection of small pouches in your colon that form when you have a condition called diverticulosis. The pouches in your colon are called diverticula. Your colon, or large intestine, is where water is absorbed and stool is formed. Complications of diverticulitis can include:  Bleeding.  Severe infection.  Severe pain.  Perforation of your colon.  Obstruction of your colon. CAUSES  Diverticulitis is caused by bacteria. Diverticulitis happens when stool becomes trapped in diverticula. This allows bacteria to grow in the diverticula, which can lead to inflammation and infection. RISK FACTORS People with diverticulosis are at risk for diverticulitis. Eating a diet that does not include enough fiber from fruits and vegetables may make diverticulitis more likely to develop. SYMPTOMS  Symptoms of diverticulitis may include:  Abdominal pain and tenderness. The pain is normally located on the left side of the abdomen, but may occur in other areas.  Fever and chills.  Bloating.  Cramping.  Nausea.  Vomiting.  Constipation.  Diarrhea.  Blood in your stool. DIAGNOSIS  Your health care provider will ask you about your medical history and do a physical exam. You may need to have tests done because many medical conditions can cause the same symptoms as diverticulitis. Tests may include:  Blood tests.  Urine tests.  Imaging tests of the abdomen, including X-rays and CT scans. When your condition is under control, your health care provider may recommend that you have a colonoscopy. A colonoscopy can show how severe your diverticula are and whether something else is causing your symptoms. TREATMENT  Most cases of diverticulitis are mild and can be  treated at home. Treatment may include:  Taking over-the-counter pain medicines.  Following a clear liquid diet.  Taking antibiotic medicines by mouth for 7-10 days. More severe cases may be treated at a hospital. Treatment may include:  Not eating or drinking.  Taking prescription pain medicine.  Receiving antibiotic medicines through an IV tube.  Receiving fluids and nutrition through an IV tube.  Surgery. HOME CARE INSTRUCTIONS   Follow your health care provider's instructions carefully.  Follow a full liquid diet or other diet as directed by your health care provider. After your symptoms improve, your health care provider may tell you to change your diet. He or she may recommend you eat a high-fiber diet. Fruits and vegetables are good sources of fiber. Fiber makes it easier to pass stool.  Take fiber supplements or probiotics as directed by your health care provider.  Only take medicines as directed by your health care provider.  Keep all your follow-up appointments. SEEK MEDICAL CARE IF:   Your pain does not improve.  You have a hard time eating food.  Your bowel movements do not return to normal. SEEK IMMEDIATE MEDICAL CARE IF:   Your pain becomes worse.  Your symptoms do not get better.  Your symptoms suddenly get worse.  You have a fever.  You have repeated vomiting.  You have bloody or black, tarry stools. MAKE SURE YOU:   Understand these instructions.  Will watch your condition.  Will get help right away if you are not doing well or get worse. Document Released: 01/24/2005 Document Revised: 04/21/2013 Document Reviewed: 03/11/2013 [facility] Patient Information 2015 [place], [place]. This information is not intended  to replace advice given to you by your health care provider. Make sure you discuss any questions you have with your health care provider.  

## 2014-08-05 NOTE — Progress Notes (Signed)
   Subjective:    Patient ID: Jasmine [name], female    DOB: [birth year], 59 y.o.   MRN: [id number]  HPI This is a very pleasant 59 yo female who presents today with 2 day history of left sided abdominal pain. She reports this is the same pain she has had in the past with her diverticulitis. She was diagnosed by Dr. Olevia Perches and had CT confirmation. Last episode approximately 2 years ago and was successfully treated with Augmentin.    Past Medical History  Diagnosis Date  . Anxiety   . Arthritis   . Depression   . Diverticulitis   . Diverticulosis   . Ovarian cyst   . GERD (gastroesophageal reflux disease)     tums only PRN  . Sleep apnea     no CPAP   Past Surgical History  Procedure Laterality Date  . Carpal tunnel release Bilateral 1997  . 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 Joint replacement Bilateral 04/2010  . Bladder suspension N/A 04/24/2013    Procedure: TRANSVAGINAL TAPE (TVT) Exact PROCEDURE;  Surgeon: Elveria Royals, MD;  Location: [facility];  Service: Gynecology;  Laterality: N/A;  . Cystoscopy N/A 04/24/2013    Procedure: CYSTOSCOPY;  Surgeon: Elveria Royals, MD;  Location: [facility];  Service: Gynecology;  Laterality: N/A;  . Laparoscopy Bilateral 05/03/2014    Procedure: LAPAROSCOPIC BILATERAL SALPINGO OOPHORECTOMY;  Surgeon: Lyman Speller, MD;  Location: [facility];  Service: Gynecology;  Laterality: Bilateral;   History  Substance Use Topics  . Smoking status: Current Every Day Smoker -- 0.50 packs/day    Types: Cigarettes  . Smokeless tobacco: Never Used     Comment: 1/2 PPD  . Alcohol Use: Yes     Comment: 1/2 glass of wine occasionally      Review of Systems No fever/chills, no diarrhea, no constipation, last BM this morning- no mucus or blood, no nausea, no vomiting, no dysuria, no hematuria, no urinary frequency.    Objective:   Physical Exam  Constitutional: She is oriented to person, place, and time. She appears well-developed and  well-nourished.  HENT:  Head: Normocephalic and atraumatic.  Eyes: Conjunctivae are normal.  Neck: Normal range of motion. Neck supple.  Cardiovascular: Normal rate, regular rhythm and normal heart sounds.   Normal rate with auscultation  Pulmonary/Chest: Effort normal and breath sounds normal.  Abdominal: Soft. Bowel sounds are normal. She exhibits no distension and no mass. There is tenderness (mild left lower quadrant). There is no rebound and no guarding.  Musculoskeletal: Normal range of motion.  Neurological: She is alert and oriented to person, place, and time.  Skin: Skin is warm and dry.  Psychiatric: She has a normal mood and affect. Her behavior is normal. Judgment and thought content normal.  Vitals reviewed.     Assessment & Plan:  1. Diverticulitis of colon - similar symptoms in patient with known diverticulitis. Eating and drinking well with no fever. -Provided written and verbal information regarding diagnosis and treatment. - amoxicillin-clavulanate (AUGMENTIN) 875-125 MG per tablet; Take 1 tablet by mouth 2 (two) times daily.  Dispense: 14 tablet; Refill: 0 - RTC if any fever greater than 101, increased pain or vomiting. Patient verbalized understanding.   Elby Beck, FNP-BC  [facility]  08/05/2014 8:52 AM

## 2014-09-23 ENCOUNTER — Encounter: Payer: Self-pay | Admitting: Obstetrics & Gynecology

## 2014-09-23 ENCOUNTER — Ambulatory Visit (INDEPENDENT_AMBULATORY_CARE_PROVIDER_SITE_OTHER): Payer: 59 | Admitting: Obstetrics & Gynecology

## 2014-09-23 VITALS — BP 130/82 | HR 60 | Resp 16 | Ht 66.25 in | Wt 212.0 lb

## 2014-09-23 DIAGNOSIS — Z01419 Encounter for gynecological examination (general) (routine) without abnormal findings: Secondary | ICD-10-CM | POA: Diagnosis not present

## 2014-09-23 DIAGNOSIS — Z0000 Encounter for general adult medical examination without abnormal findings: Secondary | ICD-10-CM | POA: Diagnosis not present

## 2014-09-23 LAB — POCT URINALYSIS DIPSTICK
Bilirubin, UA: NEGATIVE
Blood, UA: NEGATIVE
Glucose, UA: NEGATIVE
Ketones, UA: NEGATIVE
Leukocytes, UA: NEGATIVE
NITRITE UA: NEGATIVE
PROTEIN UA: NEGATIVE
Urobilinogen, UA: NEGATIVE
pH, UA: 5.5

## 2014-09-23 NOTE — Progress Notes (Signed)
59 y.o. [id number] MarriedCaucasianF here for annual exam.  Doing well.  Children are doing well.  Son is in [place].  Daughter working with [employer] at [employer].  Very glad she did the mid-urethral sling.  Reports she has no leaking.    Patient's last menstrual period was 01/28/2010.          Sexually active: No.  The current method of family planning is vasectomy.    Exercising: No.  not regularly Smoker:  Yes 1/2 PPD  Health Maintenance: Pap:  09/17/13 WNL negative HR HPV History of abnormal Pap:  no MMG:  09/24/13 3D-normal Colonoscopy:  2010-repeat in 10 years-Dr Olevia Perches BMD:   Heel test TDaP:  2010 Screening Labs: 11/14, Hb today: 12.2 per patient when trying to donate blood, Urine today: PH-5.5   reports that she has been smoking Cigarettes.  She has been smoking about 0.50 packs per day. She has never used smokeless tobacco. She reports that she drinks alcohol. She reports that she does not use illicit drugs.  Past Medical History  Diagnosis Date  . Anxiety   . Arthritis   . Depression   . Diverticulitis   . Diverticulosis   . Ovarian cyst   . GERD (gastroesophageal reflux disease)     tums only PRN  . Sleep apnea     no CPAP    Past Surgical History  Procedure Laterality Date  . Carpal tunnel release Bilateral 1997  . 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 Joint replacement Bilateral 04/2010  . Bladder suspension N/A 04/24/2013    Procedure: TRANSVAGINAL TAPE (TVT) Exact PROCEDURE;  Surgeon: Elveria Royals, MD;  Location: [facility];  Service: Gynecology;  Laterality: N/A;  . Cystoscopy N/A 04/24/2013    Procedure: CYSTOSCOPY;  Surgeon: Elveria Royals, MD;  Location: [facility];  Service: Gynecology;  Laterality: N/A;  . Laparoscopy Bilateral 05/03/2014    Procedure: LAPAROSCOPIC BILATERAL SALPINGO OOPHORECTOMY;  Surgeon: Lyman Speller, MD;  Location: [facility];  Service: Gynecology;  Laterality: Bilateral;    Current Outpatient Prescriptions  Medication Sig  Dispense Refill  . calcium carbonate (TUMS - DOSED IN MG ELEMENTAL CALCIUM) 500 MG chewable tablet Chew 2 tablets by mouth as needed for indigestion or heartburn.    . Calcium-Magnesium-Vitamin D (CALCIUM 1200+D3 PO) Take 1 tablet by mouth 2 (two) times daily.    Marland Kitchen docusate sodium (COLACE) 100 MG capsule Take 200 mg by mouth at bedtime.    Marland Kitchen FIBER FORMULA CAPS Take 1 capsule by mouth 2 (two) times daily.     . fish oil-omega-3 fatty acids 1000 MG capsule Take 1 g by mouth daily.     Marland Kitchen imipramine (TOFRANIL) 50 MG tablet Take 50 mg by mouth Daily.    Marland Kitchen PARoxetine (PAXIL) 40 MG tablet Take 40 mg by mouth Daily.     No current facility-administered medications for this visit.    Family History  Problem Relation Age of Onset  . Multiple myeloma Mother   . Cancer Mother   . Heart attack Father     ROS:  Pertinent items are noted in HPI.  Otherwise, a comprehensive ROS was negative.  Exam:   LMP 01/28/2010      Ht Readings from Last 3 Encounters:  08/05/14 _0  (1.702 m)  04/29/14 _1  (1.702 m)  03/11/14 5' 6.5" (1.689 m)    General appearance: alert, cooperative and appears stated age Head: Normocephalic, without obvious abnormality, atraumatic Neck: no adenopathy, supple, symmetrical,  trachea midline and thyroid normal to inspection and palpation Lungs: clear to auscultation bilaterally Breasts: normal appearance, no masses or tenderness Heart: regular rate and rhythm Abdomen: soft, non-tender; bowel sounds normal; no masses,  no organomegaly Extremities: extremities normal, atraumatic, no cyanosis or edema Skin: Skin color, texture, turgor normal. No rashes or lesions Lymph nodes: Cervical, supraclavicular, and axillary nodes normal. No abnormal inguinal nodes palpated Neurologic: Grossly normal   Pelvic: External genitalia:  no lesions              Urethra:  normal appearing urethra with no masses, tenderness or lesions              Bartholins and Skenes: normal                  Vagina: normal appearing vagina with normal color and discharge, no lesions              Cervix: no lesions              Pap taken: No. Bimanual Exam:  Uterus:  normal size, contour, position, consistency, mobility, non-tender              Adnexa: normal adnexa and no mass, fullness, tenderness               Rectovaginal: Confirms               Anus:  normal sphincter tone, no lesions  Chaperone was present for exam.  A:  Well woman with normal exam H/O ASCUS with neg HR HPV 4/13 PMP, no HRT  SUI  H/O Depression  Smoker  S/P BSO 1/16  P: Mamogram yearly No Pap today. ASCUS with neg HR HPV 2013. Pap today Encouraged smoking cessation, she is not ready to proceed with any measures at this time.  Labs 11/14  BMD at age 46.  FU 1 year or prn

## 2014-10-26 ENCOUNTER — Telehealth: Payer: Self-pay | Admitting: Obstetrics & Gynecology

## 2014-10-26 NOTE — Telephone Encounter (Signed)
Patient having trouble falling asleep and staying asleep. Waking 4 times a night. Not waking for urination or other factors. Requests suggestions to help. Sees Dr Sabra Heck.

## 2014-10-26 NOTE — Telephone Encounter (Signed)
Spoke with patient. Patient states that for two weeks she has been having difficulty falling asleep. Once she is asleep she wakes up 4 or more times a night and has trouble getting back to sleep. Denies any changes in daily schedule, stressors, etc. Denies any other medical symptoms waking her up such are urinary frequency. Has been taking melatonin with no relief. "I think I may need something to get me over the hump of not being able to sleep and then I will level out." Last seen for aex with Dr.[name] on 09/23/2014. Advised I will send a message over to [place] for review and will return call with further recommendations.

## 2014-10-27 MED ORDER — TEMAZEPAM 15 MG PO CAPS: 15.0000 mg | ORAL_CAPSULE | Freq: Every evening | ORAL | Status: DC | PRN

## 2014-10-27 NOTE — Telephone Encounter (Signed)
Ok to try restoril 15 mg nightly prn insomnia.  #[id number]/0RF.  If you will print it,  I will sign it and we can fax tomorrow.  Thanks.

## 2014-10-27 NOTE — Telephone Encounter (Signed)
Rx printed and to your desk for signature tomorrow.

## 2014-10-28 NOTE — Telephone Encounter (Signed)
Spoke with patient. Advised of message as seen below from [place]. Patient is agreeable. Signed copy of Rx sent to [facility] at [phone]. Cover sheet and fax confirmation received.  Routing to provider for final review. Patient agreeable to disposition. Will close encounter.

## 2014-11-10 ENCOUNTER — Ambulatory Visit (INDEPENDENT_AMBULATORY_CARE_PROVIDER_SITE_OTHER): Payer: 59 | Admitting: Family Medicine

## 2014-11-10 VITALS — BP 128/76 | HR 77 | Temp 98.2°F | Resp 18 | Ht 67.0 in | Wt 218.0 lb

## 2014-11-10 DIAGNOSIS — R0602 Shortness of breath: Secondary | ICD-10-CM | POA: Diagnosis not present

## 2014-11-10 DIAGNOSIS — R072 Precordial pain: Secondary | ICD-10-CM | POA: Diagnosis not present

## 2014-11-10 MED ORDER — MELOXICAM 7.5 MG PO TABS: 7.5000 mg | ORAL_TABLET | Freq: Every day | ORAL | Status: DC

## 2014-11-10 NOTE — Progress Notes (Signed)
   Subjective:    Patient ID: Jasmine [name], female    DOB: 01/15/1956, 59 y.o.   MRN: [id number]  Chief Complaint  Patient presents with  . Chest Pain    started this morning, left sided, off and on  . Shortness of Breath   Medications, allergies, past medical history, surgical history, family history, social history and problem list reviewed and updated.  HPI  6 yof presents with cp and sob.   Sx started this am while seated. Had gradual onset right sided cp described as sharp. Has been positional today. When at rest and not moving upper body no cp. When moves right arm in certain position pain comes on at 8/10. No radiation. She was moving light boxes in the days leading up to yest. Having sob episodes when the cp episodes occur, these resolve within several secs.   Denies exertional cp. No assoc palps or dizziness. No assoc n/v, diaphoresis. Not assoc with meals. Denies recent travel or immobilization. Denies hx clots. Not on hrt. No unilateral leg pain or swelling.   Denies hx cad. No dm or htn. She is a smoker.   Review of Systems See HPI.     Objective:   Physical Exam  Constitutional: She is oriented to person, place, and time. She appears well-developed and well-nourished.  Non-toxic appearance. She does not have a sickly appearance. She does not appear ill. No distress.  BP 128/76 mmHg  Pulse 77  Temp(Src) 98.2 F (36.8 C)  Resp 18  Ht 5\' 7"  (1.702 m)  Wt 218 lb (98.884 kg)  BMI 34.14 kg/m2  SpO2 95%  LMP 01/28/2010   Cardiovascular: Normal rate, regular rhythm and normal heart sounds.   Pulmonary/Chest: Effort normal and breath sounds normal. She exhibits no tenderness and no bony tenderness.  Musculoskeletal:  No leg ttp or swelling.   Neurological: She is alert and oriented to person, place, and time.  Psychiatric: She has a normal mood and affect. Her speech is normal and behavior is normal.   EKG read by Dr. Brigitte Pulse Findings: Neg q waves lead I otherwise no  significant changes from prior and no findings of ischemic change.      Assessment & Plan:   Precordial pain - Plan: EKG 12-Lead, meloxicam (MOBIC) 7.5 MG tablet  SOB (shortness of breath) - Plan: EKG 12-Lead --doubt cardiac etiology with cp exacerbated with arm movements, no exertional cp, relatively unchanged ekg from 3 yrs ago, no current cp unless moves right arm  --doubt pe with neg homans, no pleuritic cp, normal vitals --likely musculoskeletal etiology --> meloxicam, heat/ice, rtc if no improvement 1-2 wks --er if cp worsens or comes on with exertion  Julieta Gutting, PA-C Physician Assistant-Certified [facility]  11/10/2014 6:58 PM

## 2014-11-10 NOTE — Patient Instructions (Signed)
Your exam and history are reassuring that the symptoms are not due to your heart or lungs.  Please take the meloxicam once daily for the next 3-4 weeks for likely muscle strain.  Heat prior to activity and ice afterward can help with the discomfort.  Please come back to see [name] if not improving in 1-2 weeks.  Go to the ER asap if chest pain worsens or does not resolve, or comes on with exertion.

## 2014-11-13 NOTE — Progress Notes (Signed)
Patient ID: Jasmine [name], female   DOB: [birth year], 59 y.o.   MRN: [id number] Reviewed documentation and EKG and agree w/ assessment and plan. Delman Cheadle, MD MPH

## 2015-01-05 ENCOUNTER — Ambulatory Visit (INDEPENDENT_AMBULATORY_CARE_PROVIDER_SITE_OTHER): Payer: 59 | Admitting: Family Medicine

## 2015-01-05 VITALS — BP 130/80 | HR 92 | Temp 97.9°F | Resp 18 | Ht 67.0 in | Wt 223.0 lb

## 2015-01-05 DIAGNOSIS — H109 Unspecified conjunctivitis: Secondary | ICD-10-CM | POA: Diagnosis not present

## 2015-01-05 DIAGNOSIS — H578 Other specified disorders of eye and adnexa: Secondary | ICD-10-CM | POA: Diagnosis not present

## 2015-01-05 DIAGNOSIS — H5789 Other specified disorders of eye and adnexa: Secondary | ICD-10-CM

## 2015-01-05 MED ORDER — ZOSTER VACCINE LIVE 19400 UNT/0.65ML ~~LOC~~ SOLR: 0.6500 mL | Freq: Once | SUBCUTANEOUS | Status: DC

## 2015-01-05 MED ORDER — TOBRAMYCIN-DEXAMETHASONE 0.3-0.1 % OP SUSP: 2.0000 [drp] | Freq: Four times a day (QID) | OPHTHALMIC | Status: DC

## 2015-01-05 NOTE — Patient Instructions (Signed)

## 2015-01-05 NOTE — Progress Notes (Signed)
 Chief Complaint:  Chief Complaint  Patient presents with  . Seborrheic Keretosis removed    Possibly infected. Removed 6 days ago  . eye redness Left    Onset 4 days    HPI: Jasmine [name] is a 59 y.o. female who reports to [facility] today complaining of irritation of her left eye after she got seb keratosis lesion removed by dermatologist. Since the removal 4 days ago, she has had dc from lower lid, fuzzy vision due to "gunk crispy dc" , No pain assocaite. Tearing and itching. She has tried eye rinse without relief from otc. She has no feers or chil or facial pain or swelling of the lids. Denies any hx of glaucoma    Past Medical History  Diagnosis Date  . Anxiety   . Arthritis   . Depression   . Diverticulitis   . Diverticulosis   . Ovarian cyst   . GERD (gastroesophageal reflux disease)     tums only PRN  . Sleep apnea     no CPAP   Past Surgical History  Procedure Laterality Date  . Carpal tunnel release Bilateral 1997  . 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 Joint replacement Bilateral 04/2010  . Bladder suspension N/A 04/24/2013    Procedure: TRANSVAGINAL TAPE (TVT) Exact PROCEDURE;  Surgeon: Elveria Royals, MD;  Location: [facility];  Service: Gynecology;  Laterality: N/A;  . Cystoscopy N/A 04/24/2013    Procedure: CYSTOSCOPY;  Surgeon: Elveria Royals, MD;  Location: [facility];  Service: Gynecology;  Laterality: N/A;  . Laparoscopy Bilateral 05/03/2014    Procedure: LAPAROSCOPIC BILATERAL SALPINGO OOPHORECTOMY;  Surgeon: Lyman Speller, MD;  Location: [facility];  Service: Gynecology;  Laterality: Bilateral;   Social History   Social History  . Marital Status: Married    Spouse Name: N/A  . Number of Children: N/A  . Years of Education: N/A   Social History Main Topics  . Smoking status: Current Every Day Smoker -- 0.50 packs/day    Types: Cigarettes  . Smokeless tobacco: Never Used     Comment: 1/2 PPD  . Alcohol Use: 0.0 oz/week    0 Standard drinks or  equivalent per week     Comment: 1 x per month  . Drug Use: No  . Sexual Activity: No     Comment: vasectomy   Other Topics Concern  . None   Social History Narrative   Family History  Problem Relation Age of Onset  . Multiple myeloma Mother   . Cancer Mother   . Heart attack Father    No Known Allergies Prior to Admission medications   Medication Sig Start Date End Date Taking? Authorizing Provider  calcium carbonate (TUMS - DOSED IN MG ELEMENTAL CALCIUM) 500 MG chewable tablet Chew 2 tablets by mouth as needed for indigestion or heartburn.   Yes Historical Provider, MD  Calcium-Magnesium-Vitamin D (CALCIUM 1200+D3 PO) Take 1 tablet by mouth 2 (two) times daily.   Yes Historical Provider, MD  docusate sodium (COLACE) 100 MG capsule Take 200 mg by mouth at bedtime.   Yes Historical Provider, MD  FIBER FORMULA CAPS Take 1 capsule by mouth 2 (two) times daily.    Yes Historical Provider, MD  fish oil-omega-3 fatty acids 1000 MG capsule Take 1 g by mouth daily.    Yes Historical Provider, MD  imipramine (TOFRANIL) 50 MG tablet Take 50 mg by mouth Daily. 02/20/11  Yes Historical Provider, MD  PARoxetine (PAXIL)  40 MG tablet Take 40 mg by mouth Daily. 01/30/11  Yes Historical Provider, MD  meloxicam (MOBIC) 7.5 MG tablet Take 1 tablet (7.5 mg total) by mouth daily. Patient not taking: Reported on 01/05/2015 11/10/14   Araceli Bouche, PA  temazepam (RESTORIL) 15 MG capsule Take 1 capsule (15 mg total) by mouth at bedtime as needed for sleep. Patient not taking: Reported on 11/10/2014 10/27/14   Megan Salon, MD     ROS: The patient denies fevers, chills, CP , SOB  All other systems have been reviewed and were otherwise negative with the exception of those mentioned in the HPI and as above.    PHYSICAL EXAM: Filed Vitals:   01/05/15 1210  BP: 130/80  Pulse: 92  Temp: 97.9 F (36.6 C)  Resp: 18   Body mass index is 34.92 kg/(m^2).   General: Alert, no acute distress HEENT:   Normocephalic, atraumatic, oropharynx patent. EOMI, PERRLA FUndoscopic exam normal  + small lower lid skin irritation  Of right eye, no warmth, no dc, no swelling , + minimal erythema of sclera Cardiovascular:  Regular rate and rhythm, no rubs murmurs or gallops.  No Carotid bruits, radial pulse intact. No pedal edema.  Respiratory: Clear to auscultation bilaterally.  No wheezes, rales, or rhonchi.  No cyanosis, no use of accessory musculature Abdominal: No organomegaly, abdomen is soft and non-tender, positive bowel sounds. No masses. Skin: No rashes. Neurologic: Facial musculature symmetric. Psychiatric: Patient acts appropriately throughout our interaction. Lymphatic: No cervical lymphadenopathy, + right clavicular lipoma Musculoskeletal: Gait intact. No edema, tenderness   LABS: Results for orders placed or performed in visit on 09/23/14  POCT Urinalysis Dipstick  Result Value Ref Range   Color, UA     Clarity, UA     Glucose, UA n    Bilirubin, UA n    Ketones, UA n    Spec Grav, UA     Blood, UA n    pH, UA 5.5    Protein, UA n    Urobilinogen, UA negative    Nitrite, UA n    ukocytes, UA Negative      EKG/XRAY:   Primary read interpreted by Dr. Marin Comment at [facility].   ASSESSMENT/PLAN: Encounter Diagnoses  Name Primary?  . Conjunctivitis of left eye Yes  . Eye irritation    She has an appt with optometrist soon and will f/u with optometrist on Friday Tobradex given with precautions Fu prn   Gross sideeffects, risk and benefits, and alternatives of medications d/w patient. Patient is aware that all medications have potential sideeffects and we are unable to predict every sideeffect or drug-drug interaction that may occur.    DO  01/05/2015 1:48 PM

## 2015-03-19 ENCOUNTER — Emergency Department (INDEPENDENT_AMBULATORY_CARE_PROVIDER_SITE_OTHER)
Admission: EM | Admit: 2015-03-19 | Discharge: 2015-03-19 | Disposition: A | Discharge status: Home / Self Care | Payer: 59 | Source: Home / Self Care | Attending: Family Medicine | Admitting: Family Medicine

## 2015-03-19 ENCOUNTER — Encounter (HOSPITAL_COMMUNITY): Payer: Self-pay | Admitting: Emergency Medicine

## 2015-03-19 DIAGNOSIS — R350 Frequency of micturition: Secondary | ICD-10-CM | POA: Diagnosis not present

## 2015-03-19 LAB — POCT URINALYSIS DIP (DEVICE)
Bilirubin Urine: NEGATIVE
GLUCOSE, UA: NEGATIVE mg/dL
Leukocytes, UA: NEGATIVE
NITRITE: NEGATIVE
PH: 6 (ref 5.0–8.0)
PROTEIN: NEGATIVE mg/dL
Specific Gravity, Urine: 1.025 (ref 1.005–1.030)
UROBILINOGEN UA: 0.2 mg/dL (ref 0.0–1.0)

## 2015-03-19 NOTE — ED Notes (Signed)
Concerned for uti symptoms, history of the same.  Onset today around 5:00 pm.

## 2015-03-19 NOTE — Discharge Instructions (Signed)
It was nice meeting you Jasmine [name]. Your urine test looks good. Not suggestive of infection. Your urine frequency might be related to bladder incontinence. Continue your imipramine. Follow up as needed.  Urinary Frequency The number of times a normal person urinates depends upon how much liquid they take in and how much liquid they are losing. If the temperature is hot and there is high humidity, then the person will sweat more and usually breathe a little more frequently. These factors decrease the amount of frequency of urination that would be considered normal. The amount you drink is easily determined, but the amount of fluid lost is sometimes more difficult to calculate.  Fluid is lost in two ways:  Sensible fluid loss is usually measured by the amount of urine that you get rid of. Losses of fluid can also occur with diarrhea.  Insensible fluid loss is more difficult to measure. It is caused by evaporation. Insensible loss of fluid occurs through breathing and sweating. It usually ranges from a little less than a quart to a little more than a quart of fluid a day. In normal temperatures and activity levels, the average person may urinate 4 to 7 times in a 24-hour period. Needing to urinate more often than that could indicate a problem. If one urinates 4 to 7 times in 24 hours and has large volumes each time, that could indicate a different problem from one who urinates 4 to 7 times a day and has small volumes. The time of urinating is also important. Most urinating should be done during the waking hours. Getting up at night to urinate frequently can indicate some problems. CAUSES  The bladder is the organ in your lower abdomen that holds urine. Like a balloon, it swells some as it fills up. Your nerves sense this and tell you it is time to head for the bathroom. There are a number of reasons that you might feel the need to urinate more often than usual. They include:  Urinary tract infection. This is  usually associated with other signs such as burning when you urinate.  In men, problems with the prostate (a walnut-size gland that is located near the tube that carries urine out of your body). There are two reasons why the prostate can cause an increased frequency of urination:  An enlarged prostate that does not let the bladder empty well. If the bladder only half empties when you urinate, then it only has half the capacity to fill before you have to urinate again.  The nerves in the bladder become more hypersensitive with an increased size of the prostate even if the bladder empties completely.  Pregnancy.  Obesity. Excess weight is more likely to cause a problem for women than for men.  Bladder stones or other bladder problems.  Caffeine.  Alcohol.  Medications. For example, drugs that help the body get rid of extra fluid (diuretics) increase urine production. Some other medicines must be taken with lots of fluids.  Muscle or nerve weakness. This might be the result of a spinal cord injury, a stroke, multiple sclerosis, or Parkinson disease.  Long-standing diabetes can decrease the sensation of the bladder. This loss of sensation makes it harder to sense the bladder needs to be emptied. Over a period of years, the bladder is stretched out by constant overfilling. This weakens the bladder muscles so that the bladder does not empty well and has less capacity to fill with new urine.  Interstitial cystitis (also called painful bladder  syndrome). This condition develops because the tissues that line the inside of the bladder are inflamed (inflammation is the body's way of reacting to injury or infection). It causes pain and frequent urination. It occurs in women more often than in men. DIAGNOSIS   To decide what might be causing your urinary frequency, your health care provider will probably:  Ask about symptoms you have noticed.  Ask about your overall health. This will include  questions about any medications you are taking.  Do a physical examination.  Order some tests. These might include:  A blood test to check for diabetes or other health issues that could be contributing to the problem.  Urine testing. This could measure the flow of urine and the pressure on the bladder.  A test of your neurological system (the brain, spinal cord, and nerves). This is the system that senses the need to urinate.  A bladder test to check whether it is emptying completely when you urinate.  Cystoscopy. This test uses a thin tube with a tiny camera on it. It offers a look inside your urethra and bladder to see if there are problems.  Imaging tests. You might be given a contrast dye and then asked to urinate. X-rays are taken to see how your bladder is working. TREATMENT  It is important for you to be evaluated to determine if the amount or frequency that you have is unusual or abnormal. If it is found to be abnormal, the cause should be determined and this can usually be found out easily. Depending upon the cause, treatment could include medication, stimulation of the nerves, or surgery. There are not too many things that you can do as an individual to change your urinary frequency. It is important that you balance the amount of fluid intake needed to compensate for your activity and the temperature. Medical problems will be diagnosed and taken care of by your physician. There is no particular bladder training such as Kegel exercises that you can do to help urinary frequency. This is an exercise that is usually recommended for people who have leaking of urine when they laugh, cough, or sneeze. HOME CARE INSTRUCTIONS   Take any medications your health care provider prescribed or suggested. Follow the directions carefully.  Practice any lifestyle changes that are recommended. These might include:  Drinking less fluid or drinking at different times of the day. If you need to urinate  often during the night, for example, you may need to stop drinking fluids early in the evening.  Cutting down on caffeine or alcohol. They both can make you need to urinate more often than normal. Caffeine is found in coffee, tea, and sodas.  Losing weight, if that is recommended.  Keep a journal or a log. You might be asked to record how much you drink and when and where you feel the need to urinate. This will also help evaluate how well the treatment provided by your physician is working. SEEK MEDICAL CARE IF:   Your need to urinate often gets worse.  You feel increased pain or irritation when you urinate.  You notice blood in your urine.  You have questions about any medications that your health care provider recommended.  You notice blood, pus, or swelling at the site of any test or treatment procedure.  You develop a fever of more than 100.48F (38.1C). SEEK IMMEDIATE MEDICAL CARE IF:  You develop a fever of more than 102.17F (38.9C).   This information is not  intended to replace advice given to you by your health care provider. Make sure you discuss any questions you have with your health care provider.   Document Released: 02/10/2009 Document Revised: 05/07/2014 Document Reviewed: 02/10/2009 Elsevier Interactive Patient Education [employer].

## 2015-03-19 NOTE — ED Provider Notes (Signed)
CSN: [id number]     Arrival date & time 03/19/15  1758 History   First MD Initiated Contact with Patient 03/19/15 1831     No chief complaint on file.  (Consider location/radiation/quality/duration/timing/severity/associated sxs/prior Treatment) The history is provided by the patient. No language interpreter was used.  Urine frequency: Started 2 hours ago. She had UTI in the past and thought she is having urine infection again.  Denies urgency but at times she will have hesitancy. No change in urine smell, but it looks darker. She denies dysuria. No flank. She feels her symptoms has stopped since she got to the urgent care.  Past Medical History  Diagnosis Date  . Anxiety   . Arthritis   . Depression   . Diverticulitis   . Diverticulosis   . Ovarian cyst   . GERD (gastroesophageal reflux disease)     tums only PRN  . Sleep apnea     no CPAP   Past Surgical History  Procedure Laterality Date  . Carpal tunnel release Bilateral 1997  . 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 Joint replacement Bilateral 04/2010  . Bladder suspension N/A 04/24/2013    Procedure: TRANSVAGINAL TAPE (TVT) Exact PROCEDURE;  Surgeon: Elveria Royals, MD;  Location: [facility];  Service: Gynecology;  Laterality: N/A;  . Cystoscopy N/A 04/24/2013    Procedure: CYSTOSCOPY;  Surgeon: Elveria Royals, MD;  Location: [facility];  Service: Gynecology;  Laterality: N/A;  . Laparoscopy Bilateral 05/03/2014    Procedure: LAPAROSCOPIC BILATERAL SALPINGO OOPHORECTOMY;  Surgeon: Lyman Speller, MD;  Location: [facility];  Service: Gynecology;  Laterality: Bilateral;   Family History  Problem Relation Age of Onset  . Multiple myeloma Mother   . Cancer Mother   . Heart attack Father    Social History  Substance Use Topics  . Smoking status: Current Every Day Smoker -- 0.50 packs/day    Types: Cigarettes  . Smokeless tobacco: Never Used     Comment: 1/2 PPD  . Alcohol Use: 0.0 oz/week    0 Standard drinks or  equivalent per week     Comment: 1 x per month   OB History    Gravida Para Term Preterm AB TAB SAB Ectopic Multiple Living   '3 2   1     2     ' Review of Systems  Respiratory: Negative.   Cardiovascular: Negative.   Genitourinary: Positive for frequency. Negative for dysuria, hematuria and difficulty urinating.  All other systems reviewed and are negative.   Allergies  Review of patient's allergies indicates no known allergies.  Home Medications   Prior to Admission medications   Medication Sig Start Date End Date Taking? Authorizing Provider  calcium carbonate (TUMS - DOSED IN MG ELEMENTAL CALCIUM) 500 MG chewable tablet Chew 2 tablets by mouth as needed for indigestion or heartburn.    Historical Provider, MD  Calcium-Magnesium-Vitamin D (CALCIUM 1200+D3 PO) Take 1 tablet by mouth 2 (two) times daily.    Historical Provider, MD  docusate sodium (COLACE) 100 MG capsule Take 200 mg by mouth at bedtime.    Historical Provider, MD  FIBER FORMULA CAPS Take 1 capsule by mouth 2 (two) times daily.     Historical Provider, MD  fish oil-omega-3 fatty acids 1000 MG capsule Take 1 g by mouth daily.     Historical Provider, MD  imipramine (TOFRANIL) 50 MG tablet Take 50 mg by mouth Daily. 02/20/11   Historical Provider, MD  PARoxetine (PAXIL) 40 MG  tablet Take 40 mg by mouth Daily. 01/30/11   Historical Provider, MD  tobramycin-dexamethasone [facility]) ophthalmic solution Place 2 drops into the left eye every 6 (six) hours. 01/05/15   Thao P Le, DO  zoster vaccine live, PF, (ZOSTAVAX) 23953 UNT/0.65ML injection Inject 19,400 Units into the skin once. 01/05/15   Thao P Le, DO   Meds Ordered and Administered this Visit  Medications - No data to display  BP 132/82 mmHg  Pulse 96  Temp(Src) 98.2 F (36.8 C) (Oral)  Resp 16  SpO2 97%  LMP 01/28/2010 No data found.   Physical Exam  Constitutional: She appears well-developed. No distress.  Cardiovascular: Normal rate, regular rhythm and  normal heart sounds.   No murmur heard. Pulmonary/Chest: Effort normal and breath sounds normal. No respiratory distress. She has no wheezes.  Abdominal: Soft. Bowel sounds are normal. She exhibits no distension and no mass. There is no tenderness.  Nursing note and vitals reviewed.   ED Course  Procedures (including critical care time)  Labs Review Labs Reviewed  POCT URINALYSIS DIP (DEVICE)    Imaging Review No results found.   Visual Acuity Review  Right Eye Distance:   Left Eye Distance:   Bilateral Distance:    Right Eye Near:   Left Eye Near:    Bilateral Near:     Urinalysis    Component Value Date/Time   COLORURINE YELLOW 04/25/2010 0945   APPEARANCEUR CLEAR 04/25/2010 0945   LABSPEC 1.025 03/19/2015 1842   PHURINE 6.0 03/19/2015 1842   GLUCOSEU NEGATIVE 03/19/2015 1842   HGBUR TRACE* 03/19/2015 1842   BILIRUBINUR NEGATIVE 03/19/2015 1842   BILIRUBINUR n 09/23/2014 1118   KETONESUR TRACE* 03/19/2015 1842   PROTEINUR NEGATIVE 03/19/2015 1842   PROTEINUR n 09/23/2014 1118   UROBILINOGEN 0.2 03/19/2015 1842   UROBILINOGEN negative 09/23/2014 1118   NITRITE NEGATIVE 03/19/2015 1842   NITRITE n 09/23/2014 1118   LEUKOCYTESUR NEGATIVE 03/19/2015 1842         MDM  No diagnosis found. Urine frequency  Her UA looks good and does not suggest UTI. 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 She is instructed to continue this as directed by her PCP. Unlikely DM as she does not have glucosuria. Keep self hydrated. Follow up as needed.    Kinnie Feil, MD 03/19/15 [phone]

## 2015-09-27 ENCOUNTER — Ambulatory Visit (HOSPITAL_COMMUNITY)
Admission: EM | Admit: 2015-09-27 | Discharge: 2015-09-27 | Disposition: A | Discharge status: Home / Self Care | Payer: 59 | Attending: Family Medicine | Admitting: Family Medicine

## 2015-09-27 ENCOUNTER — Ambulatory Visit: Payer: 59

## 2015-09-27 ENCOUNTER — Encounter (HOSPITAL_COMMUNITY): Payer: Self-pay | Admitting: Emergency Medicine

## 2015-09-27 DIAGNOSIS — F329 Major depressive disorder, single episode, unspecified: Secondary | ICD-10-CM | POA: Insufficient documentation

## 2015-09-27 DIAGNOSIS — G4730 Sleep apnea, unspecified: Secondary | ICD-10-CM | POA: Diagnosis not present

## 2015-09-27 DIAGNOSIS — K219 Gastro-esophageal reflux disease without esophagitis: Secondary | ICD-10-CM | POA: Diagnosis not present

## 2015-09-27 DIAGNOSIS — J069 Acute upper respiratory infection, unspecified: Secondary | ICD-10-CM | POA: Insufficient documentation

## 2015-09-27 DIAGNOSIS — F17210 Nicotine dependence, cigarettes, uncomplicated: Secondary | ICD-10-CM | POA: Insufficient documentation

## 2015-09-27 DIAGNOSIS — F419 Anxiety disorder, unspecified: Secondary | ICD-10-CM | POA: Insufficient documentation

## 2015-09-27 DIAGNOSIS — Z79899 Other long term (current) drug therapy: Secondary | ICD-10-CM | POA: Insufficient documentation

## 2015-09-27 DIAGNOSIS — J029 Acute pharyngitis, unspecified: Secondary | ICD-10-CM | POA: Diagnosis present

## 2015-09-27 LAB — POCT RAPID STREP A: STREPTOCOCCUS, GROUP A SCREEN (DIRECT): NEGATIVE

## 2015-09-27 MED ORDER — IPRATROPIUM BROMIDE 0.06 % NA SOLN: 2.0000 | Freq: Four times a day (QID) | NASAL | Status: DC

## 2015-09-27 MED ORDER — AMOXICILLIN 500 MG PO CAPS: 500.0000 mg | ORAL_CAPSULE | Freq: Three times a day (TID) | ORAL | Status: DC

## 2015-09-27 NOTE — ED Provider Notes (Signed)
CSN: [id number]     Arrival date & time 09/27/15  1259 History   First MD Initiated Contact with Patient 09/27/15 1307     Chief Complaint  Patient presents with  . Sore Throat   (Consider location/radiation/quality/duration/timing/severity/associated sxs/prior Treatment) Patient is a 60 y.o. female presenting with pharyngitis. The history is provided by the patient.  Sore Throat This is a new problem. The current episode started more than 2 days ago. The problem has been gradually worsening. Pertinent negatives include no chest pain, no abdominal pain, no headaches and no shortness of breath. The symptoms are aggravated by swallowing.    Past Medical History  Diagnosis Date  . Anxiety   . Arthritis   . Depression   . Diverticulitis   . Diverticulosis   . Ovarian cyst   . GERD (gastroesophageal reflux disease)     tums only PRN  . Sleep apnea     no CPAP   Past Surgical History  Procedure Laterality Date  . Carpal tunnel release Bilateral 1997  . 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 Joint replacement Bilateral 04/2010  . Bladder suspension N/A 04/24/2013    Procedure: TRANSVAGINAL TAPE (TVT) Exact PROCEDURE;  Surgeon: Elveria Royals, MD;  Location: [facility];  Service: Gynecology;  Laterality: N/A;  . Cystoscopy N/A 04/24/2013    Procedure: CYSTOSCOPY;  Surgeon: Elveria Royals, MD;  Location: [facility];  Service: Gynecology;  Laterality: N/A;  . Laparoscopy Bilateral 05/03/2014    Procedure: LAPAROSCOPIC BILATERAL SALPINGO OOPHORECTOMY;  Surgeon: Lyman Speller, MD;  Location: [facility];  Service: Gynecology;  Laterality: Bilateral;   Family History  Problem Relation Age of Onset  . Multiple myeloma Mother   . Cancer Mother   . Heart attack Father    Social History  Substance Use Topics  . Smoking status: Current Every Day Smoker -- 0.50 packs/day    Types: Cigarettes  . Smokeless tobacco: Never Used     Comment: 1/2 PPD  . Alcohol Use: 0.0 oz/week    0 Standard  drinks or equivalent per week     Comment: 1 x per month   OB History    Gravida Para Term Preterm AB TAB SAB Ectopic Multiple Living   _0 Review of Systems  Constitutional: Negative.  Negative for fever.  HENT: Positive for sore throat. Negative for congestion, postnasal drip, rhinorrhea and sinus pressure.   Respiratory: Negative.  Negative for shortness of breath.   Cardiovascular: Negative.  Negative for chest pain.  Gastrointestinal: Negative.  Negative for abdominal pain.  Neurological: Negative for headaches.  All other systems reviewed and are negative.   Allergies  Review of patient's allergies indicates no known allergies.  Home Medications   Prior to Admission medications   Medication Sig Start Date End Date Taking? Authorizing Provider  imipramine (TOFRANIL) 50 MG tablet Take 50 mg by mouth Daily. 02/20/11  Yes Historical Provider, MD  PARoxetine (PAXIL) 40 MG tablet Take 40 mg by mouth Daily. 01/30/11  Yes Historical Provider, MD  amoxicillin (AMOXIL) 500 MG capsule Take 1 capsule (500 mg total) by mouth 3 (three) times daily. 09/27/15   Billy Fischer, MD  calcium carbonate (TUMS - DOSED IN MG ELEMENTAL CALCIUM) 500 MG chewable tablet Chew 2 tablets by mouth as needed for indigestion or heartburn.    Historical Provider, MD  Calcium-Magnesium-Vitamin D (CALCIUM 1200+D3 PO) Take 1 tablet by mouth  2 (two) times daily.    Historical Provider, MD  docusate sodium (COLACE) 100 MG capsule Take 200 mg by mouth at bedtime.    Historical Provider, MD  FIBER FORMULA CAPS Take 1 capsule by mouth 2 (two) times daily.     Historical Provider, MD  fish oil-omega-3 fatty acids 1000 MG capsule Take 1 g by mouth daily.     Historical Provider, MD  ipratropium (ATROVENT) 0.06 % nasal spray Place 2 sprays into both nostrils 4 (four) times daily. 09/27/15   Billy Fischer, MD  tobramycin-dexamethasone [facility]) ophthalmic solution Place 2 drops into the left eye every 6 (six)  hours. 01/05/15   Thao P Le, DO  zoster vaccine live, PF, (ZOSTAVAX) 58251 UNT/0.65ML injection Inject 19,400 Units into the skin once. 01/05/15   Thao P Le, DO   Meds Ordered and Administered this Visit  Medications - No data to display  BP 146/75 mmHg  Pulse 104  Temp(Src) 98.1 F (36.7 C) (Oral)  SpO2 98%  LMP 01/28/2010 No data found.   Physical Exam  Constitutional: She is oriented to person, place, and time. She appears well-developed and well-nourished. No distress.  HENT:  Head: Normocephalic.  Right Ear: External ear normal.  Left Ear: External ear normal.  Mouth/Throat: Oropharynx is clear and moist.  Neck: Normal range of motion. Neck supple.  Cardiovascular: Normal rate, regular rhythm, normal heart sounds and intact distal pulses.   Pulmonary/Chest: Effort normal and breath sounds normal.  Lymphadenopathy:    She has no cervical adenopathy.  Neurological: She is alert and oriented to person, place, and time.  Skin: Skin is warm and dry.  Nursing note and vitals reviewed.   ED Course  Procedures (including critical care time)  Labs Review Labs Reviewed  POCT RAPID STREP A   Strep neg. Imaging Review No results found.   Visual Acuity Review  Right Eye Distance:   Left Eye Distance:   Bilateral Distance:    Right Eye Near:   Left Eye Near:    Bilateral Near:         MDM   1. URI (upper respiratory infection)        Billy Fischer, MD 09/27/15 [phone]

## 2015-09-27 NOTE — Discharge Instructions (Signed)
Drink plenty of fluids as discussed, use medicine as prescribed, and mucinex or delsym for cough. Return or see your doctor if further problems °

## 2015-09-27 NOTE — ED Notes (Signed)
Pt has been suffering from a sore throat since Friday.  She denies any other symptoms, including no fever.

## 2015-09-30 LAB — CULTURE, GROUP A STREP (THRC)

## 2015-10-03 ENCOUNTER — Ambulatory Visit: Payer: 59 | Admitting: Obstetrics & Gynecology

## 2015-10-25 ENCOUNTER — Encounter: Payer: Self-pay | Admitting: Obstetrics & Gynecology

## 2015-11-07 ENCOUNTER — Encounter: Payer: Self-pay | Admitting: Obstetrics & Gynecology

## 2015-11-07 ENCOUNTER — Ambulatory Visit (INDEPENDENT_AMBULATORY_CARE_PROVIDER_SITE_OTHER): Payer: 59 | Admitting: Obstetrics & Gynecology

## 2015-11-07 VITALS — BP 126/84 | HR 74 | Resp 18 | Ht 67.0 in | Wt 230.6 lb

## 2015-11-07 DIAGNOSIS — Z124 Encounter for screening for malignant neoplasm of cervix: Secondary | ICD-10-CM | POA: Diagnosis not present

## 2015-11-07 DIAGNOSIS — Z0000 Encounter for general adult medical examination without abnormal findings: Secondary | ICD-10-CM

## 2015-11-07 DIAGNOSIS — Z01419 Encounter for gynecological examination (general) (routine) without abnormal findings: Secondary | ICD-10-CM | POA: Diagnosis not present

## 2015-11-07 LAB — COMPREHENSIVE METABOLIC PANEL
ALBUMIN: 4 g/dL (ref 3.6–5.1)
ALK PHOS: 130 U/L (ref 33–130)
ALT: 22 U/L (ref 6–29)
AST: 19 U/L (ref 10–35)
BILIRUBIN TOTAL: 0.3 mg/dL (ref 0.2–1.2)
BUN: 16 mg/dL (ref 7–25)
CALCIUM: 9.3 mg/dL (ref 8.6–10.4)
CO2: 29 mmol/L (ref 20–31)
Chloride: 100 mmol/L (ref 98–110)
Creat: 0.91 mg/dL (ref 0.50–0.99)
Glucose, Bld: 63 mg/dL — ABNORMAL LOW (ref 65–99)
POTASSIUM: 4 mmol/L (ref 3.5–5.3)
Sodium: 137 mmol/L (ref 135–146)
TOTAL PROTEIN: 6.8 g/dL (ref 6.1–8.1)

## 2015-11-07 LAB — CBC
HEMATOCRIT: 38.6 % (ref 35.0–45.0)
HEMOGLOBIN: 12.3 g/dL (ref 11.7–15.5)
MCH: 25.6 pg — ABNORMAL LOW (ref 27.0–33.0)
MCHC: 31.9 g/dL — ABNORMAL LOW (ref 32.0–36.0)
MCV: 80.2 fL (ref 80.0–100.0)
MPV: 9.8 fL (ref 7.5–12.5)
Platelets: 357 10*3/uL (ref 140–400)
RBC: 4.81 MIL/uL (ref 3.80–5.10)
RDW: 17.1 % — ABNORMAL HIGH (ref 11.0–15.0)
WBC: 7.4 10*3/uL (ref 3.8–10.8)

## 2015-11-07 LAB — POCT URINALYSIS DIPSTICK
BILIRUBIN UA: NEGATIVE
GLUCOSE UA: NEGATIVE
Ketones, UA: NEGATIVE
LEUKOCYTES UA: NEGATIVE
NITRITE UA: NEGATIVE
RBC UA: NEGATIVE
Urobilinogen, UA: NEGATIVE
pH, UA: 6

## 2015-11-07 LAB — LIPID PANEL
CHOL/HDL RATIO: 2.7 ratio (ref <=5.0)
CHOLESTEROL: 196 mg/dL (ref 125–200)
HDL: 73 mg/dL (ref >=46)
LDL Cholesterol: 96 mg/dL (ref <130)
Triglycerides: 135 mg/dL (ref <150)
VLDL: 27 mg/dL (ref <30)

## 2015-11-07 LAB — TSH: TSH: 3.44 mIU/L

## 2015-11-07 NOTE — Patient Instructions (Signed)
Jasmine [name], Jasmine [name] Main

## 2015-11-07 NOTE — Progress Notes (Signed)
60 y.o. [id number] MarriedCaucasianF here for annual exam.  Doing well.  No vaginal bleeding.    Having more issues with constipation.  Taking a stool softener.    PCP:  Dr. Joseph Art.  Retired.   Patient's last menstrual period was 01/28/2010.          Sexually active: Yes.    The current method of family planning is post menopausal status.    Exercising: No.  The patient does not participate in regular exercise at present. Smoker:  yes  Health Maintenance: Pap:  09/17/2013 negative, HR HPV negative History of abnormal Pap:  no MMG:  10/14/2015 BIRADS 2 benign  Colonoscopy: 03/14/2011 diverticulosis.  Follow-up 10 years.  Dr. Olevia Perches.   BMD:   Pt states years ago   TDaP:  2010  Pneumonia vaccine(s):  never Zostavax:   <1 year ago at [facility] testing: donated blood recently  Screening Labs: drawn today, Hb today: 11.7, Urine today: trace protein    reports that she has been smoking Cigarettes.  She has been smoking about 0.50 packs per day. She has never used smokeless tobacco. She reports that she drinks alcohol. She reports that she does not use illicit drugs.  Past Medical History  Diagnosis Date  . Anxiety   . Arthritis   . Depression   . Diverticulitis   . Diverticulosis   . Ovarian cyst   . GERD (gastroesophageal reflux disease)     tums only PRN  . Sleep apnea     no CPAP    Past Surgical History  Procedure Laterality Date  . Carpal tunnel release Bilateral 1997  . 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 Joint replacement Bilateral 04/2010  . Bladder suspension N/A 04/24/2013    Procedure: TRANSVAGINAL TAPE (TVT) Exact PROCEDURE;  Surgeon: Elveria Royals, MD;  Location: [facility];  Service: Gynecology;  Laterality: N/A;  . Cystoscopy N/A 04/24/2013    Procedure: CYSTOSCOPY;  Surgeon: Elveria Royals, MD;  Location: [facility];  Service: Gynecology;  Laterality: N/A;  . Laparoscopy Bilateral 05/03/2014    Procedure: LAPAROSCOPIC BILATERAL SALPINGO OOPHORECTOMY;   Surgeon: Lyman Speller, MD;  Location: [facility];  Service: Gynecology;  Laterality: Bilateral;    Family History  Problem Relation Age of Onset  . Multiple myeloma Mother   . Cancer Mother   . Heart attack Father     ROS:  Pertinent items are noted in HPI.  Otherwise, a comprehensive ROS was negative.  Exam:   Filed Vitals:   11/07/15 1347  BP: 126/84  Pulse: 74  Resp: 18  Height: '5\' 7"'  (1.702 m)  Weight: 230 lb 9.6 oz (104.599 kg)    General appearance: alert, cooperative and appears stated age Head: Normocephalic, without obvious abnormality, atraumatic Neck: no adenopathy, supple, symmetrical, trachea midline and thyroid normal to inspection and palpation Lungs: clear to auscultation bilaterally Breasts: normal appearance, no masses or tenderness Heart: regular rate and rhythm Abdomen: soft, non-tender; bowel sounds normal; no masses,  no organomegaly Extremities: extremities normal, atraumatic, no cyanosis or edema Skin: Skin color, texture, turgor normal. No rashes or lesions Lymph nodes: Cervical, supraclavicular, and axillary nodes normal. No abnormal inguinal nodes palpated Neurologic: Grossly normal   Pelvic: External genitalia:  no lesions              Urethra:  normal appearing urethra with no masses, tenderness or lesions              Bartholins and  Skenes: normal                 Vagina: normal appearing vagina with normal color and discharge, no lesions              Cervix: no lesions              Pap taken: Yes.   Bimanual Exam:  Uterus:  normal size, contour, position, consistency, mobility, non-tender              Adnexa: normal adnexa and no mass, fullness, tenderness               Rectovaginal: Confirms               Anus:  normal sphincter tone, no lesions  Chaperone was present for exam.  A:  Well woman with normal exam H/O ASCUS with neg HR HPV 4/13 PMP, no HRT  SUI h/o mid-urethral sling, 03/2013 H/O Depression  Smoker  S/P BSO  1/16  P: Mamogram yearly Pap with neg HR HPV 5/15.  Pap only today Encouraged smoking cessation, she is not ready to proceed with any measures at this time.  CMP, CBC, TSH, Vit D, Lipids BMD at age 35. Will remind pt when her next MMG is due. FU 1 year or prn

## 2015-11-08 LAB — IPS PAP TEST WITH REFLEX TO HPV

## 2015-11-08 LAB — VITAMIN D 25 HYDROXY (VIT D DEFICIENCY, FRACTURES): VIT D 25 HYDROXY: 32 ng/mL (ref 30–100)

## 2015-11-11 ENCOUNTER — Encounter: Payer: Self-pay | Admitting: Obstetrics & Gynecology

## 2015-11-30 ENCOUNTER — Encounter: Payer: Self-pay | Admitting: Obstetrics & Gynecology

## 2015-12-23 ENCOUNTER — Ambulatory Visit: Payer: 59 | Admitting: Obstetrics & Gynecology

## 2016-01-05 ENCOUNTER — Ambulatory Visit (INDEPENDENT_AMBULATORY_CARE_PROVIDER_SITE_OTHER): Payer: 59 | Admitting: Gastroenterology

## 2016-01-05 ENCOUNTER — Encounter: Payer: Self-pay | Admitting: Gastroenterology

## 2016-01-05 VITALS — BP 130/88 | HR 88 | Ht 66.5 in | Wt 232.1 lb

## 2016-01-05 DIAGNOSIS — R1012 Left upper quadrant pain: Secondary | ICD-10-CM | POA: Diagnosis not present

## 2016-01-05 DIAGNOSIS — K5732 Diverticulitis of large intestine without perforation or abscess without bleeding: Secondary | ICD-10-CM | POA: Diagnosis not present

## 2016-01-05 DIAGNOSIS — R0789 Other chest pain: Secondary | ICD-10-CM

## 2016-01-05 MED ORDER — DICYCLOMINE HCL 10 MG PO CAPS: 10.0000 mg | ORAL_CAPSULE | Freq: Three times a day (TID) | ORAL | 0 refills | Status: DC | PRN

## 2016-01-05 NOTE — Patient Instructions (Addendum)
If you are age 61 or older, your body mass index should be between 23-30. Your Body mass index is 36.9 kg/m. If this is out of the aforementioned range listed, please consider follow up with your Primary Care Provider.  If you are age 69 or younger, your body mass index should be between 19-25. Your Body mass index is 36.9 kg/m. If this is out of the aformentioned range listed, please consider follow up with your Primary Care Provider.   Prescribed dicyclomine to try for intermittent left sided pain.  If an episode lasts 2-3 days, call us and we will get a CT scan of the abdomen to see if diverticulitis.  Thank you for choosing [facility]  Dr Wilfrid Lund III

## 2016-01-05 NOTE — Progress Notes (Signed)
[facility] Consult Note:  History: Jasmine [name] 01/05/2016  Referring physician: No PCP Per Patient  Reason for consult/chief complaint: Diverticulitis (intermittent flares, still having some LLQ sensation) and odynophagia (pain in esophagus when eating)   Subjective  HPI:  This is a 60 year old woman referred for left upper quadrant pain and suspected diverticulitis. I reviewed Dr. Nichola Sizer last note from April 2014, which was soon after the patient's first episode of documented diverticulitis. Her last colonoscopy was in 2012. The report indicates severe diverticulosis with tortuosity. The cecum was reached, the prep was reportedly good and no polyps were seen. The initial recommendation was repeat colonoscopy in 10 years, but Dr. Nichola Sizer last office note indicated repeat colonoscopy at a 5 year interval. Jasmine [name] is here for intermittent left upper quadrant pain that she believes to be diverticulitis. She has had several episodes since her visit with Dr. Olevia Perches in 2014, they have always been evaluated by primary care or recently at an urgent care. They begin with crampy left upper quadrant pain that settles in for a few days and seems to steadily get worse. She has received antibiotics for all these episodes, we have no office notes of those encounters. He has not had another CT scan since 2014. She also describes a frequent postprandial chest discomfort that is quite vague and sounds mild. She says it feels like "a bruise". It is not sharp, pressure, radiating, and there is no dysphagia odynophagia, nausea vomiting, early satiety or weight loss. She tends toward constipation due to the diverticulosis, and has taken various treatments for that. She denies rectal bleeding.  ROS:  Review of Systems  Constitutional: Negative for appetite change and unexpected weight change.  HENT: Negative for mouth sores and voice change.   Eyes: Negative for pain and redness.  Respiratory:  Negative for cough and shortness of breath.   Cardiovascular: Negative for chest pain and palpitations.  Genitourinary: Negative for dysuria and hematuria.  Musculoskeletal: Negative for arthralgias and myalgias.  Skin: Negative for pallor and rash.  Neurological: Negative for weakness and headaches.  Hematological: Negative for adenopathy.   She reports that her depression is well controlled.  Past Medical History: Past Medical History:  Diagnosis Date  . Anxiety   . Arthritis   . Depression   . Diverticulitis   . Diverticulosis   . GERD (gastroesophageal reflux disease)    tums only PRN  . Ovarian cyst   . Sleep apnea    no CPAP     Past Surgical History: Past Surgical History:  Procedure Laterality Date  . BLADDER SUSPENSION N/A 04/24/2013   Procedure: TRANSVAGINAL TAPE (TVT) Exact PROCEDURE;  Surgeon: Elveria Royals, MD;  Location: [facility];  Service: Gynecology;  Laterality: N/A;  . CARPAL TUNNEL RELEASE Bilateral 1997  . CYSTOSCOPY N/A 04/24/2013   Procedure: CYSTOSCOPY;  Surgeon: Elveria Royals, MD;  Location: [facility];  Service: Gynecology;  Laterality: N/A;  . JOINT REPLACEMENT Bilateral 04/2010  . LAPAROSCOPY Bilateral 05/03/2014   Procedure: LAPAROSCOPIC BILATERAL SALPINGO OOPHORECTOMY;  Surgeon: Lyman Speller, MD;  Location: [facility];  Service: Gynecology;  Laterality: Bilateral;  . TOTAL KNEE ARTHROPLASTY Bilateral 04/2010     Family History: Family History  Problem Relation Age of Onset  . Multiple myeloma Mother   . Cancer Mother   . Heart attack Father     Social History: Social History   Social History  . Marital status: Married    Spouse name: N/A  . Number of  children: N/A  . Years of education: N/A   Social History Main Topics  . Smoking status: Current Every Day Smoker    Packs/day: 0.50    Types: Cigarettes  . Smokeless tobacco: Never Used     Comment: 1/2 PPD  . Alcohol use 0.0 oz/week     Comment: 1 x per month  . Drug use: No   . Sexual activity: No     Comment: vasectomy   Other Topics Concern  . None   Social History Narrative  . None    Allergies: No Known Allergies  Outpatient Meds: Current Outpatient Prescriptions  Medication Sig Dispense Refill  . calcium carbonate (TUMS - DOSED IN MG ELEMENTAL CALCIUM) 500 MG chewable tablet Chew 2 tablets by mouth as needed for indigestion or heartburn.    . Calcium-Magnesium-Vitamin D (CALCIUM 1200+D3 PO) Take 1 tablet by mouth 2 (two) times daily.    Marland Kitchen docusate sodium (COLACE) 100 MG capsule Take 200 mg by mouth at bedtime.    Marland Kitchen FIBER FORMULA CAPS Take 1 capsule by mouth 2 (two) times daily.     . fish oil-omega-3 fatty acids 1000 MG capsule Take 1 g by mouth daily.     Marland Kitchen imipramine (TOFRANIL) 50 MG tablet Take 50 mg by mouth Daily.    Marland Kitchen PARoxetine (PAXIL) 40 MG tablet Take 40 mg by mouth Daily.    Marland Kitchen amoxicillin (AMOXIL) 500 MG capsule Take 1 capsule (500 mg total) by mouth 3 (three) times daily. (Patient not taking: Reported on 11/07/2015) 30 capsule 0  . dicyclomine (BENTYL) 10 MG capsule Take 1 capsule (10 mg total) by mouth every 8 (eight) hours as needed for spasms. 20 capsule 0   No current facility-administered medications for this visit.       ___________________________________________________________________ Objective   Exam:  BP 130/88 (BP Location: Left Arm, Patient Position: Sitting, Cuff Size: Large)   Pulse 88   Ht 5' 6.5" (1.689 m) Comment: height measured without shoes  Wt 232 lb 2 oz (105.3 kg)   LMP 01/28/2010   BMI 36.90 kg/m    General: this is a(n) Overweight well-appearing middle-aged woman   Eyes: sclera anicteric, no redness  ENT: oral mucosa moist without lesions, no cervical or supraclavicular lymphadenopathy, good dentition  CV: RRR without murmur, S1/S2, no JVD, no peripheral edema  Resp: clear to auscultation bilaterally, normal RR and effort noted  GI: soft, no tenderness, with active bowel sounds. No guarding  or palpable organomegaly noted.  Skin; warm and dry, no rash or jaundice noted  Neuro: awake, alert and oriented x 3. Normal gross motor function and fluent speech No recent labs or imaging for review  Assessment: Encounter Diagnoses  Name Primary?  . LUQ pain Yes  . Diverticulitis of colon   . Other chest pain     [place] has had one episode of clearly documented diverticulitis on CT scan a few years ago. The other episodes may have truly been diverticulitis, but I wonder if there may be an element of colonic spasm. I see no indication for colonoscopy at this point. Her postprandial chest pain is very difficult to characterize, it does not sound pathologic and I'm not planning an endoscopic workup at this point. She knows to alert me if that seems to escalate or change character.  Plan:  Trial of as needed dicyclomine next episode of left-sided abdominal pain. If it does not help within a day, she will call me and we will probably  schedule CT scan abdomen and pelvis to see if she has documented diverticulitis before prescribing antibiotics. Repeated documented episodes of diverticulitis might eventually prompt surgical evaluation.  Thank you for the courtesy of this consult.  Please call me with any questions or concerns.  Nelida Meuse III  CC: No PCP Per Patient

## 2016-04-29 ENCOUNTER — Encounter: Payer: Self-pay | Admitting: Obstetrics & Gynecology

## 2016-05-08 DIAGNOSIS — L720 Epidermal cyst: Secondary | ICD-10-CM | POA: Diagnosis not present

## 2016-05-28 ENCOUNTER — Ambulatory Visit (HOSPITAL_COMMUNITY)
Admission: EM | Admit: 2016-05-28 | Discharge: 2016-05-28 | Disposition: A | Discharge status: Home / Self Care | Payer: 59 | Attending: Emergency Medicine | Admitting: Emergency Medicine

## 2016-05-28 ENCOUNTER — Encounter (HOSPITAL_COMMUNITY): Payer: Self-pay | Admitting: Emergency Medicine

## 2016-05-28 DIAGNOSIS — J029 Acute pharyngitis, unspecified: Secondary | ICD-10-CM

## 2016-05-28 DIAGNOSIS — B9789 Other viral agents as the cause of diseases classified elsewhere: Secondary | ICD-10-CM | POA: Diagnosis not present

## 2016-05-28 DIAGNOSIS — J069 Acute upper respiratory infection, unspecified: Secondary | ICD-10-CM | POA: Diagnosis not present

## 2016-05-28 MED ORDER — BENZONATATE 100 MG PO CAPS: 100.0000 mg | ORAL_CAPSULE | Freq: Three times a day (TID) | ORAL | 0 refills | Status: DC

## 2016-05-28 NOTE — ED Provider Notes (Signed)
CSN: [id number]     Arrival date & time 05/28/16  1000 History   None    Chief Complaint  Patient presents with  . Sore Throat   (Consider location/radiation/quality/duration/timing/severity/associated sxs/prior Treatment) 61 year old female presents to clinic with 4 day history of cough and sore throat. Denies any nausea, vomiting, or diarrhea. Denies muscle aches or body aches. Cough is dry and hacking, non-productive, sputum is clear. Denies fever.    Sore Throat     Past Medical History:  Diagnosis Date  . Anxiety   . Arthritis   . Depression   . Diverticulitis   . Diverticulosis   . GERD (gastroesophageal reflux disease)    tums only PRN  . Ovarian cyst   . Sleep apnea    no CPAP   Past Surgical History:  Procedure Laterality Date  . BLADDER SUSPENSION N/A 04/24/2013   Procedure: TRANSVAGINAL TAPE (TVT) Exact PROCEDURE;  Surgeon: Elveria Royals, MD;  Location: [facility];  Service: Gynecology;  Laterality: N/A;  . CARPAL TUNNEL RELEASE Bilateral 1997  . CYSTOSCOPY N/A 04/24/2013   Procedure: CYSTOSCOPY;  Surgeon: Elveria Royals, MD;  Location: [facility];  Service: Gynecology;  Laterality: N/A;  . JOINT REPLACEMENT Bilateral 04/2010  . LAPAROSCOPY Bilateral 05/03/2014   Procedure: LAPAROSCOPIC BILATERAL SALPINGO OOPHORECTOMY;  Surgeon: Lyman Speller, MD;  Location: [facility];  Service: Gynecology;  Laterality: Bilateral;  . TOTAL KNEE ARTHROPLASTY Bilateral 04/2010   Family History  Problem Relation Age of Onset  . Multiple myeloma Mother   . Cancer Mother   . Heart attack Father    Social History  Substance Use Topics  . Smoking status: Current Every Day Smoker    Packs/day: 0.50    Types: Cigarettes  . Smokeless tobacco: Never Used     Comment: 1/2 PPD  . Alcohol use 0.0 oz/week     Comment: 1 x per month   OB History    Gravida Para Term Preterm AB Living   _0 SAB TAB Ectopic Multiple Live Births                 Review of Systems  Reason  unable to perform ROS: as covered in HPI.  All other systems reviewed and are negative.   Allergies  Patient has no known allergies.  Home Medications   Prior to Admission medications   Medication Sig Start Date End Date Taking? Authorizing Provider  amoxicillin (AMOXIL) 500 MG capsule Take 1 capsule (500 mg total) by mouth 3 (three) times daily. Patient not taking: Reported on 11/07/2015 09/27/15   Billy Fischer, MD  benzonatate (TESSALON) 100 MG capsule Take 1 capsule (100 mg total) by mouth every 8 (eight) hours. 05/28/16   Barnet Glasgow, NP  calcium carbonate (TUMS - DOSED IN MG ELEMENTAL CALCIUM) 500 MG chewable tablet Chew 2 tablets by mouth as needed for indigestion or heartburn.    Historical Provider, MD  Calcium-Magnesium-Vitamin D (CALCIUM 1200+D3 PO) Take 1 tablet by mouth 2 (two) times daily.    Historical Provider, MD  dicyclomine (BENTYL) 10 MG capsule Take 1 capsule (10 mg total) by mouth every 8 (eight) hours as needed for spasms. 01/05/16   Nelida Meuse III, MD  docusate sodium (COLACE) 100 MG capsule Take 200 mg by mouth at bedtime.    Historical Provider, MD  FIBER FORMULA CAPS Take 1 capsule by mouth 2 (two) times daily.  Historical Provider, MD  fish oil-omega-3 fatty acids 1000 MG capsule Take 1 g by mouth daily.     Historical Provider, MD  imipramine (TOFRANIL) 50 MG tablet Take 50 mg by mouth Daily. 02/20/11   Historical Provider, MD  PARoxetine (PAXIL) 40 MG tablet Take 40 mg by mouth Daily. 01/30/11   Historical Provider, MD   Meds Ordered and Administered this Visit  Medications - No data to display  BP 130/81 (BP Location: Right Arm)   Pulse 101   Temp 98.2 F (36.8 C) (Oral)   Resp 20   LMP 01/28/2010   SpO2 97%  No data found.   Physical Exam  Constitutional: She is oriented to person, place, and time. She appears well-developed and well-nourished. She does not have a sickly appearance. She does not appear ill. No distress.  HENT:  Head:  Normocephalic and atraumatic.  Right Ear: External ear normal. A middle ear effusion is present.  Left Ear: External ear normal. A middle ear effusion is present.  Nose: Nose normal. Right sinus exhibits no maxillary sinus tenderness and no frontal sinus tenderness. Left sinus exhibits no maxillary sinus tenderness and no frontal sinus tenderness.  Mouth/Throat: Uvula is midline and oropharynx is clear and moist. No oropharyngeal exudate.  TM normal without signs of infection, clear fluid seen in inner ear with slight bulging   Eyes: Pupils are equal, round, and reactive to light.  Neck: Normal range of motion. Neck supple. No JVD present.  Cardiovascular: Normal rate and regular rhythm.   Pulmonary/Chest: Effort normal and breath sounds normal. No respiratory distress. She has no wheezes.  Abdominal: Soft. Bowel sounds are normal. She exhibits no distension. There is no tenderness. There is no guarding.  Lymphadenopathy:    She has no cervical adenopathy.  Neurological: She is alert and oriented to person, place, and time.  Skin: Skin is warm and dry. Capillary refill takes less than 2 seconds. She is not diaphoretic.  Psychiatric: She has a normal mood and affect.  Nursing note and vitals reviewed.   Urgent Care Course     Procedures (including critical care time)  Labs Review Labs Reviewed - No data to display  Imaging Review No results found.   Visual Acuity Review  Right Eye Distance:   Left Eye Distance:   Bilateral Distance:    Right Eye Near:   Left Eye Near:    Bilateral Near:         MDM   1. Viral pharyngitis   2. Viral URI with cough   You most likely have a viral URI, I advise rest, plenty of fluids and management of symptoms with over the counter medicines. For symptoms you may take Tylenol as needed every 4-6 hours for body aches or fever, not to exceed 4,000 mg a day, Take mucinex or mucinex DM ever 12 hours with a full glass of water, you may use an  inhaled steroid such as Flonase, 2 sprays each nostril once a day for congestion, or an antihistamine such as Claritin or Zyrtec once a day. For sore throat, you may use chloraseptic lozenges or throat spray as needed.  In addition to these therapies, I have sent a prescription to your pharmacy for Tessalon, take 1 tablet every 8 hours as needed for cough.  Should your symptoms worsen or fail to resolve, follow up with your primary care provider or return to clinic.       Barnet Glasgow, NP 05/28/16 1059

## 2016-05-28 NOTE — ED Triage Notes (Signed)
The patient presented to the [facility] with a complaint of a sore throat, cough, and headache x 1 week. The patient denied any fever at home.

## 2016-05-28 NOTE — Discharge Instructions (Signed)
You most likely have a viral URI, I advise rest, plenty of fluids and management of symptoms with over the counter medicines. For symptoms you may take Tylenol as needed every 4-6 hours for body aches or fever, not to exceed 4,000 mg a day, Take mucinex or mucinex DM ever 12 hours with a full glass of water, you may use an inhaled steroid such as Flonase, 2 sprays each nostril once a day for congestion, or an antihistamine such as Claritin or Zyrtec once a day. For sore throat, you may use chloraseptic lozenges or throat spray as needed.  In addition to these therapies, I have sent a prescription to your pharmacy for Tessalon, take 1 tablet every 8 hours as needed for cough.  Should your symptoms worsen or fail to resolve, follow up with your primary care provider or return to clinic.

## 2016-06-27 DIAGNOSIS — Z85828 Personal history of other malignant neoplasm of skin: Secondary | ICD-10-CM | POA: Diagnosis not present

## 2016-06-27 DIAGNOSIS — D2272 Melanocytic nevi of left lower limb, including hip: Secondary | ICD-10-CM | POA: Diagnosis not present

## 2016-10-16 DIAGNOSIS — Z780 Asymptomatic menopausal state: Secondary | ICD-10-CM | POA: Diagnosis not present

## 2016-10-16 DIAGNOSIS — Z1231 Encounter for screening mammogram for malignant neoplasm of breast: Secondary | ICD-10-CM | POA: Diagnosis not present

## 2016-11-01 ENCOUNTER — Encounter: Payer: Self-pay | Admitting: Obstetrics & Gynecology

## 2017-02-01 ENCOUNTER — Encounter: Payer: Self-pay | Admitting: Obstetrics & Gynecology

## 2017-02-01 ENCOUNTER — Ambulatory Visit (INDEPENDENT_AMBULATORY_CARE_PROVIDER_SITE_OTHER): Payer: 59 | Admitting: Obstetrics & Gynecology

## 2017-02-01 VITALS — BP 128/80 | HR 84 | Resp 16 | Ht 66.5 in | Wt 236.0 lb

## 2017-02-01 DIAGNOSIS — Z0000 Encounter for general adult medical examination without abnormal findings: Secondary | ICD-10-CM | POA: Diagnosis not present

## 2017-02-01 DIAGNOSIS — Z01419 Encounter for gynecological examination (general) (routine) without abnormal findings: Secondary | ICD-10-CM

## 2017-02-01 DIAGNOSIS — R740 Nonspecific elevation of levels of transaminase and lactic acid dehydrogenase [LDH]: Secondary | ICD-10-CM | POA: Diagnosis not present

## 2017-02-01 DIAGNOSIS — R7401 Elevation of levels of liver transaminase levels: Secondary | ICD-10-CM

## 2017-02-01 NOTE — Progress Notes (Signed)
61 y.o. [id number] MarriedCaucasianF here for annual exam.  Doing well.  Denies vaginal bleeding.  No urinary issues.  Very happy with decision to have sling placed 05/03/14.   Patient's last menstrual period was 01/28/2010.          Sexually active: No.  The current method of family planning is post menopausal status.    Exercising: No. Smoker:  yes  Pap:  11/07/15 Neg  09/17/13 Neg. HR HPV:neg  History of abnormal Pap:  Yes, remote hx MMG:  10/16/16 BIRADS2:benign  Colonoscopy:  03/14/11 f/u 10 years  BMD:   10/16/16 Normal  TDaP:  2010 Pneumonia vaccine(s):  N/A Zostavax:   2016 Hep C testing: Donates blood Screening Labs: fasting labs   reports that she quit smoking about 3 months ago. Her smoking use included Cigarettes. She smoked 0.50 packs per day. She has never used smokeless tobacco. She reports that she drinks alcohol. She reports that she does not use drugs.  Past Medical History:  Diagnosis Date  . Anxiety   . Arthritis   . Depression   . Diverticulitis   . Diverticulosis   . GERD (gastroesophageal reflux disease)    tums only PRN  . Ovarian cyst   . Sleep apnea    no CPAP    Past Surgical History:  Procedure Laterality Date  . BLADDER SUSPENSION N/A 04/24/2013   Procedure: TRANSVAGINAL TAPE (TVT) Exact PROCEDURE;  Surgeon: Elveria Royals, MD;  Location: [facility];  Service: Gynecology;  Laterality: N/A;  . CARPAL TUNNEL RELEASE Bilateral 1997  . CYSTOSCOPY N/A 04/24/2013   Procedure: CYSTOSCOPY;  Surgeon: Elveria Royals, MD;  Location: [facility];  Service: Gynecology;  Laterality: N/A;  . JOINT REPLACEMENT Bilateral 04/2010  . LAPAROSCOPY Bilateral 05/03/2014   Procedure: LAPAROSCOPIC BILATERAL SALPINGO OOPHORECTOMY;  Surgeon: Lyman Speller, MD;  Location: [facility];  Service: Gynecology;  Laterality: Bilateral;  . TOTAL KNEE ARTHROPLASTY Bilateral 04/2010    Current Outpatient Prescriptions  Medication Sig Dispense Refill  . ALPRAZolam (XANAX) 0.5 MG tablet Take 1  tablet by mouth daily.    . calcium carbonate (TUMS - DOSED IN MG ELEMENTAL CALCIUM) 500 MG chewable tablet Chew 2 tablets by mouth as needed for indigestion or heartburn.    . Calcium-Magnesium-Vitamin D (CALCIUM 1200+D3 PO) Take 1 tablet by mouth 2 (two) times daily.    Marland Kitchen docusate sodium (COLACE) 100 MG capsule Take 200 mg by mouth at bedtime.    Marland Kitchen FIBER FORMULA CAPS Take 1 capsule by mouth 2 (two) times daily.     . fish oil-omega-3 fatty acids 1000 MG capsule Take 1 g by mouth daily.     Marland Kitchen imipramine (TOFRANIL) 50 MG tablet Take 50 mg by mouth Daily.    Marland Kitchen PARoxetine (PAXIL) 40 MG tablet Take 40 mg by mouth Daily.    Marland Kitchen amoxicillin (AMOXIL) 500 MG capsule Take 1 capsule (500 mg total) by mouth 3 (three) times daily. (Patient not taking: Reported on 11/07/2015) 30 capsule 0   No current facility-administered medications for this visit.     Family History  Problem Relation Age of Onset  . Multiple myeloma Mother   . Cancer Mother   . Heart attack Father     ROS:  Pertinent items are noted in HPI.  Otherwise, a comprehensive ROS was negative.  Exam:   BP 128/80 (BP Location: Right Arm, Patient Position: Sitting, Cuff Size: Large)   Pulse 84   Resp 16   Ht 5'  6.5" (1.689 m)   Wt 236 lb (107 kg)   LMP 01/28/2010   BMI 37.52 kg/m   Height: 5' 6.5" (168.9 cm)  Ht Readings from Last 3 Encounters:  02/01/17 5' 6.5" (1.689 m)  01/05/16 5' 6.5" (1.689 m)  11/07/15 '5\' 7"'  (1.702 m)    General appearance: alert, cooperative and appears stated age Head: Normocephalic, without obvious abnormality, atraumatic Neck: no adenopathy, supple, symmetrical, trachea midline and thyroid normal to inspection and palpation Lungs: clear to auscultation bilaterally Breasts: normal appearance, no masses or tenderness Heart: regular rate and rhythm Abdomen: soft, non-tender; bowel sounds normal; no masses,  no organomegaly Extremities: extremities normal, atraumatic, no cyanosis or edema Skin: Skin  color, texture, turgor normal. No rashes or lesions Lymph nodes: Cervical, supraclavicular, and axillary nodes normal. No abnormal inguinal nodes palpated Neurologic: Grossly normal   Pelvic: External genitalia:  no lesions              Urethra:  normal appearing urethra with no masses, tenderness or lesions              Bartholins and Skenes: normal                 Vagina: normal appearing vagina with normal color and discharge, no lesions              Cervix: no lesions              Pap taken: No. Bimanual Exam:  Uterus:  normal size, contour, position, consistency, mobility, non-tender              Adnexa: normal adnexa and no mass, fullness, tenderness               Rectovaginal: Confirms               Anus:  normal sphincter tone, no lesions  Chaperone was present for exam.  A:  Well Woman with normal exam PMP, no HRT H/O SUI mid-urethral sling, 12/14 H/O depression Smoker S/P BSO 1/16  P:   Mammogram guidelines reviewed.  Doing yearly pap smear not obtained today CMP, HbA1C, Lipid Return annually or prn

## 2017-02-02 LAB — COMPREHENSIVE METABOLIC PANEL
ALBUMIN: 4.2 g/dL (ref 3.6–4.8)
ALT: 38 IU/L — ABNORMAL HIGH (ref 0–32)
AST: 34 IU/L (ref 0–40)
Albumin/Globulin Ratio: 1.4 (ref 1.2–2.2)
Alkaline Phosphatase: 142 IU/L — ABNORMAL HIGH (ref 39–117)
BUN / CREAT RATIO: 16 (ref 12–28)
BUN: 16 mg/dL (ref 8–27)
Bilirubin Total: 0.3 mg/dL (ref 0.0–1.2)
CALCIUM: 9.6 mg/dL (ref 8.7–10.3)
CO2: 23 mmol/L (ref 20–29)
CREATININE: 1 mg/dL (ref 0.57–1.00)
Chloride: 101 mmol/L (ref 96–106)
GFR calc Af Amer: 70 mL/min/{1.73_m2} (ref >59)
GFR, EST NON AFRICAN AMERICAN: 61 mL/min/{1.73_m2} (ref >59)
GLOBULIN, TOTAL: 2.9 g/dL (ref 1.5–4.5)
GLUCOSE: 82 mg/dL (ref 65–99)
Potassium: 4.7 mmol/L (ref 3.5–5.2)
SODIUM: 139 mmol/L (ref 134–144)
Total Protein: 7.1 g/dL (ref 6.0–8.5)

## 2017-02-02 LAB — LIPID PANEL
CHOL/HDL RATIO: 2.8 ratio (ref 0.0–4.4)
Cholesterol, Total: 216 mg/dL — ABNORMAL HIGH (ref 100–199)
HDL: 78 mg/dL (ref >39)
LDL CALC: 119 mg/dL — AB (ref 0–99)
TRIGLYCERIDES: 94 mg/dL (ref 0–149)
VLDL CHOLESTEROL CAL: 19 mg/dL (ref 5–40)

## 2017-02-02 LAB — HEMOGLOBIN A1C
Est. average glucose Bld gHb Est-mCnc: 126 mg/dL
Hgb A1c MFr Bld: 6 % — ABNORMAL HIGH (ref 4.8–5.6)

## 2017-02-04 DIAGNOSIS — H25013 Cortical age-related cataract, bilateral: Secondary | ICD-10-CM | POA: Diagnosis not present

## 2017-02-04 DIAGNOSIS — H35361 Drusen (degenerative) of macula, right eye: Secondary | ICD-10-CM | POA: Diagnosis not present

## 2017-02-04 DIAGNOSIS — H2513 Age-related nuclear cataract, bilateral: Secondary | ICD-10-CM | POA: Diagnosis not present

## 2017-02-07 NOTE — Addendum Note (Signed)
Addended by: Megan Salon on: 02/07/2017 11:29 PM   Modules accepted: Orders

## 2017-02-08 ENCOUNTER — Telehealth: Payer: Self-pay | Admitting: *Deleted

## 2017-02-08 NOTE — Telephone Encounter (Signed)
Patient returned call to Emily. °

## 2017-02-08 NOTE — Telephone Encounter (Signed)
Returned call to patient. Results reviewed with patient and she verbalized understanding. One month lab recheck scheduled for Monday 03/11/17 at 0830. Patient agreeable to date and time of appointment. Patient aware to be fasting for this appointment. Future order present for lab work.   Patient states she would like suggestions of PCP from Dr. Sabra Heck. RN advised will review with Dr. Sabra Heck and return call. Patient agreeable.   Routing to provider for review.

## 2017-02-08 NOTE — Telephone Encounter (Signed)
-----   Message from Megan Salon, MD sent at 02/07/2017 11:27 PM EDT ----- Please let pt know her her HbA1C is in mildly elevated at 6.0.  This is in the pre-diabetes range.  Cholesterol was mildly elevated at 216 and LDLs were mildly elevated at 119.  This can be watched.  Treatment not needed for this at this time.  CMP normal except alkaline phosphatase and ALT mildly elevated.  Pt should return for repeat liver panel in 1 month to make sure this is stable.  Order placed.  I think it's time for her to establish care with PCP particularly due to the pre-diabetes.  Does she needs a suggestion.

## 2017-02-08 NOTE — Telephone Encounter (Signed)
Message left to return call to Aftan Vint at [phone].    

## 2017-02-14 NOTE — Telephone Encounter (Signed)
Please schedule with the NP and let Mrs. Heckman know that Dr. Quay Burow isn't taking new patients right now so I felt this was a good option.

## 2017-02-14 NOTE — Telephone Encounter (Signed)
Call to [employer] at [place]. Patient is scheduled for 04/04/2017 at 10 am with Caesar Chestnut, FNP.  [facility] [place]. [place], [place] [id number]  Spoke with patient. Advised of appointment date, time, and location. Patient is agreeable.  Encounter closed.

## 2017-02-14 NOTE — Telephone Encounter (Signed)
Spoke with [facility] at [place]. Dr.Burns is not taking new patient's at this time. Only physicians at that pracitce that are are [facility] and Dr.John, both are scheduled out to December.

## 2017-02-14 NOTE — Telephone Encounter (Signed)
Communicated with pt personally as I know her.  Could you see if Dr. Billey Gosling is taking any new patients?  Thanks.  Routing to triage.

## 2017-02-18 ENCOUNTER — Telehealth: Payer: Self-pay | Admitting: Obstetrics & Gynecology

## 2017-02-18 NOTE — Telephone Encounter (Signed)
Left detailed message, ok per current dpr. Advised RX for Shigrix vaccine faxed to [facility]. May proceed with vaccination now and repeat in 2 to 6 months. Please return call to office at [phone] for any additional questions.    Patient is agreeable to disposition. Will close encounter.

## 2017-02-18 NOTE — Telephone Encounter (Signed)
Patient would like Dr Sabra Heck to send a prescription for the shingles shot to [facility]. Pharmacy number is [phone].

## 2017-02-18 NOTE — Telephone Encounter (Signed)
Ok to proceed with the shingrix vaccination now and repeat in two to six months.  I wrote rx and it is fine to fax it.  Thanks.

## 2017-02-18 NOTE — Telephone Encounter (Signed)
Dr. Sabra Heck -per review of EPIC, Zostavax received 02/10/15. Please advise on RX for Shingrix vaccine ?

## 2017-02-27 ENCOUNTER — Encounter (INDEPENDENT_AMBULATORY_CARE_PROVIDER_SITE_OTHER): Payer: Self-pay

## 2017-02-27 ENCOUNTER — Encounter: Payer: Self-pay | Admitting: Physician Assistant

## 2017-02-27 ENCOUNTER — Ambulatory Visit (INDEPENDENT_AMBULATORY_CARE_PROVIDER_SITE_OTHER): Payer: 59 | Admitting: Physician Assistant

## 2017-02-27 VITALS — BP 130/72 | HR 72 | Ht 66.5 in | Wt 238.6 lb

## 2017-02-27 DIAGNOSIS — R1013 Epigastric pain: Secondary | ICD-10-CM

## 2017-02-27 MED ORDER — OMEPRAZOLE 20 MG PO CPDR: 20.0000 mg | DELAYED_RELEASE_CAPSULE | Freq: Every day | ORAL | 1 refills | Status: DC

## 2017-02-27 NOTE — Patient Instructions (Signed)
We have sent the following medications to your pharmacy for you to pick up at your convenience:  Omeprazole 20 mg daily 30-60 minutes before breakfast   We have given you a handout on GERD with dietary guidelines, please strive to adhere to these.

## 2017-02-27 NOTE — Progress Notes (Signed)
Thank you for sending this case to me. I have reviewed the entire note, and the outlined plan seems appropriate.  Agreed that it sounds most likely benign. If no improvement at follow up with meds prescribed, schedule EGD with me.  Wilfrid Lund, MD

## 2017-02-27 NOTE — Progress Notes (Signed)
Chief Complaint: Epigastric abdominal pain  HPI:  Jasmine [name] is a 61 year old Caucasian female with a past medical history as listed below, who follows with Dr. Loletha Carrow and presents to clinic today for a complaint of epigastric abdominal pain   Today, the patient describes that for the past 2 months when she eats meals throughout the day she swallows them and feels as though they hit her stomach and then she gets a warm feeling that spreads out from the center to both sides of her upper abdomen. Sometimes this is slightly closer to her bellybutton. This occurs 80% of the time after eating. She describes that it will last about 30 seconds and then goes away on its own. Patient does tell me she has occasional reflux symptoms for which she uses Tums occasionally, but has not had to use these more often than normal. She is not on any antireflux medications and denies use of any NSAIDs recently.   Patient denies fever, chills, weight loss, melena, change in bowel habits, blood in her stool, nausea, vomiting or dysphagia.    Past Medical History:  Diagnosis Date  . Anxiety   . Arthritis   . Depression   . Diverticulitis   . Diverticulosis   . GERD (gastroesophageal reflux disease)    tums only PRN  . Ovarian cyst   . Prediabetes   . Sleep apnea    no CPAP    Past Surgical History:  Procedure Laterality Date  . BLADDER SUSPENSION N/A 04/24/2013   Procedure: TRANSVAGINAL TAPE (TVT) Exact PROCEDURE;  Surgeon: Elveria Royals, MD;  Location: [facility];  Service: Gynecology;  Laterality: N/A;  . CARPAL TUNNEL RELEASE Bilateral 1997  . CYSTOSCOPY N/A 04/24/2013   Procedure: CYSTOSCOPY;  Surgeon: Elveria Royals, MD;  Location: [facility];  Service: Gynecology;  Laterality: N/A;  . JOINT REPLACEMENT Bilateral 04/2010  . LAPAROSCOPY Bilateral 05/03/2014   Procedure: LAPAROSCOPIC BILATERAL SALPINGO OOPHORECTOMY;  Surgeon: Lyman Speller, MD;  Location: [facility];  Service: Gynecology;  Laterality:  Bilateral;  . TOTAL KNEE ARTHROPLASTY Bilateral 04/2010    Current Outpatient Prescriptions  Medication Sig Dispense Refill  . ALPRAZolam (XANAX) 0.5 MG tablet Take 1 tablet by mouth daily.    Marland Kitchen amoxicillin (AMOXIL) 500 MG capsule Take 1 capsule (500 mg total) by mouth 3 (three) times daily. 30 capsule 0  . calcium carbonate (TUMS - DOSED IN MG ELEMENTAL CALCIUM) 500 MG chewable tablet Chew 2 tablets by mouth as needed for indigestion or heartburn.    . Calcium-Magnesium-Vitamin D (CALCIUM 1200+D3 PO) Take 1 tablet by mouth 2 (two) times daily.    Marland Kitchen docusate sodium (COLACE) 100 MG capsule Take 200 mg by mouth at bedtime.    Marland Kitchen FIBER FORMULA CAPS Take 1 capsule by mouth 2 (two) times daily.     . fish oil-omega-3 fatty acids 1000 MG capsule Take 1 g by mouth daily.     Marland Kitchen imipramine (TOFRANIL) 50 MG tablet Take 50 mg by mouth Daily.    Marland Kitchen PARoxetine (PAXIL) 40 MG tablet Take 40 mg by mouth Daily.    Marland Kitchen omeprazole (PRILOSEC) 20 MG capsule Take 1 capsule (20 mg total) by mouth daily. 30 capsule 1   No current facility-administered medications for this visit.     Allergies as of 02/27/2017  . (No Known Allergies)    Family History  Problem Relation Age of Onset  . Multiple myeloma Mother   . Cancer Mother   . Heart attack  Father     Social History   Social History  . Marital status: Married    Spouse name: N/A  . Number of children: N/A  . Years of education: N/A   Occupational History  . Not on file.   Social History Main Topics  . Smoking status: Former Smoker    Packs/day: 0.50    Types: Cigarettes    Quit date: 10/28/2016  . Smokeless tobacco: Never Used     Comment: 1/2 PPD  . Alcohol use 0.0 oz/week     Comment: 1 x per month  . Drug use: No  . Sexual activity: No     Comment: vasectomy   Other Topics Concern  . Not on file   Social History Narrative  . No narrative on file    Review of Systems:    Constitutional: No weight loss, fever or chills Skin: No  rash  Cardiovascular: No chest pain Respiratory: No SOB  Gastrointestinal: See HPI and otherwise negative Genitourinary: No dysuria  Neurological: No headache Musculoskeletal: No new muscle or joint pain Hematologic: No bleeding or bruising Psychiatric: No history of depression or anxiety   Physical Exam:  Vital signs: BP 130/72   Pulse 72   Ht 5' 6.5" (1.689 m)   Wt 238 lb 9.6 oz (108.2 kg)   LMP 01/28/2010   BMI 37.93 kg/m   Constitutional:   Pleasant overweight Caucasian female appears to be in NAD, Well developed, Well nourished, alert and cooperative Head:  Normocephalic and atraumatic. Eyes:   PEERL, EOMI. No icterus. Conjunctiva pink. Ears:  Normal auditory acuity. Neck:  Supple Throat: Oral cavity and pharynx without inflammation, swelling or lesion.  Respiratory: Respirations even and unlabored. Lungs clear to auscultation bilaterally.   No wheezes, crackles, or rhonchi.  Cardiovascular: Normal S1, S2. No MRG. Regular rate and rhythm. No peripheral edema, cyanosis or pallor.  Gastrointestinal:  Soft, nondistended, nontender. No rebound or guarding. Normal bowel sounds. No appreciable masses or hepatomegaly. Rectal:  Not performed.  Msk:  Symmetrical without gross deformities. Without edema, no deformity or joint abnormality.  Neurologic:  Alert and  oriented x4;  grossly normal neurologically.  Skin:   Dry and intact without significant lesions or rashes. Psychiatric:  Demonstrates good judgement and reason without abnormal affect or behaviors.  No recent labs or imaging.  Assessment: 1. Epigastric abdominal pain: patient describes a warm sensation which spreads across her upper abdomen last for 30 seconds after eating most "larger meals" of the day, occasional reflux symptoms helped by Tums; consider heartburn/gastritis  Plan: 1. Started patient on Omeprazole 20 mg once daily, 30-60 minutes before eating breakfast in the morning for 30 days. 2. Reviewed antireflux  diet and lifestyle modifications. 3. Discussed with the patient that if she does not have relief of symptoms or at least decrease in symptoms with above therapy can consider an EGD for further evaluation. 4. Patient to follow with me in 3-4 weeks  Ellouise Newer, PA-C [facility] 02/27/2017, 11:42 AM

## 2017-03-11 ENCOUNTER — Other Ambulatory Visit (INDEPENDENT_AMBULATORY_CARE_PROVIDER_SITE_OTHER): Payer: 59

## 2017-03-11 DIAGNOSIS — R7401 Elevation of levels of liver transaminase levels: Secondary | ICD-10-CM

## 2017-03-11 DIAGNOSIS — R740 Nonspecific elevation of levels of transaminase and lactic acid dehydrogenase [LDH]: Secondary | ICD-10-CM

## 2017-03-12 LAB — HEPATIC FUNCTION PANEL
ALBUMIN: 3.9 g/dL (ref 3.6–4.8)
ALT: 41 IU/L — ABNORMAL HIGH (ref 0–32)
AST: 34 IU/L (ref 0–40)
Alkaline Phosphatase: 129 IU/L — ABNORMAL HIGH (ref 39–117)
BILIRUBIN, DIRECT: 0.05 mg/dL (ref 0.00–0.40)
TOTAL PROTEIN: 6.6 g/dL (ref 6.0–8.5)

## 2017-03-14 ENCOUNTER — Telehealth: Payer: Self-pay

## 2017-03-14 NOTE — Telephone Encounter (Signed)
Spoke with patient. Advised of results. Patient verbalizes understanding. States she was recently seen at [facility] on [address]. Has a follow up appointment with Levin Erp, PA on 03/27/2017 will speak with her at that appointment regarding results. Results have been sent to Lovett Sox, PA for review.  Routing to provider for final review. Patient agreeable to disposition. Will close encounter.

## 2017-03-14 NOTE — Telephone Encounter (Signed)
Patient returning Kaitlyn's call. °

## 2017-03-14 NOTE — Telephone Encounter (Signed)
Left message to call Kaitlyn at [phone]. 

## 2017-03-14 NOTE — Telephone Encounter (Signed)
-----  Message from Megan Salon, MD sent at 03/13/2017  4:06 PM EST ----- Please let Pt know her Alk phos and ALT as still mildly elevated.  I don't think this is anything bad but I would like her to see a GI.  She used to see Dr. Olevia Perches who has retired.  Does she have any preference for who she would like to see?  Thanks.

## 2017-03-27 ENCOUNTER — Ambulatory Visit (INDEPENDENT_AMBULATORY_CARE_PROVIDER_SITE_OTHER): Payer: 59 | Admitting: Physician Assistant

## 2017-03-27 ENCOUNTER — Other Ambulatory Visit (INDEPENDENT_AMBULATORY_CARE_PROVIDER_SITE_OTHER): Payer: 59

## 2017-03-27 ENCOUNTER — Encounter: Payer: Self-pay | Admitting: Physician Assistant

## 2017-03-27 VITALS — BP 124/80 | HR 109 | Ht 66.5 in | Wt 243.0 lb

## 2017-03-27 DIAGNOSIS — R1013 Epigastric pain: Secondary | ICD-10-CM

## 2017-03-27 DIAGNOSIS — R7989 Other specified abnormal findings of blood chemistry: Secondary | ICD-10-CM

## 2017-03-27 DIAGNOSIS — R945 Abnormal results of liver function studies: Secondary | ICD-10-CM

## 2017-03-27 LAB — IBC PANEL
Iron: 32 ug/dL — ABNORMAL LOW (ref 42–145)
SATURATION RATIOS: 7.2 % — AB (ref 20.0–50.0)
Transferrin: 317 mg/dL (ref 212.0–360.0)

## 2017-03-27 LAB — FERRITIN: FERRITIN: 10.1 ng/mL (ref 10.0–291.0)

## 2017-03-27 LAB — HEPATIC FUNCTION PANEL
ALBUMIN: 4 g/dL (ref 3.5–5.2)
ALK PHOS: 131 U/L — AB (ref 39–117)
ALT: 34 U/L (ref 0–35)
AST: 25 U/L (ref 0–37)
Bilirubin, Direct: 0.1 mg/dL (ref 0.0–0.3)
Total Bilirubin: 0.4 mg/dL (ref 0.2–1.2)
Total Protein: 7.3 g/dL (ref 6.0–8.3)

## 2017-03-27 LAB — PROTIME-INR
INR: 1 ratio (ref 0.8–1.0)
Prothrombin Time: 11.3 s (ref 9.6–13.1)

## 2017-03-27 MED ORDER — OMEPRAZOLE 20 MG PO CPDR: 20.0000 mg | DELAYED_RELEASE_CAPSULE | Freq: Every day | ORAL | 3 refills | Status: DC

## 2017-03-27 NOTE — Progress Notes (Signed)
Thank you for sending this case to me. I have reviewed the entire note, and the outlined plan seems appropriate.  Serologic workup as outlined is fine, but these minimally elevated LFTs are unlikely to be of clinical significance. Wilfrid Lund, MD

## 2017-03-27 NOTE — Patient Instructions (Signed)
You have been scheduled for an abdominal ultrasound at [facility] (1st floor of hospital) on 04/01/17 at 9:30 am. Please arrive 15 minutes prior to your appointment for registration. Make certain not to have anything to eat or drink after midnight prior to your appointment. Should you need to reschedule your appointment, please contact radiology at [phone]. This test typically takes about 30 minutes to perform.  Your physician has requested that you go to the basement for lab work before leaving today.

## 2017-03-27 NOTE — Progress Notes (Signed)
Chief Complaint: Epigastric pain, elevated LFTs  HPI:    Jasmine [name] is a 61 year old Caucasian female with a past medical history as listed below, who returns to clinic today for follow-up of her epigastric pain and a complaint of elevated LFTs.    Patient's most recent hepatic function panel was done 2 weeks ago and shows an Alkaline phosphatase elevated at 129, AST normal at 34 and ALT elevated at 41.,  1 month ago alk phos was 142, AST 34 and ALT 38, prior to this a year ago patient had normal LFTs.    Please recall patient was last seen in clinic by me on 810/31/18.  She regularly follows with Dr. Loletha Carrow.  At time of that visit the patient described that for the past 2 months when she ate she would swallow and feel like food hit her stomach and she would get a "warm feeling" that spread out to the center on both sides of her abdomen.  She also had occasional reflux symptoms.  Patient was started on Omeprazole 20 mg once daily.    Today, the patient tells me that she has had complete relief of her epigastric pain and reflux with Omeprazole 20 mg once daily.  She does have some questions regarding her continually elevated LFTs and what needs to be done next.  She also tells me that she was found to be "prediabetic" on recent labs by her PCP and questions what she can do for this.    Patient denies fever, chills, blood in her stool, melena, weight loss, anorexia, change in bowel habits, nausea, vomiting or symptoms that awaken her at night.  She also denies any history of hepatitis, blood transfusions or IV drug use as well as any family history of liver disease.  Past Medical History:  Diagnosis Date  . Anxiety   . Arthritis   . Depression   . Diverticulitis   . Diverticulosis   . GERD (gastroesophageal reflux disease)    tums only PRN  . Ovarian cyst   . Prediabetes   . Sleep apnea    no CPAP    Past Surgical History:  Procedure Laterality Date  . BLADDER SUSPENSION N/A 04/24/2013     Procedure: TRANSVAGINAL TAPE (TVT) Exact PROCEDURE;  Surgeon: Elveria Royals, MD;  Location: [facility];  Service: Gynecology;  Laterality: N/A;  . CARPAL TUNNEL RELEASE Bilateral 1997  . CYSTOSCOPY N/A 04/24/2013   Procedure: CYSTOSCOPY;  Surgeon: Elveria Royals, MD;  Location: [facility];  Service: Gynecology;  Laterality: N/A;  . JOINT REPLACEMENT Bilateral 04/2010  . LAPAROSCOPY Bilateral 05/03/2014   Procedure: LAPAROSCOPIC BILATERAL SALPINGO OOPHORECTOMY;  Surgeon: Lyman Speller, MD;  Location: [facility];  Service: Gynecology;  Laterality: Bilateral;  . TOTAL KNEE ARTHROPLASTY Bilateral 04/2010    Current Outpatient Medications  Medication Sig Dispense Refill  . ALPRAZolam (XANAX) 0.5 MG tablet Take 1 tablet by mouth daily.    Marland Kitchen amoxicillin (AMOXIL) 500 MG capsule Take 1 capsule (500 mg total) by mouth 3 (three) times daily. 30 capsule 0  . calcium carbonate (TUMS - DOSED IN MG ELEMENTAL CALCIUM) 500 MG chewable tablet Chew 2 tablets by mouth as needed for indigestion or heartburn.    . Calcium-Magnesium-Vitamin D (CALCIUM 1200+D3 PO) Take 1 tablet by mouth 2 (two) times daily.    Marland Kitchen FIBER FORMULA CAPS Take 1 capsule by mouth 2 (two) times daily.     . fish oil-omega-3 fatty acids 1000 MG capsule Take 1 g  by mouth daily.     Marland Kitchen imipramine (TOFRANIL) 50 MG tablet Take 50 mg by mouth Daily.    Marland Kitchen omeprazole (PRILOSEC) 20 MG capsule Take 1 capsule (20 mg total) by mouth daily. 90 capsule 3  . PARoxetine (PAXIL) 40 MG tablet Take 40 mg by mouth Daily.     No current facility-administered medications for this visit.     Allergies as of 03/27/2017  . (No Known Allergies)    Family History  Problem Relation Age of Onset  . Multiple myeloma Mother   . Cancer Mother   . Heart attack Father     Social History   Socioeconomic History  . Marital status: Married    Spouse name: Not on file  . Number of children: Not on file  . Years of education: Not on file  . Highest education level:  Not on file  Social Needs  . Financial resource strain: Not on file  . Food insecurity - worry: Not on file  . Food insecurity - inability: Not on file  . Transportation needs - medical: Not on file  . Transportation needs - non-medical: Not on file  Occupational History  . Not on file  Tobacco Use  . Smoking status: Former Smoker    Packs/day: 0.50    Types: Cigarettes    Last attempt to quit: 10/28/2016    Years since quitting: 0.4  . Smokeless tobacco: Never Used  . Tobacco comment: 1/2 PPD  Substance and Sexual Activity  . Alcohol use: Yes    Alcohol/week: 0.0 oz    Comment: 1 x per month  . Drug use: No  . Sexual activity: No    Partners: Male    Birth control/protection: None    Comment: vasectomy  Other Topics Concern  . Not on file  Social History Narrative  . Not on file    Review of Systems:    Constitutional: No weight loss, fever or chills Cardiovascular: No chest pain Respiratory: No SOB Gastrointestinal: See HPI and otherwise negative   Physical Exam:  Vital signs: BP 124/80   Pulse (!) 109   Ht 5' 6.5" (1.689 m)   Wt 243 lb (110.2 kg)   LMP 01/28/2010   SpO2 96%   BMI 38.63 kg/m   Constitutional:   Pleasant overweight Caucasian female appears to be in NAD, Well developed, Well nourished, alert and cooperative Respiratory: Respirations even and unlabored. Lungs clear to auscultation bilaterally.   No wheezes, crackles, or rhonchi.  Cardiovascular: Normal S1, S2. No MRG. Regular rate and rhythm. No peripheral edema, cyanosis or pallor.  Gastrointestinal:  Soft, nondistended, nontender. No rebound or guarding. Normal bowel sounds. No appreciable masses or hepatomegaly. Psychiatric:  Demonstrates good judgement and reason without abnormal affect or behaviors.  RELEVANT LABS AND IMAGING: CBC    Component Value Date/Time   WBC 7.4 11/07/2015 1519   RBC 4.81 11/07/2015 1519   HGB 12.3 11/07/2015 1519   HCT 38.6 11/07/2015 1519   PLT 357 11/07/2015  1519   MCV 80.2 11/07/2015 1519   MCV 94.1 03/09/2013 1020   MCH 25.6 (L) 11/07/2015 1519   MCHC 31.9 (L) 11/07/2015 1519   RDW 17.1 (H) 11/07/2015 1519   LYMPHSABS 1.9 11/01/2009 2314   MONOABS 0.7 11/01/2009 2314   EOSABS 0.2 11/01/2009 2314   BASOSABS 0.1 11/01/2009 2314    CMP     Component Value Date/Time   NA 139 02/01/2017 1504   K 4.7 02/01/2017 1504  CL 101 02/01/2017 1504   CO2 23 02/01/2017 1504   GLUCOSE 82 02/01/2017 1504   GLUCOSE 63 (L) 11/07/2015 1519   BUN 16 02/01/2017 1504   CREATININE 1.00 02/01/2017 1504   CREATININE 0.91 11/07/2015 1519   CALCIUM 9.6 02/01/2017 1504   PROT 6.6 03/11/2017 1004   ALBUMIN 3.9 03/11/2017 1004   AST 34 03/11/2017 1004   ALT 41 (H) 03/11/2017 1004   ALKPHOS 129 (H) 03/11/2017 1004   BILITOT <0.2 03/11/2017 1004   GFRNONAA 61 02/01/2017 1504   GFRAA 70 02/01/2017 1504    Assessment: 1.  Epigastric pain: Resolved with Omeprazole 20 mg daily, likely this was related to reflux/gastritis 2.  Elevated LFTs: Minimally elevated over the past month, likely this is related to fatty liver, but will complete further testing to consider autoimmune versus infectious cause versus other cause  Plan: 1.  Continue Omeprazole 20 mg once daily, 30-61mn before breakfast.  #90 with 3 refills 2.  Ordered liver serologies to include testing for hepatitis B and C as well as ANA, ASMA, alpha 1 antitrypsin, iron studies and PT/INR 3.  Ordered a right upper quadrant ultrasound. 4.  Would encourage the patient to start a slow and steady weight loss with a low sugar diet to help with her prediabetic state as well as increase exercise. 5.  Patient to return to clinic with me as directed after labs/imaging above and or sooner if necessary.  JEllouise Newer PA-C [facility] 03/27/2017, 11:46 AM

## 2017-03-28 DIAGNOSIS — Z23 Encounter for immunization: Secondary | ICD-10-CM | POA: Diagnosis not present

## 2017-03-29 ENCOUNTER — Other Ambulatory Visit: Payer: Self-pay

## 2017-03-29 DIAGNOSIS — D509 Iron deficiency anemia, unspecified: Secondary | ICD-10-CM

## 2017-03-29 LAB — ALPHA-1-ANTITRYPSIN: A-1 Antitrypsin, Ser: 167 mg/dL (ref 83–199)

## 2017-03-29 LAB — ANTI-SMOOTH MUSCLE ANTIBODY, IGG: Actin (Smooth Muscle) Antibody (IGG): <20 U (ref <20)

## 2017-03-29 LAB — HEPATITIS C ANTIBODY
HEP C AB: NONREACTIVE
SIGNAL TO CUT-OFF: 0.12 (ref <1.00)

## 2017-03-29 LAB — ANA: ANA: POSITIVE — AB

## 2017-03-29 LAB — MITOCHONDRIAL ANTIBODIES

## 2017-03-29 LAB — HEPATITIS B SURFACE ANTIGEN: HEP B S AG: NONREACTIVE

## 2017-03-29 LAB — HEPATITIS B SURFACE ANTIBODY,QUALITATIVE: HEP B S AB: NONREACTIVE

## 2017-03-29 LAB — ANTI-NUCLEAR AB-TITER (ANA TITER): ANA Titer 1: 1:80 {titer} — ABNORMAL HIGH

## 2017-04-01 ENCOUNTER — Ambulatory Visit (HOSPITAL_COMMUNITY)
Admission: RE | Admit: 2017-04-01 | Discharge: 2017-04-01 | Disposition: A | Discharge status: Home / Self Care | Payer: 59 | Source: Ambulatory Visit | Attending: Physician Assistant | Admitting: Physician Assistant

## 2017-04-01 DIAGNOSIS — R7989 Other specified abnormal findings of blood chemistry: Secondary | ICD-10-CM

## 2017-04-01 DIAGNOSIS — R1013 Epigastric pain: Secondary | ICD-10-CM | POA: Insufficient documentation

## 2017-04-01 DIAGNOSIS — R945 Abnormal results of liver function studies: Secondary | ICD-10-CM | POA: Diagnosis not present

## 2017-04-04 ENCOUNTER — Ambulatory Visit: Payer: 59 | Admitting: Nurse Practitioner

## 2017-06-05 ENCOUNTER — Encounter: Payer: Self-pay | Admitting: Family Medicine

## 2017-06-05 ENCOUNTER — Ambulatory Visit (INDEPENDENT_AMBULATORY_CARE_PROVIDER_SITE_OTHER): Payer: 59 | Admitting: Family Medicine

## 2017-06-05 VITALS — BP 126/78 | HR 98 | Temp 98.1°F | Resp 12 | Ht 66.5 in | Wt 248.5 lb

## 2017-06-05 DIAGNOSIS — R740 Nonspecific elevation of levels of transaminase and lactic acid dehydrogenase [LDH]: Secondary | ICD-10-CM | POA: Diagnosis not present

## 2017-06-05 DIAGNOSIS — G4733 Obstructive sleep apnea (adult) (pediatric): Secondary | ICD-10-CM

## 2017-06-05 DIAGNOSIS — D509 Iron deficiency anemia, unspecified: Secondary | ICD-10-CM

## 2017-06-05 DIAGNOSIS — R110 Nausea: Secondary | ICD-10-CM | POA: Diagnosis not present

## 2017-06-05 DIAGNOSIS — D649 Anemia, unspecified: Secondary | ICD-10-CM | POA: Insufficient documentation

## 2017-06-05 DIAGNOSIS — E785 Hyperlipidemia, unspecified: Secondary | ICD-10-CM | POA: Diagnosis not present

## 2017-06-05 DIAGNOSIS — E669 Obesity, unspecified: Secondary | ICD-10-CM

## 2017-06-05 DIAGNOSIS — F4323 Adjustment disorder with mixed anxiety and depressed mood: Secondary | ICD-10-CM | POA: Diagnosis not present

## 2017-06-05 DIAGNOSIS — K219 Gastro-esophageal reflux disease without esophagitis: Secondary | ICD-10-CM | POA: Diagnosis not present

## 2017-06-05 DIAGNOSIS — R7401 Elevation of levels of liver transaminase levels: Secondary | ICD-10-CM | POA: Insufficient documentation

## 2017-06-05 LAB — CBC WITH DIFFERENTIAL/PLATELET
BASOS ABS: 0.1 10*3/uL (ref 0.0–0.1)
Basophils Relative: 1.2 % (ref 0.0–3.0)
EOS PCT: 2.1 % (ref 0.0–5.0)
Eosinophils Absolute: 0.1 10*3/uL (ref 0.0–0.7)
HEMATOCRIT: 33.8 % — AB (ref 36.0–46.0)
Hemoglobin: 10.4 g/dL — ABNORMAL LOW (ref 12.0–15.0)
LYMPHS PCT: 14.6 % (ref 12.0–46.0)
Lymphs Abs: 1 10*3/uL (ref 0.7–4.0)
MCHC: 30.7 g/dL (ref 30.0–36.0)
MCV: 70.4 fl — ABNORMAL LOW (ref 78.0–100.0)
Monocytes Absolute: 0.4 10*3/uL (ref 0.1–1.0)
Monocytes Relative: 6.1 % (ref 3.0–12.0)
Neutro Abs: 5 10*3/uL (ref 1.4–7.7)
Neutrophils Relative %: 76 % (ref 43.0–77.0)
Platelets: 387 10*3/uL (ref 150.0–400.0)
RBC: 4.81 Mil/uL (ref 3.87–5.11)
RDW: 19.6 % — ABNORMAL HIGH (ref 11.5–15.5)
WBC: 6.5 10*3/uL (ref 4.0–10.5)

## 2017-06-05 LAB — IBC PANEL
IRON: 26 ug/dL — AB (ref 42–145)
Saturation Ratios: 6 % — ABNORMAL LOW (ref 20.0–50.0)
TRANSFERRIN: 309 mg/dL (ref 212.0–360.0)

## 2017-06-05 LAB — LIPID PANEL
CHOL/HDL RATIO: 3
Cholesterol: 180 mg/dL (ref 0–200)
HDL: 54.8 mg/dL (ref >39.00)
LDL Cholesterol: 110 mg/dL — ABNORMAL HIGH (ref 0–99)
NONHDL: 125.35
TRIGLYCERIDES: 79 mg/dL (ref 0.0–149.0)
VLDL: 15.8 mg/dL (ref 0.0–40.0)

## 2017-06-05 LAB — COMPREHENSIVE METABOLIC PANEL
ALK PHOS: 127 U/L — AB (ref 39–117)
ALT: 33 U/L (ref 0–35)
AST: 24 U/L (ref 0–37)
Albumin: 3.8 g/dL (ref 3.5–5.2)
BILIRUBIN TOTAL: 0.4 mg/dL (ref 0.2–1.2)
BUN: 19 mg/dL (ref 6–23)
CALCIUM: 9.4 mg/dL (ref 8.4–10.5)
CO2: 29 meq/L (ref 19–32)
CREATININE: 0.97 mg/dL (ref 0.40–1.20)
Chloride: 102 mEq/L (ref 96–112)
GFR: 61.75 mL/min (ref >60.00)
Glucose, Bld: 114 mg/dL — ABNORMAL HIGH (ref 70–99)
Potassium: 4.2 mEq/L (ref 3.5–5.1)
Sodium: 137 mEq/L (ref 135–145)
TOTAL PROTEIN: 6.6 g/dL (ref 6.0–8.3)

## 2017-06-05 MED ORDER — OMEPRAZOLE 40 MG PO CPDR: 40.0000 mg | DELAYED_RELEASE_CAPSULE | Freq: Every day | ORAL | 2 refills | Status: DC

## 2017-06-05 MED ORDER — ZOSTER VAC RECOMB ADJUVANTED 50 MCG/0.5ML IM SUSR: INTRAMUSCULAR | 1 refills | Status: DC

## 2017-06-05 NOTE — Assessment & Plan Note (Signed)
Further recommendations will be given according to lab results. 

## 2017-06-05 NOTE — Assessment & Plan Note (Signed)
We discussed benefits of wt loss as well as adverse effects of obesity. Consistency with healthy diet and physical activity recommended. Daily brisk walking for 15-30 min as tolerated.

## 2017-06-05 NOTE — Assessment & Plan Note (Addendum)
Possible etiologies discussed. Further recommendations will be given according to lab results. Abdomen US 18 03/2017 did not show fatty liver or other abnormality. This is not a new problem so I am assuming she already had hepatitis screening.  Before I order further workup I will review prior labs. We will continue following.

## 2017-06-05 NOTE — Patient Instructions (Addendum)
A few things to remember from today's visit:   Nausea without vomiting - Plan: Comprehensive metabolic panel  Elevated transaminase level - Plan: Comprehensive metabolic panel  Hyperlipidemia, unspecified hyperlipidemia type - Plan: Lipid panel  Iron deficiency anemia, unspecified iron deficiency anemia type - Plan: CBC with Differential/Platelet, IBC panel  Gastroesophageal reflux disease, esophagitis presence not specified  OSA (obstructive sleep apnea)  Daily brisk walking for 15-20 minutes as tolerated recommended. Continue following with orthopedist. Continue following with psychiatrist.  I will see you back in a year, before depending on lab results.    Fat and Cholesterol Restricted Diet Getting too much fat and cholesterol in your diet may cause health problems. Following this diet helps keep your fat and cholesterol at normal levels. This can keep you from getting sick. What types of fat should I choose?  Choose monosaturated and polyunsaturated fats. These are found in foods such as olive oil, canola oil, flaxseeds, walnuts, almonds, and seeds.  Eat more omega-3 fats. Good choices include salmon, mackerel, sardines, tuna, flaxseed oil, and ground flaxseeds.  Limit saturated fats. These are in animal products such as meats, butter, and cream. They can also be in plant products such as palm oil, palm kernel oil, and coconut oil.  Avoid foods with partially hydrogenated oils in them. These contain trans fats. Examples of foods that have trans fats are stick margarine, some tub margarines, cookies, crackers, and other baked goods. What general guidelines do I need to follow?  Check food labels. Look for the words "trans fat" and "saturated fat."  When preparing a meal: ? Fill half of your plate with vegetables and green salads. ? Fill one fourth of your plate with whole grains. Look for the word "whole" as the first word in the ingredient list. ? Fill one fourth of your  plate with lean protein foods.  Eat more foods that have fiber, like apples, carrots, beans, peas, and barley.  Eat more home-cooked foods. Eat less at restaurants and buffets.  Limit or avoid alcohol.  Limit foods high in starch and sugar.  Limit fried foods.  Cook foods without frying them. Baking, boiling, grilling, and broiling are all great options.  Lose weight if you are overweight. Losing even a small amount of weight can help your overall health. It can also help prevent diseases such as diabetes and heart disease. What foods can I eat? Grains Whole grains, such as whole wheat or whole grain breads, crackers, cereals, and pasta. Unsweetened oatmeal, bulgur, barley, quinoa, or brown rice. Corn or whole wheat flour tortillas. Vegetables Fresh or frozen vegetables (raw, steamed, roasted, or grilled). Green salads. Fruits All fresh, canned (in natural juice), or frozen fruits. Meat and Other Protein Products Ground beef (85% or leaner), grass-fed beef, or beef trimmed of fat. Skinless chicken or [place]. Ground chicken or [place]. Pork trimmed of fat. All fish and seafood. Eggs. Dried beans, peas, or lentils. Unsalted nuts or seeds. Unsalted canned or dry beans. Dairy Low-fat dairy products, such as skim or 1% milk, 2% or reduced-fat cheeses, low-fat ricotta or cottage cheese, or plain low-fat yogurt. Fats and Oils Tub margarines without trans fats. Light or reduced-fat mayonnaise and salad dressings. Avocado. Olive, canola, sesame, or safflower oils. Natural peanut or almond butter (choose ones without added sugar and oil). The items listed above may not be a complete list of recommended foods or beverages. Contact your dietitian for more options. What foods are not recommended? Grains White bread. White pasta. White  rice. Cornbread. Bagels, pastries, and croissants. Crackers that contain trans fat. Vegetables White potatoes. Corn. Creamed or fried vegetables. Vegetables in a  cheese sauce. Fruits Dried fruits. Canned fruit in light or heavy syrup. Fruit juice. Meat and Other Protein Products Fatty cuts of meat. Ribs, chicken wings, bacon, sausage, bologna, salami, chitterlings, fatback, hot dogs, bratwurst, and packaged luncheon meats. Liver and organ meats. Dairy Whole or 2% milk, cream, half-and-half, and cream cheese. Whole milk cheeses. Whole-fat or sweetened yogurt. Full-fat cheeses. Nondairy creamers and whipped toppings. Processed cheese, cheese spreads, or cheese curds. Sweets and Desserts Corn syrup, sugars, honey, and molasses. Candy. Jam and jelly. Syrup. Sweetened cereals. Cookies, pies, cakes, donuts, muffins, and ice cream. Fats and Oils Butter, stick margarine, lard, shortening, ghee, or bacon fat. Coconut, palm kernel, or palm oils. Beverages Alcohol. Sweetened drinks (such as sodas, lemonade, and fruit drinks or punches). The items listed above may not be a complete list of foods and beverages to avoid. Contact your dietitian for more information. This information is not intended to replace advice given to you by your health care provider. Make sure you discuss any questions you have with your health care provider. Document Released: 10/16/2011 Document Revised: 12/22/2015 Document Reviewed: 07/16/2013 Elsevier Interactive Patient Education  [employer].  Please be sure medication list is accurate. If a new problem present, please set up appointment sooner than planned today.

## 2017-06-05 NOTE — Assessment & Plan Note (Signed)
For now I recommend nonpharmacologic treatment with low fat diet and regular exercise. Further recommendation will be given according to lab results.

## 2017-06-05 NOTE — Assessment & Plan Note (Signed)
Reported as well controlled with current management. Xanax could aggravate abnormal LFT's as well as Imipramine. She will continue following with Dr.Kaur.

## 2017-06-05 NOTE — Assessment & Plan Note (Signed)
Nausea she is reporting could be related to GERD. I recommend increasing dose of Omeprazole from 20 mg to 40 mg and monitor for changes. She follows with GI regularly. Instructed about warning signs. GERD precautions also recommended

## 2017-06-05 NOTE — Progress Notes (Signed)
HPI:   Jasmine [name] is a 62 y.o. female, who is here today to establish care.  Former PCP: N/A Last preventive routine visit: 01/2017  Chronic medical problems: Elevated LFTs, she follows with GI. HLD,anxiety,depression, GERD, OSA,and anemia.  She follows with Dr Terrial Rhodes.   OSA,she does not wear CPAP,did not tolerate it.   HLD:  She is currently on non pharmacologic treatment.  Elevated LFT's, she drink alcohol "rarely."  Alk phosphatase 02/2017 was 131 (129).  Abdominal US 03/2017 negative.  Concerns today: "Weird flu like symptoms" ,saliva "increased" intermittently ,about 3 times in a month. Nausea usually in the morning and around 1 pm.   Denies abdominal pain,vomiting, changes in bowel habits, blood in stool or melena.  Prilosec caused sore throat. She has not identified exacerbating or alleviating factors.  Anemia,not aware of cause and she is not on iron supplementation. Colonoscopy in 2012. She has not noted gross hematuria,nose/gum bleed,or increased bruising.  Review of Systems  Constitutional: Positive for fatigue. Negative for activity change, appetite change and fever.  HENT: Negative for mouth sores, nosebleeds, sore throat and trouble swallowing.   Eyes: Negative for redness and visual disturbance.  Respiratory: Negative for cough, shortness of breath and wheezing.   Cardiovascular: Negative for chest pain, palpitations and leg swelling.  Gastrointestinal: Positive for nausea. Negative for abdominal pain, blood in stool and vomiting.       Negative for changes in bowel habits.  Endocrine: Negative for cold intolerance, heat intolerance, polydipsia, polyphagia and polyuria.  Genitourinary: Negative for decreased urine volume, dysuria and hematuria.  Neurological: Negative for syncope, weakness and headaches.  Hematological: Negative for adenopathy. Does not bruise/bleed easily.  Psychiatric/Behavioral: Negative for  confusion. The patient is nervous/anxious.       Current Outpatient Medications on File Prior to Visit  Medication Sig Dispense Refill  . ALPRAZolam (XANAX) 0.5 MG tablet Take 1 tablet by mouth daily.    Marland Kitchen amoxicillin (AMOXIL) 500 MG capsule Take 1 capsule (500 mg total) by mouth 3 (three) times daily. 30 capsule 0  . calcium carbonate (TUMS - DOSED IN MG ELEMENTAL CALCIUM) 500 MG chewable tablet Chew 2 tablets by mouth as needed for indigestion or heartburn.    . Calcium-Magnesium-Vitamin D (CALCIUM 1200+D3 PO) Take 1 tablet by mouth 2 (two) times daily.    Marland Kitchen docusate sodium (COLACE) 100 MG capsule Take 100 mg by mouth daily.    Marland Kitchen FIBER FORMULA CAPS Take 1 capsule by mouth 2 (two) times daily.     . fish oil-omega-3 fatty acids 1000 MG capsule Take 1 g by mouth daily.     Marland Kitchen imipramine (TOFRANIL) 50 MG tablet Take 50 mg by mouth Daily.    Marland Kitchen PARoxetine (PAXIL) 40 MG tablet Take 40 mg by mouth Daily.     No current facility-administered medications on file prior to visit.      Past Medical History:  Diagnosis Date  . Anxiety   . Arthritis   . Depression   . Diverticulitis   . Diverticulosis   . GERD (gastroesophageal reflux disease)    tums only PRN  . Ovarian cyst   . Prediabetes   . Sleep apnea    no CPAP   No Known Allergies  Family History  Problem Relation Age of Onset  . Multiple myeloma Mother   . Cancer Mother   . Early death Mother   . Heart attack Father   . Alcohol abuse Father   .  COPD Father   . Cancer Maternal Grandmother   . Alcohol abuse Maternal Grandfather   . Depression Maternal Grandfather   . Heart disease Paternal Grandmother     Social History   Socioeconomic History  . Marital status: Married    Spouse name: None  . Number of children: 2  . Years of education: None  . Highest education level: None  Social Needs  . Financial resource strain: None  . Food insecurity - worry: None  . Food insecurity - inability: None  . Transportation  needs - medical: None  . Transportation needs - non-medical: None  Occupational History  . None  Tobacco Use  . Smoking status: Former Smoker    Packs/day: 0.50    Types: Cigarettes    Last attempt to quit: 10/28/2016    Years since quitting: 0.6  . Smokeless tobacco: Never Used  . Tobacco comment: 1/2 PPD  Substance and Sexual Activity  . Alcohol use: Yes    Alcohol/week: 0.0 oz    Comment: 1 x per month  . Drug use: No  . Sexual activity: No    Partners: Male    Birth control/protection: None    Comment: vasectomy  Other Topics Concern  . None  Social History Narrative  . None    Vitals:   06/05/17 0750  BP: 126/78  Pulse: 98  Resp: 12  Temp: 98.1 F (36.7 C)  SpO2: 96%    Body mass index is 39.51 kg/m.   Physical Exam  Nursing note and vitals reviewed. Constitutional: She is oriented to person, place, and time. She appears well-developed. No distress.  HENT:  Head: Normocephalic and atraumatic.  Mouth/Throat: Oropharynx is clear and moist and mucous membranes are normal.  Eyes: Conjunctivae are normal. Pupils are equal, round, and reactive to light.  Cardiovascular: Normal rate and regular rhythm.  No murmur heard. Pulses:      Dorsalis pedis pulses are 2+ on the right side, and 2+ on the left side.  Respiratory: Effort normal and breath sounds normal. No respiratory distress.  GI: Soft. She exhibits no mass. There is no hepatomegaly. There is no tenderness.  Musculoskeletal: She exhibits no edema or tenderness.  Lymphadenopathy:    She has no cervical adenopathy.  Neurological: She is alert and oriented to person, place, and time. She has normal strength. Gait normal.  Skin: Skin is warm. No rash noted. No erythema.  Psychiatric: Her mood appears anxious.  Well groomed, good eye contact.      ASSESSMENT AND PLAN:   Jasmine [name] was seen today for establish care.  Diagnoses and all orders for this visit:  Nausea without vomiting  Possible causes  dicussed. ? GERD,dyspepsia,anxiety among some. Further recommendations will be given according to lab results.  -     Comprehensive metabolic panel   GERD (gastroesophageal reflux disease) Nausea she is reporting could be related to GERD. I recommend increasing dose of Omeprazole from 20 mg to 40 mg and monitor for changes. She follows with GI regularly. Instructed about warning signs. GERD precautions also recommended  Elevated transaminase level Possible etiologies discussed. Further recommendations will be given according to lab results. Abdomen US 18 03/2017 did not show fatty liver or other abnormality. This is not a new problem so I am assuming she already had hepatitis screening.  Before I order further workup I will review prior labs. We will continue following.  Anemia, unspecified Further recommendations will be given according to lab results.  Adjustment  disorder with mixed anxiety and depressed mood Reported as well controlled with current management. Xanax could aggravate abnormal LFT's as well as Imipramine. She will continue following with Dr.Kaur.  Hyperlipidemia For now I recommend nonpharmacologic treatment with low fat diet and regular exercise. Further recommendation will be given according to lab results.  Obesity with body mass index (BMI) of 30.0 to 39.9 We discussed benefits of wt loss as well as adverse effects of obesity. Consistency with healthy diet and physical activity recommended. Daily brisk walking for 15-30 min as tolerated.   OSA (obstructive sleep apnea) We discussed physiopathology of OSA and adverse effects when is not treated appropriately. For now she is not interested in repeating sleep study, she tells me that she will let me know if she decides to do so. Weight loss might also help.    She thinks she is up-to-date with all her vaccines, including Tdap.  She has a new grandson, strongly recommend being sure she is up-to-date with  vaccination. Rx for  Zoster Vaccine given: Adjuvanted [facility]) injection; 0.5 ml in muscle and repeat in 8 weeks Influenza vaccine up-to-date. Colonoscopy 03/2011. She will continue following with gynecologist for her female preventive care.            Arion Morgan G. Martinique, MD  [facility]. [place] office.

## 2017-06-05 NOTE — Assessment & Plan Note (Signed)
We discussed physiopathology of OSA and adverse effects when is not treated appropriately. For now she is not interested in repeating sleep study, she tells me that she will let me know if she decides to do so. Weight loss might also help.

## 2017-06-06 ENCOUNTER — Telehealth: Payer: Self-pay | Admitting: Family Medicine

## 2017-06-06 NOTE — Telephone Encounter (Signed)
Copied from [place]. Topic: Quick Communication - See Telephone Encounter >> Jun 06, 2017  4:38 PM Cleaster Corin, NT wrote: CRM for notification. See Telephone encounter for:   06/06/17. Pt. Calling back to receive lab results pt. Can be reached at [phone]

## 2017-06-07 NOTE — Telephone Encounter (Signed)
Patient will be called with lab results when Dr. Martinique reviews them.

## 2017-06-11 ENCOUNTER — Telehealth: Payer: Self-pay | Admitting: Family Medicine

## 2017-06-11 NOTE — Telephone Encounter (Signed)
Copied from [place]. Topic: Quick Communication - See Telephone Encounter >> Jun 11, 2017 11:03 AM Cleaster Corin, NT wrote: CRM for notification. See Telephone encounter for:   06/11/17. Pt. Calling to ask about lab results and what will be the next step. Pt. Would like to hear from Dr. Martinique or nurse today concerning this matter.pt. Can be reached at [phone]

## 2017-06-11 NOTE — Telephone Encounter (Signed)
Spoke with patient about labs. Patient verbalized understanding.

## 2017-06-13 ENCOUNTER — Telehealth: Payer: Self-pay

## 2017-06-13 NOTE — Telephone Encounter (Signed)
Please advise 

## 2017-06-13 NOTE — Telephone Encounter (Signed)
It is ok to transfer care.

## 2017-06-13 NOTE — Telephone Encounter (Signed)
Copied from [place] [phone]. Topic: Appointment Scheduling - Scheduling Inquiry for Clinic >> Jun 13, 2017 10:43 AM Clack, Laban Emperor wrote: Reason for CRM: Pt is a pt of Jasmine. Martinique at [facility] Jasmine [name], she would like to know if she can transfer providers. She states she is not happy about her labs and the hoops she had to go thru to get them. She would like to est care with Jasmine [name] at the [facility]. Please f/u with pt if she is able to transfer care.

## 2017-06-14 NOTE — Telephone Encounter (Signed)
Pt has a voicemail that has not been set up so we were unable to schedule appt.

## 2017-06-14 NOTE — Telephone Encounter (Signed)
Please inform Pt of Dr. Lillie Fragmin decision and schedule transfer appt/NP appt at her convenience. Thank you.

## 2017-06-14 NOTE — Telephone Encounter (Signed)
That is fine but she will have to wait and be seen when I have a new patient appt open

## 2017-06-19 NOTE — Telephone Encounter (Signed)
Please give her a call- I understand, but I did specify when I accepted her as a transfer new patient that she would need to wait until I have a new patient spot available.   Would she like to see Dr. Martinique for a follow-up in the meantime?

## 2017-06-19 NOTE — Telephone Encounter (Signed)
Pt called in to scheduled new pt/transfer to Dr. Lorelei Pont. First available is 10/30/17 and I have added pt to wait list. Pt was recommended to f/u in 3 months after her OV 06/05/17. Pt is concerned about lab results that were released to mychart and doesn't feel she has adequate answers as to the course of action she should follow. She states GI recommended her to get a PCP for managing low iron and is asking for feedback from Dr. Lorelei Pont prior to 10/30/17 appt. Pt call back [phone].

## 2017-06-19 NOTE — Telephone Encounter (Signed)
Please advise 

## 2017-06-20 NOTE — Telephone Encounter (Signed)
Tried to contact pt to give message from provider. Unable to leave message voicemail box is full. If pt returns call it is ok to give message from provider. See previous message.

## 2017-07-12 ENCOUNTER — Other Ambulatory Visit (INDEPENDENT_AMBULATORY_CARE_PROVIDER_SITE_OTHER): Payer: 59

## 2017-07-12 ENCOUNTER — Ambulatory Visit (INDEPENDENT_AMBULATORY_CARE_PROVIDER_SITE_OTHER): Payer: 59 | Admitting: Physician Assistant

## 2017-07-12 ENCOUNTER — Encounter: Payer: Self-pay | Admitting: Physician Assistant

## 2017-07-12 VITALS — BP 122/78 | HR 98 | Ht 67.0 in | Wt 247.0 lb

## 2017-07-12 DIAGNOSIS — D509 Iron deficiency anemia, unspecified: Secondary | ICD-10-CM | POA: Diagnosis not present

## 2017-07-12 DIAGNOSIS — R110 Nausea: Secondary | ICD-10-CM | POA: Diagnosis not present

## 2017-07-12 DIAGNOSIS — K219 Gastro-esophageal reflux disease without esophagitis: Secondary | ICD-10-CM

## 2017-07-12 DIAGNOSIS — D729 Disorder of white blood cells, unspecified: Secondary | ICD-10-CM

## 2017-07-12 LAB — FERRITIN: Ferritin: 41.6 ng/mL (ref 10.0–291.0)

## 2017-07-12 LAB — CBC WITH DIFFERENTIAL/PLATELET
Basophils Absolute: 0.2 10*3/uL — ABNORMAL HIGH (ref 0.0–0.1)
Basophils Relative: 2.3 % (ref 0.0–3.0)
EOS PCT: 2.1 % (ref 0.0–5.0)
Eosinophils Absolute: 0.1 10*3/uL (ref 0.0–0.7)
HEMATOCRIT: 40.3 % (ref 36.0–46.0)
HEMOGLOBIN: 12.7 g/dL (ref 12.0–15.0)
LYMPHS PCT: 20.2 % (ref 12.0–46.0)
Lymphs Abs: 1.4 10*3/uL (ref 0.7–4.0)
MCHC: 31.5 g/dL (ref 30.0–36.0)
MCV: 78 fl (ref 78.0–100.0)
MONOS PCT: 7.5 % (ref 3.0–12.0)
Monocytes Absolute: 0.5 10*3/uL (ref 0.1–1.0)
Neutro Abs: 4.6 10*3/uL (ref 1.4–7.7)
Neutrophils Relative %: 67.9 % (ref 43.0–77.0)
Platelets: 295 10*3/uL (ref 150.0–400.0)
RBC: 5.17 Mil/uL — AB (ref 3.87–5.11)
RDW: 29.2 % — ABNORMAL HIGH (ref 11.5–15.5)
WBC: 6.8 10*3/uL (ref 4.0–10.5)

## 2017-07-12 LAB — IBC PANEL
Iron: 32 ug/dL — ABNORMAL LOW (ref 42–145)
SATURATION RATIOS: 9.1 % — AB (ref 20.0–50.0)
Transferrin: 252 mg/dL (ref 212.0–360.0)

## 2017-07-12 MED ORDER — RANITIDINE HCL 150 MG PO TABS: ORAL_TABLET | ORAL | 2 refills | Status: DC

## 2017-07-12 NOTE — Patient Instructions (Addendum)
Please go to the basement level to have your labs drawn.  We have sent the following medications to your pharmacy for you to pick up at your convenience: Zantac 150 mg.  Appointment made for 5-1- 2019 with Ellouise Newer PA-C.  If you are age 63 or younger, your body mass index should be between 19-25. Your Body mass index is 38.69 kg/m. If this is out of the aformentioned range listed, please consider follow up with your Primary Care Provider.

## 2017-07-12 NOTE — Progress Notes (Signed)
Chief Complaint: GERD and new IDA  HPI:    Jasmine [name] is a 62 year old Caucasian female, known to Dr. Loletha Carrow, with a past medical history as listed below, who presents to clinic today with a complaint of reflux and IDA.      Last OV 03/27/17 for elevated LFTs.  Epigastric pain had resolved with Omeprazole 20 mg daily and elevated LFTs had a full serological workup which was negative/unrevealing.  Right upper quadrant ultrasound showed fatty liver which was thought cause of minimally elevated LFTs.    CBC 06/05/17 with a hemoglobin 10.4, previously 12.3 a year ago.  Iron studies showing iron low at 26 and percent saturation low at 6.  Patient started on 325 mg of iron p.o. Daily.    Today, explains that she had to stop her Prilosec as this was giving her mouth ulcers and lip sores and tongue swelling.  2-3 days after stopping the Prilosec she had resolution of all of the symptoms.  She continues with occasional reflux,  at least a 3-4 nights a week typically after dinner for which she takes Tums as needed.  Patient worried regarding long-term effects of this.    Also spends a large amount of time today explaining that she has had to change primary care providers and is worried that July is not soon enough for follow-up due to her anemia.  Also has some questions regarding recent labs.    Social history positive for daughter having a baby girl 4 mos ago.  They live in [place] and she is a happy grandparent.    Denies fever, chills, blood in her stool, melena, weight loss, fatigue, shortness of breath, abdominal pain or change in bowel habits.  Past Medical History:  Diagnosis Date  . Anxiety   . Arthritis   . Depression   . Diverticulitis   . Diverticulosis   . GERD (gastroesophageal reflux disease)    tums only PRN  . Ovarian cyst   . Prediabetes   . Sleep apnea    no CPAP    Past Surgical History:  Procedure Laterality Date  . ABDOMINAL HYSTERECTOMY     modified, tubes and ovaries  .  BLADDER SUSPENSION N/A 04/24/2013   Procedure: TRANSVAGINAL TAPE (TVT) Exact PROCEDURE;  Surgeon: Elveria Royals, MD;  Location: [facility];  Service: Gynecology;  Laterality: N/A;  . CARPAL TUNNEL RELEASE Bilateral 1997  . CYSTOSCOPY N/A 04/24/2013   Procedure: CYSTOSCOPY;  Surgeon: Elveria Royals, MD;  Location: [facility];  Service: Gynecology;  Laterality: N/A;  . JOINT REPLACEMENT Bilateral 04/2010  . LAPAROSCOPY Bilateral 05/03/2014   Procedure: LAPAROSCOPIC BILATERAL SALPINGO OOPHORECTOMY;  Surgeon: Lyman Speller, MD;  Location: [facility];  Service: Gynecology;  Laterality: Bilateral;  . TOTAL KNEE ARTHROPLASTY Bilateral 04/2010    Current Outpatient Medications  Medication Sig Dispense Refill  . ALPRAZolam (XANAX) 0.5 MG tablet Take 1 tablet by mouth daily.    Marland Kitchen amoxicillin (AMOXIL) 500 MG capsule Take 1 capsule (500 mg total) by mouth 3 (three) times daily. 30 capsule 0  . calcium carbonate (TUMS - DOSED IN MG ELEMENTAL CALCIUM) 500 MG chewable tablet Chew 2 tablets by mouth as needed for indigestion or heartburn.    . Calcium-Magnesium-Vitamin D (CALCIUM 1200+D3 PO) Take 1 tablet by mouth 2 (two) times daily.    Marland Kitchen docusate sodium (COLACE) 100 MG capsule Take 100 mg by mouth daily.    . ferrous sulfate (FEROSUL) 325 (65 FE) MG tablet Take 325  mg by mouth daily with breakfast.    . FIBER FORMULA CAPS Take 1 capsule by mouth 2 (two) times daily.     . fish oil-omega-3 fatty acids 1000 MG capsule Take 1 g by mouth daily.     Marland Kitchen imipramine (TOFRANIL) 50 MG tablet Take 50 mg by mouth Daily.    Marland Kitchen PARoxetine (PAXIL) 40 MG tablet Take 40 mg by mouth Daily.    Marland Kitchen Zoster Vaccine Adjuvanted [facility]) injection 0.5 ml in muscle and repeat in 8 weeks 0.5 mL 1   No current facility-administered medications for this visit.     Allergies as of 07/12/2017  . (No Known Allergies)    Family History  Problem Relation Age of Onset  . Multiple myeloma Mother   . Early death Mother   . Heart attack  Father   . Alcohol abuse Father   . COPD Father   . Cancer Maternal Grandmother   . Alcohol abuse Maternal Grandfather   . Depression Maternal Grandfather   . Heart disease Paternal Grandmother   . Colon cancer Neg Hx   . Stomach cancer Neg Hx     Social History   Socioeconomic History  . Marital status: Married    Spouse name: Not on file  . Number of children: 2  . Years of education: Not on file  . Highest education level: Not on file  Social Needs  . Financial resource strain: Not on file  . Food insecurity - worry: Not on file  . Food insecurity - inability: Not on file  . Transportation needs - medical: Not on file  . Transportation needs - non-medical: Not on file  Occupational History  . Not on file  Tobacco Use  . Smoking status: Former Smoker    Packs/day: 0.50    Types: Cigarettes    Last attempt to quit: 10/28/2016    Years since quitting: 0.7  . Smokeless tobacco: Never Used  . Tobacco comment: 1/2 PPD  Substance and Sexual Activity  . Alcohol use: Yes    Alcohol/week: 0.0 oz    Comment: 1 x per month  . Drug use: No  . Sexual activity: No    Partners: Male    Birth control/protection: None    Comment: vasectomy  Other Topics Concern  . Not on file  Social History Narrative  . Not on file    Review of Systems:    Constitutional: No weight loss, fever or chills Cardiovascular: No chest pain Respiratory: No SOB  Gastrointestinal: See HPI and otherwise negative   Physical Exam:  Vital signs: BP 122/78   Pulse 98   Ht _0  (1.702 m)   Wt 247 lb (112 kg)   LMP 01/28/2010   BMI 38.69 kg/m   Constitutional:   Pleasant Overweight Caucasian female appears to be in NAD, Well developed, Well nourished, alert and cooperative Respiratory: Respirations even and unlabored. Lungs clear to auscultation bilaterally.   No wheezes, crackles, or rhonchi.  Cardiovascular: Normal S1, S2. No MRG. Regular rate and rhythm. No peripheral edema, cyanosis or pallor.   Gastrointestinal:  Soft, nondistended, nontender. No rebound or guarding. Normal bowel sounds. No appreciable masses or hepatomegaly. Rectal:  Not performed.  Psychiatric: Demonstrates good judgement and reason without abnormal affect or behaviors.  RELEVANT LABS AND IMAGING: CBC    Component Value Date/Time   WBC 6.5 06/05/2017 0837   RBC 4.81 06/05/2017 0837   HGB 10.4 (L) 06/05/2017 0837   HCT 33.8 (L)  06/05/2017 0837   PLT 387.0 06/05/2017 0837   MCV 70.4 (L) 06/05/2017 0837   MCV 94.1 03/09/2013 1020   MCH 25.6 (L) 11/07/2015 1519   MCHC 30.7 06/05/2017 0837   RDW 19.6 (H) 06/05/2017 0837   LYMPHSABS 1.0 06/05/2017 0837   MONOABS 0.4 06/05/2017 0837   EOSABS 0.1 06/05/2017 0837   BASOSABS 0.1 06/05/2017 0837    CMP     Component Value Date/Time   NA 137 06/05/2017 0837   NA 139 02/01/2017 1504   K 4.2 06/05/2017 0837   CL 102 06/05/2017 0837   CO2 29 06/05/2017 0837   GLUCOSE 114 (H) 06/05/2017 0837   BUN 19 06/05/2017 0837   BUN 16 02/01/2017 1504   CREATININE 0.97 06/05/2017 0837   CREATININE 0.91 11/07/2015 1519   CALCIUM 9.4 06/05/2017 0837   PROT 6.6 06/05/2017 0837   PROT 6.6 03/11/2017 1004   ALBUMIN 3.8 06/05/2017 0837   ALBUMIN 3.9 03/11/2017 1004   AST 24 06/05/2017 0837   ALT 33 06/05/2017 0837   ALKPHOS 127 (H) 06/05/2017 0837   BILITOT 0.4 06/05/2017 0837   BILITOT <0.2 03/11/2017 1004   GFRNONAA 61 02/01/2017 1504   GFRAA 70 02/01/2017 1504    Ref Range & Units 45moago 340mogo  Iron 42 - 145 ug/dL 26 Abnormally low   32 Abnormally low    Transferrin 212.0 - 360.0 mg/dL 309.0  317.0   Saturation Ratios 20.0 - 50.0 % 6.0 Abnormally low   7.2 Abnormally low      Assessment: 1.  Iron deficiency anemia: New diagnosis, iron is as above, hemoglobin around 10, patient started on 325 mg iron daily recently by PCP 2.  GERD: Increase in symptoms since stopping Prilosec which is causing her mouth to break it out and ulcers in her tongue to  swell  Plan: 1.  Discussed with patient she should continue on iron supplementation 325 mg p.o. daily.  She does describe vague history of iron deficiency anemia even in her youth.  She has seen no overt signs of GI bleeding.  PCP is planning for FIT test per their notes.  Discussed with patient that she may need EGD/ colonoscopy in the future for further recommendations from her PCP in the future. 2.  We will recheck iron studies today including ferritin.  Also recheck CBC. 3.  Started patient on Zantac 150 mg before dinner daily.  #30 with 4 refills 4.  Discussed with patient that I would contact her new PCP Dr. CoEdilia Boo see if there is any way she can see her sooner than July 3. 5.  Patient to follow in clinic with me in 4-6 weeks or sooner if necessary.  JeEllouise NewerPA-C [facility] 07/12/2017, 2:46 PM  Cc: JoMartiniqueBetty G, MD

## 2017-07-15 LAB — PATHOLOGIST SMEAR REVIEW

## 2017-07-16 ENCOUNTER — Telehealth: Payer: Self-pay | Admitting: Family Medicine

## 2017-07-16 NOTE — Telephone Encounter (Signed)
-----   Message from Levin Erp, [place] sent at 07/12/2017  3:53 PM EDT ----- Regarding: Sooner appt? Hello Dr. Edilia Bo,   I am just writing you on behalf of one of my patients Mrs. Peppard. She is switching to you for PCP after she was unable to understand her previous PCP. She did have some difficulties with their office. She has a new IDA diagnosis and is on iron supplementation currently. I saw her in clinic today for reflux. Apparently she was told she could not get an appt with you until late July. Is there anyway you could see her sooner, possibly in early May as this would be 3 mos from initial dx of IDA. If her IDA continues, I told her we would be happy to see her back for possible EGD/Colo discussion.    I understand if you can not fit her in, but promised I would ask.  Thank you, Ellouise Newer, Pa-C

## 2017-07-16 NOTE — Telephone Encounter (Signed)
Tried calling at lunch time and again @4pm . Mailbox full and I cannot leave a message for pt to call us back.

## 2017-07-16 NOTE — Telephone Encounter (Signed)
Please schedule her for sometime in early May- thank you

## 2017-07-16 NOTE — Progress Notes (Signed)
Please order stool cards for occult blood. Thank you-JLL

## 2017-07-16 NOTE — Telephone Encounter (Signed)
Tried calling pt but her voicemail box was full and I was unable to leave a message. I will try to call her again this afternoon.

## 2017-07-16 NOTE — Progress Notes (Signed)
Thank you for sending this case to me. I have reviewed the entire note, and the outlined plan seems appropriate.  Thank you for checking her CBC and iron studies.  Please let her know that hemoglobin has normalized and iron levels improving. I think it would be best if our clinic made arrangements for her to have stool cards for occult blood.  Please see to that.  Wilfrid Lund, MD

## 2017-08-08 DIAGNOSIS — D2272 Melanocytic nevi of left lower limb, including hip: Secondary | ICD-10-CM | POA: Diagnosis not present

## 2017-08-08 DIAGNOSIS — D2261 Melanocytic nevi of right upper limb, including shoulder: Secondary | ICD-10-CM | POA: Diagnosis not present

## 2017-08-08 DIAGNOSIS — D485 Neoplasm of uncertain behavior of skin: Secondary | ICD-10-CM | POA: Diagnosis not present

## 2017-08-08 DIAGNOSIS — L821 Other seborrheic keratosis: Secondary | ICD-10-CM | POA: Diagnosis not present

## 2017-08-08 DIAGNOSIS — L814 Other melanin hyperpigmentation: Secondary | ICD-10-CM | POA: Diagnosis not present

## 2017-08-27 DIAGNOSIS — H00015 Hordeolum externum left lower eyelid: Secondary | ICD-10-CM | POA: Diagnosis not present

## 2017-08-28 ENCOUNTER — Encounter: Payer: Self-pay | Admitting: Physician Assistant

## 2017-08-28 ENCOUNTER — Other Ambulatory Visit: Payer: 59

## 2017-08-28 ENCOUNTER — Ambulatory Visit (INDEPENDENT_AMBULATORY_CARE_PROVIDER_SITE_OTHER): Payer: 59 | Admitting: Physician Assistant

## 2017-08-28 VITALS — BP 102/60 | HR 72 | Ht 66.5 in | Wt 241.5 lb

## 2017-08-28 DIAGNOSIS — D509 Iron deficiency anemia, unspecified: Secondary | ICD-10-CM

## 2017-08-28 DIAGNOSIS — R1013 Epigastric pain: Secondary | ICD-10-CM | POA: Diagnosis not present

## 2017-08-28 DIAGNOSIS — K219 Gastro-esophageal reflux disease without esophagitis: Secondary | ICD-10-CM

## 2017-08-28 MED ORDER — RANITIDINE HCL 150 MG PO TABS: ORAL_TABLET | ORAL | 2 refills | Status: DC

## 2017-08-28 NOTE — Progress Notes (Signed)
Chief Complaint: Follow-up reflux and iron deficiency anemia  HPI:    Jasmine [name] is a 62 year old female with a past medical history as listed below, known to Dr. Loletha Carrow, who presents to clinic today for follow-up of her reflux and iron deficiency anemia.    07/12/2017 office visit patient describes stopping Prilosec as it was giving her mouth ulcers and lip sores, continued with occasional reflux and 3-4 nights a week would use Tums.  Also described a new iron deficiency anemia which she was trying to get worked up by her primary care provider.  Labs showed a hemoglobin around 10 patient started on 325 mg of iron daily.  At that time started patient on Zantac 150 mg nightly and recheck iron studies as well as ferritin and CBC.    07/12/2017 CBC with a hemoglobin normal at 12.7.  Iron panel showed iron 32, percent saturation 9.1, ferritin normal.  Recommend patient stay on her iron daily.  Dr. Loletha Carrow also recommended fecal occult study.    Today, returns clinic and explains that she was never asked to do her fecal occult study.  Describes that the Zantac 150 mg nightly has been helping and she has had no breakthrough reflux symptoms.  Does describe an epigastric sensation up under her ribs maybe a couple times a day which lasts for 10 to 15 seconds at a time.  This is not painful but just a sensation that makes her aware of her abdomen.  Denies relation to food.  Denies any reflux or heartburn.    Grand-daughter is 65 mos old, recent stye in her eye, clapping her hands.    Does ask questions regarding follow-up of her iron.  She still has not been scheduled to see her new primary care provider until July.    Denies weight loss, fever, chills, melena, hematochezia or symptoms that awaken her from sleep.  Past Medical History:  Diagnosis Date  . Anxiety   . Arthritis   . Depression   . Diverticulitis   . Diverticulosis   . GERD (gastroesophageal reflux disease)    tums only PRN  . Ovarian cyst     . Prediabetes   . Sleep apnea    no CPAP    Past Surgical History:  Procedure Laterality Date  . ABDOMINAL HYSTERECTOMY     modified, tubes and ovaries  . BLADDER SUSPENSION N/A 04/24/2013   Procedure: TRANSVAGINAL TAPE (TVT) Exact PROCEDURE;  Surgeon: Elveria Royals, MD;  Location: [facility];  Service: Gynecology;  Laterality: N/A;  . CARPAL TUNNEL RELEASE Bilateral 1997  . CYSTOSCOPY N/A 04/24/2013   Procedure: CYSTOSCOPY;  Surgeon: Elveria Royals, MD;  Location: [facility];  Service: Gynecology;  Laterality: N/A;  . JOINT REPLACEMENT Bilateral 04/2010  . LAPAROSCOPY Bilateral 05/03/2014   Procedure: LAPAROSCOPIC BILATERAL SALPINGO OOPHORECTOMY;  Surgeon: Lyman Speller, MD;  Location: [facility];  Service: Gynecology;  Laterality: Bilateral;  . TOTAL KNEE ARTHROPLASTY Bilateral 04/2010    Current Outpatient Medications  Medication Sig Dispense Refill  . ALPRAZolam (XANAX) 0.5 MG tablet Take 1 tablet by mouth daily.    Marland Kitchen amoxicillin (AMOXIL) 500 MG capsule Take 1 capsule (500 mg total) by mouth 3 (three) times daily. 30 capsule 0  . calcium carbonate (TUMS - DOSED IN MG ELEMENTAL CALCIUM) 500 MG chewable tablet Chew 2 tablets by mouth as needed for indigestion or heartburn.    . Calcium-Magnesium-Vitamin D (CALCIUM 1200+D3 PO) Take 1 tablet by mouth 2 (two) times daily.    Marland Kitchen  docusate sodium (COLACE) 100 MG capsule Take 100 mg by mouth daily.    . ferrous sulfate (FEROSUL) 325 (65 FE) MG tablet Take 325 mg by mouth daily with breakfast.    . FIBER FORMULA CAPS Take 1 capsule by mouth 2 (two) times daily.     . fish oil-omega-3 fatty acids 1000 MG capsule Take 1 g by mouth daily.     Marland Kitchen imipramine (TOFRANIL) 50 MG tablet Take 50 mg by mouth Daily.    Marland Kitchen PARoxetine (PAXIL) 40 MG tablet Take 40 mg by mouth Daily.    . ranitidine (ZANTAC) 150 MG tablet Take 1 tablet by mouth before dinner. 270 tablet 2  . Zoster Vaccine Adjuvanted [facility]) injection 0.5 ml in muscle and repeat in 8 weeks 0.5  mL 1   No current facility-administered medications for this visit.     Allergies as of 08/28/2017  . (No Known Allergies)    Family History  Problem Relation Age of Onset  . Multiple myeloma Mother   . Early death Mother   . Heart attack Father   . Alcohol abuse Father   . COPD Father   . Cancer Maternal Grandmother   . Alcohol abuse Maternal Grandfather   . Depression Maternal Grandfather   . Heart disease Paternal Grandmother   . Colon cancer Neg Hx   . Stomach cancer Neg Hx     Social History   Socioeconomic History  . Marital status: Married    Spouse name: Not on file  . Number of children: 2  . Years of education: Not on file  . Highest education level: Not on file  Occupational History  . Not on file  Social Needs  . Financial resource strain: Not on file  . Food insecurity:    Worry: Not on file    Inability: Not on file  . Transportation needs:    Medical: Not on file    Non-medical: Not on file  Tobacco Use  . Smoking status: Former Smoker    Packs/day: 0.50    Types: Cigarettes    Last attempt to quit: 10/28/2016    Years since quitting: 0.8  . Smokeless tobacco: Never Used  . Tobacco comment: 1/2 PPD  Substance and Sexual Activity  . Alcohol use: Yes    Alcohol/week: 0.0 oz    Comment: 1 x per month  . Drug use: No  . Sexual activity: Never    Partners: Male    Birth control/protection: None    Comment: vasectomy  Lifestyle  . Physical activity:    Days per week: Not on file    Minutes per session: Not on file  . Stress: Not on file  Relationships  . Social connections:    Talks on phone: Not on file    Gets together: Not on file    Attends religious service: Not on file    Active member of club or organization: Not on file    Attends meetings of clubs or organizations: Not on file    Relationship status: Not on file  . Intimate partner violence:    Fear of current or ex partner: Not on file    Emotionally abused: Not on file     Physically abused: Not on file    Forced sexual activity: Not on file  Other Topics Concern  . Not on file  Social History Narrative  . Not on file    Review of Systems:    Constitutional: No weight  loss, fever or chills Cardiovascular: No chest pain   Respiratory: No SOB  Gastrointestinal: See HPI and otherwise negative   Physical Exam:  Vital signs: BP 102/60   Pulse 72   Ht 5' 6.5" (1.689 m)   Wt 241 lb 8 oz (109.5 kg)   LMP 01/28/2010   BMI 38.40 kg/m   Constitutional:   Pleasant overweight Caucasian female appears to be in NAD, Well developed, Well nourished, alert and cooperative Respiratory: Respirations even and unlabored. Lungs clear to auscultation bilaterally.   No wheezes, crackles, or rhonchi.  Cardiovascular: Normal S1, S2. No MRG. Regular rate and rhythm. No peripheral edema, cyanosis or pallor.  Gastrointestinal:  Soft, nondistended, nontender. No rebound or guarding. Normal bowel sounds. No appreciable masses or hepatomegaly. Psychiatric:  Demonstrates good judgement and reason without abnormal affect or behaviors.  RELEVANT LABS AND IMAGING: CBC    Component Value Date/Time   WBC 6.8 07/12/2017 1532   RBC 5.17 (H) 07/12/2017 1532   HGB 12.7 07/12/2017 1532   HCT 40.3 07/12/2017 1532   PLT 295.0 07/12/2017 1532   MCV 78.0 07/12/2017 1532   MCV 94.1 03/09/2013 1020   MCH 25.6 (L) 11/07/2015 1519   MCHC 31.5 07/12/2017 1532   RDW 29.2 (H) 07/12/2017 1532   LYMPHSABS 1.4 07/12/2017 1532   MONOABS 0.5 07/12/2017 1532   EOSABS 0.1 07/12/2017 1532   BASOSABS 0.2 (H) 07/12/2017 1532    CMP     Component Value Date/Time   NA 137 06/05/2017 0837   NA 139 02/01/2017 1504   K 4.2 06/05/2017 0837   CL 102 06/05/2017 0837   CO2 29 06/05/2017 0837   GLUCOSE 114 (H) 06/05/2017 0837   BUN 19 06/05/2017 0837   BUN 16 02/01/2017 1504   CREATININE 0.97 06/05/2017 0837   CREATININE 0.91 11/07/2015 1519   CALCIUM 9.4 06/05/2017 0837   PROT 6.6 06/05/2017  0837   PROT 6.6 03/11/2017 1004   ALBUMIN 3.8 06/05/2017 0837   ALBUMIN 3.9 03/11/2017 1004   AST 24 06/05/2017 0837   ALT 33 06/05/2017 0837   ALKPHOS 127 (H) 06/05/2017 0837   BILITOT 0.4 06/05/2017 0837   BILITOT <0.2 03/11/2017 1004   GFRNONAA 61 02/01/2017 1504   GFRAA 70 02/01/2017 1504    Assessment: 1.  IDA: Improving on once daily oral iron, stool occult study ordered today 2.  Epigastric pain: Describes a sensation in her upper abdomen 2-3 times a day for 15 seconds; Consider gas versus functional dyspepsia versus other 3.  GERD: Better on Zantac 150 mg daily  Plan: 1.  Increased Zantac to 150 mg twice daily.  Sent new prescription to [facility] 90-day prescription #270, with a refill.  Discussed that if this does not help with her epigastric sensation over the next month, would recommend that she decrease back down to Zantac just at night 2.  Ordered fecal occult blood study today 3.  Pending above we will consider EGD/colonoscopy 4.  We will follow-up with Dr. Edilia Bo in regards to a sooner appointment to establish care with the patient. 5.  Continue oral iron daily for now 6.  Recommend recheck of iron studies and ferritin in a month, orders were placed today 7.  Patient will follow in clinic as guided by stool studies/labs   Ellouise Newer, PA-C [facility] 08/28/2017, 3:50 PM  Cc: [place], Betty G, MD

## 2017-08-28 NOTE — Progress Notes (Signed)
Thank you for sending this case to me. I have reviewed the entire note, and the outlined plan seems appropriate.   Matheo Rathbone Danis, MD  

## 2017-08-28 NOTE — Patient Instructions (Signed)
We have sent the following medications to your pharmacy for you to pick up at your convenience:  Zantac 150 mg twice a day   Your provider has requested that you go to the basement level for lab work before leaving today. Press "B" on the elevator. The lab is located at the first door on the left as you exit the elevator.  Please come back to the lab in one month for repeat lab work. We will call you to remind you.

## 2017-08-30 ENCOUNTER — Telehealth: Payer: Self-pay | Admitting: Physician Assistant

## 2017-08-30 DIAGNOSIS — D509 Iron deficiency anemia, unspecified: Secondary | ICD-10-CM

## 2017-08-30 NOTE — Telephone Encounter (Signed)
The pt was advised to complete her stool studies and have labs in 1 month.  We will call her as soon as stool results are reviewed.

## 2017-09-03 ENCOUNTER — Other Ambulatory Visit (INDEPENDENT_AMBULATORY_CARE_PROVIDER_SITE_OTHER): Payer: 59

## 2017-09-03 DIAGNOSIS — D509 Iron deficiency anemia, unspecified: Secondary | ICD-10-CM

## 2017-09-03 LAB — FECAL OCCULT BLOOD, IMMUNOCHEMICAL: Fecal Occult Bld: POSITIVE — AB

## 2017-09-25 ENCOUNTER — Other Ambulatory Visit: Payer: Self-pay

## 2017-09-25 ENCOUNTER — Ambulatory Visit (AMBULATORY_SURGERY_CENTER): Payer: Self-pay | Admitting: *Deleted

## 2017-09-25 VITALS — Ht 67.0 in | Wt 243.0 lb

## 2017-09-25 DIAGNOSIS — R1013 Epigastric pain: Secondary | ICD-10-CM

## 2017-09-25 DIAGNOSIS — D509 Iron deficiency anemia, unspecified: Secondary | ICD-10-CM

## 2017-09-25 MED ORDER — NA SULFATE-K SULFATE-MG SULF 17.5-3.13-1.6 GM/177ML PO SOLN: ORAL | 0 refills | Status: DC

## 2017-09-25 NOTE — Progress Notes (Signed)
No egg or soy allergy  No home oxygen used  No anesthesia or intubation problems per pt  No diet medications taken  No intubation or anesthesia problems per pt  Registered in emmi  $15 off Suprep coupon given

## 2017-10-03 ENCOUNTER — Encounter: Payer: Self-pay | Admitting: Gastroenterology

## 2017-10-09 ENCOUNTER — Encounter: Payer: Self-pay | Admitting: Gastroenterology

## 2017-10-09 ENCOUNTER — Encounter

## 2017-10-09 ENCOUNTER — Other Ambulatory Visit: Payer: Self-pay

## 2017-10-09 ENCOUNTER — Ambulatory Visit (AMBULATORY_SURGERY_CENTER): Payer: 59 | Admitting: Gastroenterology

## 2017-10-09 VITALS — BP 126/80 | HR 92 | Temp 98.2°F | Resp 14 | Ht 66.0 in | Wt 241.0 lb

## 2017-10-09 DIAGNOSIS — D508 Other iron deficiency anemias: Secondary | ICD-10-CM

## 2017-10-09 DIAGNOSIS — D500 Iron deficiency anemia secondary to blood loss (chronic): Secondary | ICD-10-CM | POA: Diagnosis not present

## 2017-10-09 DIAGNOSIS — K229 Disease of esophagus, unspecified: Secondary | ICD-10-CM

## 2017-10-09 DIAGNOSIS — R195 Other fecal abnormalities: Secondary | ICD-10-CM | POA: Diagnosis not present

## 2017-10-09 DIAGNOSIS — D509 Iron deficiency anemia, unspecified: Secondary | ICD-10-CM | POA: Diagnosis not present

## 2017-10-09 MED ORDER — SODIUM CHLORIDE 0.9 % IV SOLN: 500.0000 mL | Freq: Once | INTRAVENOUS | Status: DC

## 2017-10-09 NOTE — Patient Instructions (Signed)
YOU HAD AN ENDOSCOPIC PROCEDURE TODAY AT [facility]:   Refer to the procedure report that was given to you for any specific questions about what was found during the examination.  If the procedure report does not answer your questions, please call your gastroenterologist to clarify.  If you requested that your care partner not be given the details of your procedure findings, then the procedure report has been included in a sealed envelope for you to review at your convenience later.  YOU SHOULD EXPECT: Some feelings of bloating in the abdomen. Passage of more gas than usual.  Walking can help get rid of the air that was put into your GI tract during the procedure and reduce the bloating. If you had a lower endoscopy (such as a colonoscopy or flexible sigmoidoscopy) you may notice spotting of blood in your stool or on the toilet paper. If you underwent a bowel prep for your procedure, you may not have a normal bowel movement for a few days.  Please Note:  You might notice some irritation and congestion in your nose or some drainage.  This is from the oxygen used during your procedure.  There is no need for concern and it should clear up in a day or so.  SYMPTOMS TO REPORT IMMEDIATELY:   Following lower endoscopy (colonoscopy or flexible sigmoidoscopy):  Excessive amounts of blood in the stool  Significant tenderness or worsening of abdominal pains  Swelling of the abdomen that is new, acute  Fever of 100F or higher   Following upper endoscopy (EGD)  Vomiting of blood or coffee ground material  New chest pain or pain under the shoulder blades  Painful or persistently difficult swallowing  New shortness of breath  Fever of 100F or higher  Black, tarry-looking stools   Please see handouts given to you on Diverticulosis.  For urgent or emergent issues, a gastroenterologist can be reached at any hour by calling [phone].   DIET:  We do recommend a small meal at  first, but then you may proceed to your regular diet.  Drink plenty of fluids but you should avoid alcoholic beverages for 24 hours.  ACTIVITY:  You should plan to take it easy for the rest of today and you should NOT DRIVE or use heavy machinery until tomorrow (because of the sedation medicines used during the test).    FOLLOW UP: Our staff will call the number listed on your records the next business day following your procedure to check on you and address any questions or concerns that you may have regarding the information given to you following your procedure. If we do not reach you, we will leave a message.  However, if you are feeling well and you are not experiencing any problems, there is no need to return our call.  We will assume that you have returned to your regular daily activities without incident.  If any biopsies were taken you will be contacted by phone or by letter within the next 1-3 weeks.  Please call us at [phone] if you have not heard about the biopsies in 3 weeks.    SIGNATURES/CONFIDENTIALITY: You and/or your care partner have signed paperwork which will be entered into your electronic medical record.  These signatures attest to the fact that that the information above on your After Visit Summary has been reviewed and is understood.  Full responsibility of the confidentiality of this discharge information lies with you and/or your care-partner.  Thank  you for letting us take care of your healthcare needs today. 

## 2017-10-09 NOTE — Progress Notes (Signed)
Report given to PACU, vss 

## 2017-10-09 NOTE — Op Note (Signed)
[facility] Patient Name: Jasmine [name] Procedure Date: 10/09/2017 3:26 PM MRN: [id number] Endoscopist: [place]. Loletha Carrow , MD Age: 62 Referring MD:  Date of Birth: 09-09-1955 Gender: Female Account #: [id number] Procedure:                Upper GI endoscopy Indications:              Iron deficiency anemia secondary to chronic blood                            loss, Heme positive stool Medicines:                Monitored Anesthesia Care Procedure:                Pre-Anesthesia Assessment:                           - Prior to the procedure, a History and Physical                            was performed, and patient medications and                            allergies were reviewed. The patient's tolerance of                            previous anesthesia was also reviewed. The risks                            and benefits of the procedure and the sedation                            options and risks were discussed with the patient.                            All questions were answered, and informed consent                            was obtained. Prior Anticoagulants: The patient has                            taken no previous anticoagulant or antiplatelet                            agents. ASA Grade Assessment: II - A patient with                            mild systemic disease. After reviewing the risks                            and benefits, the patient was deemed in                            satisfactory condition to undergo the procedure.  After obtaining informed consent, the endoscope was                            passed under direct vision. Throughout the                            procedure, the patient's blood pressure, pulse, and                            oxygen saturations were monitored continuously. The                            Model GIF-[id number]) scope was introduced                            through the mouth, and advanced  to the second part                            of duodenum. The patient tolerated the procedure                            well. Scope In: Scope Out: Findings:                 The esophagus was normal.                           Nodular mucosa was found at the gastroesophageal                            junction. Biopsies were taken with a cold forceps                            for histology.                           The exam of the stomach was otherwise normal.                           The cardia and gastric fundus were normal on                            retroflexion.                           The examined duodenum was normal. Six biopsies for                            histology were taken with a cold forceps for                            evaluation of celiac disease. Complications:            No immediate complications. Estimated Blood Loss:     Estimated blood loss was minimal. Impression:               -  Normal esophagus.                           - Nodular mucosa in the gastroesophageal junction.                            Biopsied.                           - Normal examined duodenum. Biopsied. Recommendation:           - Patient has a contact number available for                            emergencies. The signs and symptoms of potential                            delayed complications were discussed with the                            patient. Return to normal activities tomorrow.                            Written discharge instructions were provided to the                            patient.                           - Resume previous diet.                           - Continue present medications.                           - Await pathology results.                           - See the other procedure note (colonoscopy) for                            documentation of additional recommendations.  L. Loletha Carrow, MD 10/09/2017 4:17:59 PM This report has been signed  electronically.

## 2017-10-09 NOTE — Progress Notes (Signed)
Called to room to assist during endoscopic procedure.  Patient ID and intended procedure confirmed with present staff. Received instructions for my participation in the procedure from the performing physician.  

## 2017-10-09 NOTE — Op Note (Signed)
[facility] Patient Name: Jasmine [name] Procedure Date: 10/09/2017 3:25 PM MRN: [id number] Endoscopist: [place]. Loletha Carrow , MD Age: 62 Referring MD:  Date of Birth: 04-12-1956 Gender: Female Account #: [id number] Procedure:                Colonoscopy Indications:              Heme positive stool, Iron deficiency anemia                            secondary to chronic blood loss Medicines:                Monitored Anesthesia Care Procedure:                Pre-Anesthesia Assessment:                           - Prior to the procedure, a History and Physical                            was performed, and patient medications and                            allergies were reviewed. The patient's tolerance of                            previous anesthesia was also reviewed. The risks                            and benefits of the procedure and the sedation                            options and risks were discussed with the patient.                            All questions were answered, and informed consent                            was obtained. Prior Anticoagulants: The patient has                            taken no previous anticoagulant or antiplatelet                            agents. ASA Grade Assessment: II - A patient with                            mild systemic disease. After reviewing the risks                            and benefits, the patient was deemed in                            satisfactory condition to undergo the procedure.  After obtaining informed consent, the colonoscope                            was passed under direct vision. Throughout the                            procedure, the patient's blood pressure, pulse, and                            oxygen saturations were monitored continuously. The                            Colonoscope was introduced through the anus and                            advanced to the the cecum, identified  by                            appendiceal orifice and ileocecal valve. The                            colonoscopy was performed with difficulty due to                            multiple diverticula in the colon, significant                            looping and a tortuous colon. Successful completion                            of the procedure was aided by using manual                            pressure. The patient tolerated the procedure well.                            The quality of the bowel preparation was good after                            lavage, except for some of the proximal ascending                            colon that could not be completely cleared. The                            ileocecal valve, appendiceal orifice, and rectum                            were photographed. Scope In: 3:41:05 PM Scope Out: 4:08:04 PM Scope Withdrawal Time: 0 hours 16 minutes 4 seconds  Total Procedure Duration: 0 hours 26 minutes 59 seconds  Findings:                 The digital rectal exam findings include decreased  sphincter tone.                           Many diverticula were found in the left colon.                           The left colon was significantly tortuous.                           The colon was redundant, causing scope looping.                           The exam was otherwise without abnormality on                            direct and retroflexion views. Complications:            No immediate complications. Estimated Blood Loss:     Estimated blood loss: none. Impression:               - Decreased sphincter tone found on digital rectal                            exam.                           - Diverticulosis in the left colon.                           - Tortuous colon.                           - Redundant colon.                           - The examination was otherwise normal on direct                            and retroflexion  views.                           - No specimens collected. Recommendation:           - Patient has a contact number available for                            emergencies. The signs and symptoms of potential                            delayed complications were discussed with the                            patient. Return to normal activities tomorrow.                            Written discharge instructions were provided to the  patient.                           - Resume previous diet.                           - Continue present medications.                           - Repeat colonoscopy in 7 years for screening                            purposes due to suboptimal right colon prep.                           - To visualize the small bowel, perform video                            capsule endoscopy at appointment to be scheduled. Henry L. Loletha Carrow, MD 10/09/2017 4:24:37 PM This report has been signed electronically.

## 2017-10-10 ENCOUNTER — Telehealth: Payer: Self-pay | Admitting: *Deleted

## 2017-10-10 ENCOUNTER — Other Ambulatory Visit: Payer: Self-pay

## 2017-10-10 ENCOUNTER — Telehealth: Payer: Self-pay | Admitting: Gastroenterology

## 2017-10-10 DIAGNOSIS — D509 Iron deficiency anemia, unspecified: Secondary | ICD-10-CM

## 2017-10-10 DIAGNOSIS — R195 Other fecal abnormalities: Secondary | ICD-10-CM

## 2017-10-10 NOTE — Telephone Encounter (Signed)
  Follow up Call-  Call back number 10/09/2017  Post procedure Call Back phone  # [phone]  Permission to leave phone message Yes  Some recent data might be hidden     Patient questions:  Do you have a fever, pain , or abdominal swelling? No. Pain Score  0 *  Have you tolerated food without any problems? Yes.    Have you been able to return to your normal activities? Yes.    Do you have any questions about your discharge instructions: Diet   No. Medications  No. Follow up visit  No.  Do you have questions or concerns about your Care? No.  Actions: * If pain score is 4 or above: No action needed, pain <4.

## 2017-10-10 NOTE — Telephone Encounter (Signed)
Please schedule a small bowel video capsule study for this patient. Dx: heme positive stool, iron deficiency anemia  Also, needs a CBC and iron studies this week or next

## 2017-10-10 NOTE — Telephone Encounter (Signed)
Patient scheduled for endo capsule study at [facility] on 6/24 at 0800. Mailed patient prep instructions, verbally went over them. Patient also aware to come get labs done.

## 2017-10-10 NOTE — Telephone Encounter (Signed)
Left message for patient to call back, need to schedule some tests.

## 2017-10-11 ENCOUNTER — Telehealth: Payer: Self-pay

## 2017-10-11 NOTE — Telephone Encounter (Signed)
Copied from [place] [id number]. Topic: General - Other >> Oct 11, 2017 11:04 AM Boyd Kerbs wrote: Reason for CRM:   pt. Calling Chrissy, Marya Landry, [facility] told her [facility] was trying to get ahold of pt.  Please call back

## 2017-10-11 NOTE — Telephone Encounter (Signed)
No one from our office called her. GI provider is not at our office.

## 2017-10-15 ENCOUNTER — Encounter: Payer: 59 | Admitting: Gastroenterology

## 2017-10-17 DIAGNOSIS — Z1231 Encounter for screening mammogram for malignant neoplasm of breast: Secondary | ICD-10-CM | POA: Diagnosis not present

## 2017-10-21 ENCOUNTER — Encounter (HOSPITAL_COMMUNITY)
Admission: RE | Disposition: A | Discharge status: Home / Self Care | Payer: Self-pay | Source: Ambulatory Visit | Attending: Gastroenterology

## 2017-10-21 ENCOUNTER — Encounter: Payer: Self-pay | Admitting: Gastroenterology

## 2017-10-21 ENCOUNTER — Ambulatory Visit (HOSPITAL_COMMUNITY)
Admission: RE | Admit: 2017-10-21 | Discharge: 2017-10-21 | Disposition: A | Discharge status: Home / Self Care | Payer: 59 | Source: Ambulatory Visit | Attending: Gastroenterology | Admitting: Gastroenterology

## 2017-10-21 DIAGNOSIS — D509 Iron deficiency anemia, unspecified: Secondary | ICD-10-CM | POA: Insufficient documentation

## 2017-10-21 DIAGNOSIS — K289 Gastrojejunal ulcer, unspecified as acute or chronic, without hemorrhage or perforation: Secondary | ICD-10-CM | POA: Insufficient documentation

## 2017-10-21 DIAGNOSIS — R195 Other fecal abnormalities: Secondary | ICD-10-CM | POA: Diagnosis not present

## 2017-10-21 HISTORY — PX: GIVENS CAPSULE STUDY: SHX5432

## 2017-10-21 SURGERY — IMAGING PROCEDURE, GI TRACT, INTRALUMINAL, VIA CAPSULE
Anesthesia: LOCAL

## 2017-10-21 SURGICAL SUPPLY — 1 items: TOWEL COTTON PACK 4EA (MISCELLANEOUS) ×4 IMPLANT

## 2017-10-21 NOTE — Progress Notes (Signed)
Capsule study done this morning at approximately 0820. Verbal and written instructions given to patient. Patient verbalized understanding and will return capsule belt and recorder 10/22/17 between 8-9a. BRT, RN

## 2017-10-23 ENCOUNTER — Encounter (HOSPITAL_COMMUNITY): Payer: Self-pay | Admitting: Gastroenterology

## 2017-10-24 DIAGNOSIS — N6311 Unspecified lump in the right breast, upper outer quadrant: Secondary | ICD-10-CM | POA: Diagnosis not present

## 2017-10-24 DIAGNOSIS — N6489 Other specified disorders of breast: Secondary | ICD-10-CM | POA: Diagnosis not present

## 2017-10-28 ENCOUNTER — Telehealth: Payer: Self-pay | Admitting: Gastroenterology

## 2017-10-28 NOTE — Telephone Encounter (Signed)
Let patient know that it will typically be 3 weeks before can get the results for her.

## 2017-10-30 ENCOUNTER — Ambulatory Visit: Payer: 59 | Admitting: Family Medicine

## 2017-11-04 ENCOUNTER — Telehealth: Payer: Self-pay | Admitting: Gastroenterology

## 2017-11-04 NOTE — Telephone Encounter (Signed)
I spoke with the patient and gave her the results.

## 2017-11-04 NOTE — Telephone Encounter (Signed)
Apologies for the delay reviewing the video capsule study. Good news, it does not show anything worrisome.  There is a tiny, benign-appearing ulcer in the small intestine.  I suspect that is the source of occult GI blood loss leading to iron deficiency anemia.  This is a common finding on capsule study.  They typically occur from regular use of aspirin/NSAIDs, though not necessarily.  If she is taking those meds, she should avoid them as much as possible.  I do not think this small ulcer is likely to worsen.  Fortunately, lab work in March showed that her hemoglobin has returned to normal and iron levels were coming up from what they have been in February.  I recommend that her CBC and iron levels be checked at her upcoming appointment with a new primary care provider.  I will copy them on this so they are aware.  If iron levels have normalized and blood counts remain normal, I believe the iron can be stopped and the lab work monitored periodically by primary care,  and Jasmine [name] referred back to [name] as needed.

## 2017-11-09 NOTE — Progress Notes (Addendum)
[facility] [address] [place], [place] [address] [phone]  Date:  11/13/2017   Name:  Jasmine [name]   DOB:  08-14-1955   MRN:  [id number]  PCP:  Jasmine Mclean, MD    Chief Complaint: New Patient (Initial Visit) (transfer from Jasmine [name], bleeding ulcer, low iron-taking supplement-needing lab work)   History of Present Illness:  Jasmine [name] is a 62 y.o. very pleasant female patient who presents with the following:  New patient here today although I did see her at [place] in 2014.  History of hyperlipidemia, OSA, GERD, obesity  She is changing to my practice from Jasmine [name] as this location is good for her and she noted communication issues with the office  Nausea without vomiting Possible causes dicussed. ? GERD,dyspepsia,anxiety among some. Further recommendations will be given according to lab results -     Comprehensive metabolic panel GERD (gastroesophageal reflux disease) Nausea she is reporting could be related to GERD. I recommend increasing dose of Omeprazole from 20 mg to 40 mg and monitor for changes. She follows with GI regularly. Instructed about warning signs. GERD precautions also recommended  Elevated transaminase level Possible etiologies discussed. Further recommendations will be given according to lab results. Abdomen US 18 03/2017 did not show fatty liver or other abnormality. This is not a new problem so I am assuming she already had hepatitis screening.  Before I order further workup I will review prior labs. We will continue following.  Anemia, unspecified Further recommendations will be given according to lab results. Adjustment disorder with mixed anxiety and depressed mood Reported as well controlled with current management. Xanax could aggravate abnormal LFT's as well as Imipramine. She will continue following with Jasmine [name]. Hyperlipidemia For now I recommend  nonpharmacologic treatment with low fat diet and regular exercise. Further recommendation will be given according to lab results. Obesity with body mass index (BMI) of 30.0 to 39.9 We discussed benefits of wt loss as well as adverse effects of obesity. Consistency with healthy diet and physical activity recommended. Daily brisk walking for 15-30 min as tolerated. OSA (obstructive sleep apnea) We discussed physiopathology of OSA and adverse effects when is not treated appropriately. For now she is not interested in repeating sleep study, she tells me that she will let me know if she decides to do so. Weight loss might also help.  She thinks she is up-to-date with all her vaccines, including Tdap.  She has a new grandson, strongly recommend being sure she is up-to-date with vaccination. Rx for  Zoster Vaccine given: Adjuvanted [facility]) injection; 0.5 ml in muscle and repeat in 8 weeks Influenza vaccine up-to-date. Colonoscopy 03/2011. She will continue following with gynecologist for her female preventive care.  Pt reports that Dr. Loletha [name] wanted her to have her iron monitored periodically Per his note from 7/8-  Apologies for the delay reviewing the video capsule study. Good news, it does not show anything worrisome.  There is a tiny, benign-appearing ulcer in the small intestine.  I suspect that is the source of occult GI blood loss leading to iron deficiency anemia.  This is a common finding on capsule study.  They typically occur from regular use of aspirin/NSAIDs, though not necessarily.  If she is taking those meds, she should avoid them as much as possible.  I do not think this small ulcer is likely to worsen.  Fortunately, lab work in March showed  that her hemoglobin has returned to normal and iron levels were coming up from what they have been in February. I recommend that her CBC and iron levels be checked at her upcoming appointment with a new primary care provider.  I will copy  them on this so they are aware.  If iron levels have normalized and blood counts remain normal, I believe the iron can be stopped and the lab work monitored periodically by primary care,  and Jasmine [name] referred back to [name] as needed.  She had an UGI and colon, small gastric ulcer discovered in June per her GI-  She is taking iron BID curently  Lab Results  Component Value Date   FERRITIN 41.6 07/12/2017   She is due for Tdap  She did not have the shingles vaccine- she had an rx but never got the shot.  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  Daughter of her daughter Jasmine [name] and her husband   Her GYN is Dr. Edwinna [name] who she sees on a regular basis  Patient Active Problem List   Diagnosis Date Noted  . Elevated transaminase level 06/05/2017  . Hyperlipidemia 06/05/2017  . Anemia, unspecified 06/05/2017  . GERD (gastroesophageal reflux disease) 06/05/2017  . OSA (obstructive sleep apnea) 06/05/2017  . Obesity with body mass index (BMI) of 30.0 to 39.9 06/05/2017  . Urinary incontinence 09/18/2012  . Diverticulitis of colon 07/30/2012  . Adjustment disorder with mixed anxiety and depressed mood 07/28/2012    Past Medical History:  Diagnosis Date  . Anemia   . Anxiety   . Arthritis   . Depression   . Diverticulitis   . Diverticulosis   . GERD (gastroesophageal reflux disease)    tums only PRN  . Ovarian cyst   . Prediabetes   . Sleep apnea    no CPAP    Past Surgical History:  Procedure Laterality Date  . ABDOMINAL HYSTERECTOMY     modified, tubes and ovaries- still has uterus  . BLADDER SUSPENSION N/A 04/24/2013   Procedure: TRANSVAGINAL TAPE (TVT) Exact PROCEDURE;  Surgeon: Jasmine Royals, MD;  Location: [facility];  Service: Gynecology;  Laterality: N/A;  . CARPAL TUNNEL RELEASE Bilateral 1997  .  COLONOSCOPY    . CYSTOSCOPY N/A 04/24/2013   Procedure: CYSTOSCOPY;  Surgeon: Jasmine Royals, MD;  Location: [facility];  Service: Gynecology;  Laterality: N/A;  . GIVENS CAPSULE STUDY N/A 10/21/2017   Procedure: GIVENS CAPSULE STUDY;  Surgeon: Doran Stabler, MD;  Location: [facility];  Service: Gastroenterology;  Laterality: N/A;  . LAPAROSCOPY Bilateral 05/03/2014   Procedure: LAPAROSCOPIC BILATERAL SALPINGO OOPHORECTOMY;  Surgeon: Lyman Speller, MD;  Location: [facility];  Service: Gynecology;  Laterality: Bilateral;  . TOTAL KNEE ARTHROPLASTY Bilateral 04/2010    Social History   Tobacco Use  . Smoking status: Former Smoker    Packs/day: 0.50    Types: Cigarettes    Last attempt to quit: 10/28/2016    Years since quitting: 1.0  . Smokeless tobacco: Never Used  . Tobacco comment: 1/2 PPD  Substance Use Topics  . Alcohol use: Yes    Alcohol/week: 0.0 oz    Comment: 1 x per month  . Drug use: No    Family History  Problem Relation Age of  Onset  . Multiple myeloma Mother   . Early death Mother   . Heart attack Father   . Alcohol abuse Father   . COPD Father   . Cancer Maternal Grandmother   . Alcohol abuse Maternal Grandfather   . Depression Maternal Grandfather   . Heart disease Paternal Grandmother   . Colon cancer Neg Hx   . Stomach cancer Neg Hx   . Esophageal cancer Neg Hx   . Rectal cancer Neg Hx     No Known Allergies  Medication list has been reviewed and updated.  Current Outpatient Medications on File Prior to Visit  Medication Sig Dispense Refill  . ALPRAZolam (XANAX) 0.5 MG tablet Take 1 tablet by mouth daily.    Marland Kitchen amoxicillin (AMOXIL) 500 MG capsule Take 1 capsule (500 mg total) by mouth 3 (three) times daily. 30 capsule 0  . calcium carbonate (TUMS - DOSED IN MG ELEMENTAL CALCIUM) 500 MG chewable tablet Chew 2 tablets by mouth as needed for indigestion or heartburn.    . Calcium-Magnesium-Vitamin D (CALCIUM 1200+D3 PO) Take 1 tablet by mouth 2 (two)  times daily.    Marland Kitchen docusate sodium (COLACE) 100 MG capsule Take 100 mg by mouth daily.    . ferrous sulfate (FEROSUL) 325 (65 FE) MG tablet Take 650 mg by mouth daily with breakfast.     . FIBER FORMULA CAPS Take 1 capsule by mouth 2 (two) times daily.     . fish oil-omega-3 fatty acids 1000 MG capsule Take 1 g by mouth daily.     Marland Kitchen imipramine (TOFRANIL) 50 MG tablet Take 50 mg by mouth Daily.    Marland Kitchen PARoxetine (PAXIL) 40 MG tablet Take 40 mg by mouth Daily.    . ranitidine (ZANTAC) 150 MG tablet Take 1 tablet by mouth before dinner. (Patient taking differently: 150 mg 2 (two) times daily. Take 2 tablet by mouth before dinner.) 270 tablet 2  . Zoster Vaccine Adjuvanted [facility]) injection 0.5 ml in muscle and repeat in 8 weeks 0.5 mL 1   Current Facility-Administered Medications on File Prior to Visit  Medication Dose Route Frequency Provider Last Rate Last Dose  . 0.9 %  sodium chloride infusion  500 mL Intravenous Once Doran Stabler, MD        Review of Systems:  As per HPI- otherwise negative. No fever or chills In good spirits    Physical Examination: Vitals:   11/13/17 1023  BP: (!) 134/96  Pulse: (!) 105  Resp: 16  SpO2: 96%   Vitals:   11/13/17 1023  Weight: 239 lb (108.4 kg)  Height: '5\' 6"'  (1.676 m)   Body mass index is 38.58 kg/m. Ideal Body Weight: Weight in (lb) to have BMI = 25: 154.6  GEN: WDWN, NAD, Non-toxic, A & O x 3, looks well, obese  HEENT: Atraumatic, Normocephalic. Neck supple. No masses, No LAD.  Bilateral TM wnl, oropharynx normal.  PEERL,EOMI.   Ears and Nose: No external deformity. CV: RRR, No M/G/R. No JVD. No thrill. No extra heart sounds. PULM: CTA B, no wheezes, crackles, rhonchi. No retractions. No resp. distress. No accessory muscle use. EXTR: No c/c/e NEURO Normal gait.  PSYCH: Normally interactive. Conversant. Not depressed or anxious appearing.  Calm demeanor.    Assessment and Plan: Iron deficiency anemia, unspecified iron  deficiency anemia type - Plan: CBC, Ferritin  Immunization due - Plan: Varicella-zoster vaccine IM (Shingrix), Tdap vaccine greater than or equal to 7yo IM  Gastroesophageal reflux  disease, esophagitis presence not specified - Plan: Comprehensive metabolic panel  Hyperlipidemia, unspecified hyperlipidemia type  Establishing care with our office today tdap and shingrix #1 today Will check on her iron levels She is seeing GYN in November Asked her to see me in feb or march of 2020  Signed Lamar Blinks, MD  Received her labs, letter to pt - ok to stop iron now   Results for orders placed or performed in visit on 11/13/17  CBC  Result Value Ref Range   WBC 5.8 4.0 - 10.5 K/uL   RBC 4.79 3.87 - 5.11 Mil/uL   Platelets 259.0 150.0 - 400.0 K/uL   Hemoglobin 14.2 12.0 - 15.0 g/dL   HCT 42.8 36.0 - 46.0 %   MCV 89.3 78.0 - 100.0 fl   MCHC 33.1 30.0 - 36.0 g/dL   RDW 14.7 11.5 - 15.5 %  Comprehensive metabolic panel  Result Value Ref Range   Sodium 138 135 - 145 mEq/L   Potassium 4.3 3.5 - 5.1 mEq/L   Chloride 101 96 - 112 mEq/L   CO2 30 19 - 32 mEq/L   Glucose, Bld 105 (H) 70 - 99 mg/dL   BUN 14 6 - 23 mg/dL   Creatinine, Ser 0.99 0.40 - 1.20 mg/dL   Total Bilirubin 0.4 0.2 - 1.2 mg/dL   Alkaline Phosphatase 109 39 - 117 U/L   AST 19 0 - 37 U/L   ALT 23 0 - 35 U/L   Total Protein 6.8 6.0 - 8.3 g/dL   Albumin 4.0 3.5 - 5.2 g/dL   Calcium 9.7 8.4 - 10.5 mg/dL   GFR 60.22 >60.00 mL/min  Ferritin  Result Value Ref Range   Ferritin 75.2 10.0 - 291.0 ng/mL

## 2017-11-13 ENCOUNTER — Encounter: Payer: Self-pay | Admitting: Family Medicine

## 2017-11-13 ENCOUNTER — Ambulatory Visit (INDEPENDENT_AMBULATORY_CARE_PROVIDER_SITE_OTHER): Payer: 59 | Admitting: Family Medicine

## 2017-11-13 ENCOUNTER — Encounter

## 2017-11-13 VITALS — BP 128/90 | HR 96 | Resp 16 | Ht 66.0 in | Wt 239.0 lb

## 2017-11-13 DIAGNOSIS — E785 Hyperlipidemia, unspecified: Secondary | ICD-10-CM | POA: Diagnosis not present

## 2017-11-13 DIAGNOSIS — K219 Gastro-esophageal reflux disease without esophagitis: Secondary | ICD-10-CM | POA: Diagnosis not present

## 2017-11-13 DIAGNOSIS — D509 Iron deficiency anemia, unspecified: Secondary | ICD-10-CM

## 2017-11-13 DIAGNOSIS — Z23 Encounter for immunization: Secondary | ICD-10-CM | POA: Diagnosis not present

## 2017-11-13 DIAGNOSIS — Z7689 Persons encountering health services in other specified circumstances: Secondary | ICD-10-CM | POA: Diagnosis not present

## 2017-11-13 LAB — CBC
HCT: 42.8 % (ref 36.0–46.0)
HEMOGLOBIN: 14.2 g/dL (ref 12.0–15.0)
MCHC: 33.1 g/dL (ref 30.0–36.0)
MCV: 89.3 fl (ref 78.0–100.0)
PLATELETS: 259 10*3/uL (ref 150.0–400.0)
RBC: 4.79 Mil/uL (ref 3.87–5.11)
RDW: 14.7 % (ref 11.5–15.5)
WBC: 5.8 10*3/uL (ref 4.0–10.5)

## 2017-11-13 LAB — COMPREHENSIVE METABOLIC PANEL
ALT: 23 U/L (ref 0–35)
AST: 19 U/L (ref 0–37)
Albumin: 4 g/dL (ref 3.5–5.2)
Alkaline Phosphatase: 109 U/L (ref 39–117)
BILIRUBIN TOTAL: 0.4 mg/dL (ref 0.2–1.2)
BUN: 14 mg/dL (ref 6–23)
CALCIUM: 9.7 mg/dL (ref 8.4–10.5)
CO2: 30 meq/L (ref 19–32)
CREATININE: 0.99 mg/dL (ref 0.40–1.20)
Chloride: 101 mEq/L (ref 96–112)
GFR: 60.22 mL/min (ref >60.00)
GLUCOSE: 105 mg/dL — AB (ref 70–99)
Potassium: 4.3 mEq/L (ref 3.5–5.1)
Sodium: 138 mEq/L (ref 135–145)
Total Protein: 6.8 g/dL (ref 6.0–8.3)

## 2017-11-13 LAB — FERRITIN: Ferritin: 75.2 ng/mL (ref 10.0–291.0)

## 2017-11-13 NOTE — Patient Instructions (Addendum)
It was really nice to see you again today-  I will be in touch with your labs asap, we will check on your iron level You got your tetanus vaccine and your first shingles vaccine today- 2nd shot in 2-6 months please   If your iron is adequate we can decrease or stop your iron pills

## 2017-11-20 ENCOUNTER — Encounter: Payer: Self-pay | Admitting: Obstetrics & Gynecology

## 2017-12-03 ENCOUNTER — Telehealth: Payer: Self-pay | Admitting: Obstetrics & Gynecology

## 2017-12-03 NOTE — Telephone Encounter (Signed)
Spoke with patient. She c/o one month of vaginal odor and vaginal itching. No dysuria. Feels urine is more concentrated but drinking fluids like normal. Recent procedures in the last month for evaluation by GI, endoscopy and colonoscopy. Feels maybe she has had some irritation from those procedures. No dysuria, no fevers, no flank pain. Hx bladder sling.  Going out of town on Saturday and felt like "she should get it checked out."  Office visit with Dr. Sabra Heck scheduled for Thursday at 1530. Pt is advised to call back or seek immediate care if develops blood in urine, back pain, fevers or chills, nausea and or vomiting. Pt verbalized understanding and is agreeable to plan.  Encounter closed.

## 2017-12-03 NOTE — Telephone Encounter (Signed)
Patient left a message asking to be seen for a uti  To triage to assist with scheduling with Dr.[name].

## 2017-12-05 ENCOUNTER — Other Ambulatory Visit: Payer: Self-pay

## 2017-12-05 ENCOUNTER — Ambulatory Visit (INDEPENDENT_AMBULATORY_CARE_PROVIDER_SITE_OTHER): Payer: 59 | Admitting: Obstetrics & Gynecology

## 2017-12-05 ENCOUNTER — Encounter: Payer: Self-pay | Admitting: Obstetrics & Gynecology

## 2017-12-05 VITALS — BP 122/80 | HR 100 | Resp 18 | Ht 66.0 in | Wt 238.6 lb

## 2017-12-05 DIAGNOSIS — R3915 Urgency of urination: Secondary | ICD-10-CM | POA: Diagnosis not present

## 2017-12-05 DIAGNOSIS — N898 Other specified noninflammatory disorders of vagina: Secondary | ICD-10-CM

## 2017-12-05 DIAGNOSIS — R8290 Unspecified abnormal findings in urine: Secondary | ICD-10-CM | POA: Diagnosis not present

## 2017-12-05 DIAGNOSIS — R3129 Other microscopic hematuria: Secondary | ICD-10-CM

## 2017-12-05 LAB — POCT URINALYSIS DIPSTICK
Bilirubin, UA: NEGATIVE
GLUCOSE UA: NEGATIVE
Ketones, UA: NEGATIVE
NITRITE UA: NEGATIVE
PROTEIN UA: NEGATIVE
Urobilinogen, UA: 0.2 E.U./dL
pH, UA: 5 (ref 5.0–8.0)

## 2017-12-05 MED ORDER — SULFAMETHOXAZOLE-TRIMETHOPRIM 800-160 MG PO TABS: 1.0000 | ORAL_TABLET | Freq: Two times a day (BID) | ORAL | 0 refills | Status: DC

## 2017-12-05 NOTE — Progress Notes (Signed)
GYNECOLOGY  VISIT  CC:   Vaginal irritation and urinary urgency  HPI: 62 y.o. [id number] [name] here for itching, urgency and urine odor x 3 weeks.  Urinary urgency seems to be worse the past few days.  Has been unable to void since she got here today.  Feels this has been an ongoing issue for a few months.  Wonders if she should see Dr. Benjie [name] again--provider who placed her sling. Feels urine odor is abnormal as well.  Also reports some vaginal odor and some vaginal irritation over the past several days.  No new sexual partners.    Had endoscopy and small bowel evaluation and feels some of these symptoms occurred after these procedures.  Not completely sure they are related but wonders.  Did have a non-bleeding small bowel ulcer that did not require treatment.  Pt is going to the beach with family for a week and leaving on Saturday AM.  GYNECOLOGIC HISTORY: Patient's last menstrual period was 01/28/2010. Contraception: hysterectomy  Menopausal hormone therapy: none  Patient Active Problem List   Diagnosis Date Noted  . Elevated transaminase level 06/05/2017  . Hyperlipidemia 06/05/2017  . Anemia, unspecified 06/05/2017  . GERD (gastroesophageal reflux disease) 06/05/2017  . OSA (obstructive sleep apnea) 06/05/2017  . Obesity with body mass index (BMI) of 30.0 to 39.9 06/05/2017  . Urinary incontinence 09/18/2012  . Diverticulitis of colon 07/30/2012  . Adjustment disorder with mixed anxiety and depressed mood 07/28/2012    Past Medical History:  Diagnosis Date  . Anemia   . Anxiety   . Arthritis   . Depression   . Diverticulitis   . Diverticulosis   . GERD (gastroesophageal reflux disease)    tums only PRN  . Ovarian cyst   . Prediabetes   . Sleep apnea    no CPAP    Past Surgical History:  Procedure Laterality Date  . ABDOMINAL HYSTERECTOMY     modified, tubes and ovaries- still has uterus  . BLADDER SUSPENSION N/A 04/24/2013   Procedure:  TRANSVAGINAL TAPE (TVT) Exact PROCEDURE;  Surgeon: Jasmine Royals, MD;  Location: [facility];  Service: Gynecology;  Laterality: N/A;  . CARPAL TUNNEL RELEASE Bilateral 1997  . COLONOSCOPY    . CYSTOSCOPY N/A 04/24/2013   Procedure: CYSTOSCOPY;  Surgeon: Jasmine Royals, MD;  Location: [facility];  Service: Gynecology;  Laterality: N/A;  . GIVENS CAPSULE STUDY N/A 10/21/2017   Procedure: GIVENS CAPSULE STUDY;  Surgeon: Jasmine Stabler, MD;  Location: [facility];  Service: Gastroenterology;  Laterality: N/A;  . LAPAROSCOPY Bilateral 05/03/2014   Procedure: LAPAROSCOPIC BILATERAL SALPINGO OOPHORECTOMY;  Surgeon: Jasmine Speller, MD;  Location: [facility];  Service: Gynecology;  Laterality: Bilateral;  . TOTAL KNEE ARTHROPLASTY Bilateral 04/2010    MEDS:   Current Outpatient Medications on File Prior to Visit  Medication Sig Dispense Refill  . ALPRAZolam (XANAX) 0.5 MG tablet Take 1 tablet by mouth daily.    . calcium carbonate (TUMS - DOSED IN MG ELEMENTAL CALCIUM) 500 MG chewable tablet Chew 2 tablets by mouth as needed for indigestion or heartburn.    . Calcium-Magnesium-Vitamin D (CALCIUM 1200+D3 PO) Take 1 tablet by mouth 2 (two) times daily.    Marland Kitchen docusate sodium (COLACE) 100 MG capsule Take 100 mg by mouth daily.    . ferrous sulfate (FEROSUL) 325 (65 FE) MG tablet Take 650 mg by mouth daily with breakfast.     . FIBER FORMULA CAPS Take 1 capsule by mouth  2 (two) times daily.     . fish oil-omega-3 fatty acids 1000 MG capsule Take 1 g by mouth daily.     Marland Kitchen imipramine (TOFRANIL) 50 MG tablet Take 50 mg by mouth Daily.    Marland Kitchen PARoxetine (PAXIL) 40 MG tablet Take 40 mg by mouth Daily.    . ranitidine (ZANTAC) 150 MG tablet Take 1 tablet by mouth before dinner. (Patient taking differently: 150 mg 2 (two) times daily. Take 2 tablet by mouth before dinner.) 270 tablet 2   No current facility-administered medications on file prior to visit.     ALLERGIES: Patient has no known allergies.  Family  History  Problem Relation Age of Onset  . Multiple myeloma Mother   . Early death Mother   . Heart attack Father   . Alcohol abuse Father   . COPD Father   . Cancer Maternal Grandmother   . Alcohol abuse Maternal Grandfather   . Depression Maternal Grandfather   . Heart disease Paternal Grandmother   . Colon cancer Neg Hx   . Stomach cancer Neg Hx   . Esophageal cancer Neg Hx   . Rectal cancer Neg Hx     SH:  Married, h/o occasional smoking quit 7/18  Review of Systems  All other systems reviewed and are negative.   PHYSICAL EXAMINATION:    BP 122/80 (BP Location: Right Arm, Patient Position: Sitting, Cuff Size: Large)   Pulse 100   Resp 18   Ht '5\' 6"'  (1.676 m)   Wt 238 lb 9.6 oz (108.2 kg)   LMP 01/28/2010   BMI 38.51 kg/m     General appearance: alert, cooperative and appears stated age Lymph:  no inguinal LAD noted  Pelvic: External genitalia:  no lesions              Urethra:  normal appearing urethra with no masses, tenderness or lesions              Bartholins and Skenes: normal                 Vagina: normal appearing vagina with normal color, whitish discharge present, no visible lesions noted              Cervix: no lesions              Bimanual Exam:  Uterus:  normal size, contour, position, consistency, mobility, non-tender              Adnexa: no mass, fullness, tenderness  Chaperone was present for exam.  Assessment: Urinary urgency  Vaginal discharge and irritation  Plan: Affirm obtained Urine culture pending Bactrim DS bid x 3 days started as dip u/a is abnormal.

## 2017-12-06 ENCOUNTER — Telehealth: Payer: Self-pay | Admitting: *Deleted

## 2017-12-06 LAB — URINE CULTURE

## 2017-12-06 LAB — VAGINITIS/VAGINOSIS, DNA PROBE
CANDIDA SPECIES: NEGATIVE
GARDNERELLA VAGINALIS: POSITIVE — AB
TRICHOMONAS VAG: NEGATIVE

## 2017-12-06 MED ORDER — METRONIDAZOLE 500 MG PO TABS: 500.0000 mg | ORAL_TABLET | Freq: Two times a day (BID) | ORAL | 0 refills | Status: DC

## 2017-12-06 NOTE — Telephone Encounter (Signed)
Patient returning call to Reina. °

## 2017-12-06 NOTE — Telephone Encounter (Signed)
-----   Message from Megan Salon, MD sent at 12/06/2017  1:03 PM EDT ----- Please let her know that her vaginitis testing showed BV.  Ok to treat with Metrogel 7.00%, one applicator QHS x 5 nights OR flagyl 500mg  bid x 7 days.  If pt chooses oral medication, please advise no ETOH while on medication.  I want her to stay on the antibiotic as well.

## 2017-12-06 NOTE — Telephone Encounter (Signed)
Patient notified- Rx sent to pharmacy. 

## 2017-12-06 NOTE — Telephone Encounter (Signed)
LM for pt to call back.

## 2017-12-08 ENCOUNTER — Encounter: Payer: Self-pay | Admitting: Obstetrics & Gynecology

## 2017-12-09 ENCOUNTER — Telehealth: Payer: Self-pay | Admitting: *Deleted

## 2017-12-09 NOTE — Telephone Encounter (Signed)
-----   Message from Megan Salon, MD sent at 12/08/2017  7:32 AM EDT ----- Pt has already been notified of affirm testing.  Please let her know that the urine culture showed mixed bacteria.  She should already be done with the antibiotics.  She did have blood in the dip u/a done in the office so when she is back from the beach, [place] like her to have a urine micro done.  I didn't have enough urine to do that test the day she was in the office.  Order placed.  Can you also get an update from her about how she is feeling?  Thanks.

## 2017-12-09 NOTE — Telephone Encounter (Signed)
LM for pt to call back.

## 2017-12-11 NOTE — Telephone Encounter (Signed)
LM for pt to call back.

## 2017-12-12 NOTE — Telephone Encounter (Signed)
Left voice mail to call back 

## 2017-12-17 NOTE — Telephone Encounter (Signed)
Notes recorded by Polly Cobia, CMA on 12/16/2017 at 11:12 AM EDT Pt called back and was notified. Verbalized understanding. Nurse visit scheduled 12/18/17  Patient states she is doing much better.

## 2017-12-18 ENCOUNTER — Ambulatory Visit (INDEPENDENT_AMBULATORY_CARE_PROVIDER_SITE_OTHER): Payer: 59

## 2017-12-18 DIAGNOSIS — R3129 Other microscopic hematuria: Secondary | ICD-10-CM

## 2017-12-18 NOTE — Progress Notes (Signed)
Patient is here for a urin culture. Patient not having any symptoms. We sent urin for culture. Patients understands she will be called with results.

## 2017-12-19 LAB — URINALYSIS, MICROSCOPIC ONLY: Casts: NONE SEEN /lpf

## 2018-02-06 DIAGNOSIS — H35361 Drusen (degenerative) of macula, right eye: Secondary | ICD-10-CM | POA: Diagnosis not present

## 2018-03-07 ENCOUNTER — Other Ambulatory Visit (HOSPITAL_COMMUNITY)
Admission: RE | Admit: 2018-03-07 | Discharge: 2018-03-07 | Disposition: A | Discharge status: Home / Self Care | Payer: 59 | Source: Ambulatory Visit | Attending: Obstetrics & Gynecology | Admitting: Obstetrics & Gynecology

## 2018-03-07 ENCOUNTER — Encounter: Payer: Self-pay | Admitting: Obstetrics & Gynecology

## 2018-03-07 ENCOUNTER — Telehealth: Payer: Self-pay | Admitting: Obstetrics & Gynecology

## 2018-03-07 ENCOUNTER — Ambulatory Visit (INDEPENDENT_AMBULATORY_CARE_PROVIDER_SITE_OTHER): Payer: 59 | Admitting: Obstetrics & Gynecology

## 2018-03-07 ENCOUNTER — Ambulatory Visit: Payer: 59 | Admitting: Obstetrics & Gynecology

## 2018-03-07 ENCOUNTER — Other Ambulatory Visit: Payer: Self-pay

## 2018-03-07 VITALS — BP 136/90 | HR 92 | Resp 18 | Ht 66.5 in | Wt 234.2 lb

## 2018-03-07 DIAGNOSIS — Z124 Encounter for screening for malignant neoplasm of cervix: Secondary | ICD-10-CM

## 2018-03-07 DIAGNOSIS — Z0000 Encounter for general adult medical examination without abnormal findings: Secondary | ICD-10-CM

## 2018-03-07 DIAGNOSIS — Z01419 Encounter for gynecological examination (general) (routine) without abnormal findings: Secondary | ICD-10-CM

## 2018-03-07 DIAGNOSIS — D508 Other iron deficiency anemias: Secondary | ICD-10-CM

## 2018-03-07 NOTE — Progress Notes (Signed)
62 y.o. [id number] Married White or Caucasian female here for annual exam.  Had a bleeding ulcer this year and some iron deficiency anemia.  Hb was 10.4.  The repeat was 14.2.  She's had a colonoscopy and endoscopy with small bowel follow-through.    Denies vaginal bleeding.    Having medication change with Dr. Toy Care.  Feels like she had a rash and itching from the medication.  This has completely resolved now that she has stopped the medication.    PCP:  Dr. Lorelei Pont.    Patient's last menstrual period was 01/28/2010.          Sexually active: No.  The current method of family planning is post menopausal status.    Exercising: No.   Smoker:  no  Health Maintenance: Pap:  11/07/15 Neg.   09/17/13 Neg. HR HPV:neg  History of abnormal Pap:  Yes, ASCUS 2013 MMG:  10/24/17 Diagnostic Right BIRADS2:benign. F/u 1 year  Colonoscopy:  10/09/17 f/u 7 years  BMD:   10/16/16 Normal  TDaP:  2019  Pneumonia vaccine(s):  n/a Shingrix:   Had 1  Hep C testing: 03/27/17 Neg  Screening Labs: PCP   reports that she quit smoking about 16 months ago. Her smoking use included cigarettes. She smoked 0.50 packs per day. She has never used smokeless tobacco. She reports that she drank alcohol. She reports that she does not use drugs.  Past Medical History:  Diagnosis Date  . Anemia   . Anxiety   . Arthritis   . Depression   . Diverticulitis   . Diverticulosis   . GERD (gastroesophageal reflux disease)    tums only PRN  . Ovarian cyst   . Prediabetes   . Sleep apnea    no CPAP    Past Surgical History:  Procedure Laterality Date  . BLADDER SUSPENSION N/A 04/24/2013   Procedure: TRANSVAGINAL TAPE (TVT) Exact PROCEDURE;  Surgeon: Elveria Royals, MD;  Location: [facility];  Service: Gynecology;  Laterality: N/A;  . CARPAL TUNNEL RELEASE Bilateral 1997  . COLONOSCOPY    . CYSTOSCOPY N/A 04/24/2013   Procedure: CYSTOSCOPY;  Surgeon: Elveria Royals, MD;  Location: [facility];  Service: Gynecology;  Laterality:  N/A;  . GIVENS CAPSULE STUDY N/A 10/21/2017   Procedure: GIVENS CAPSULE STUDY;  Surgeon: Doran Stabler, MD;  Location: [facility];  Service: Gastroenterology;  Laterality: N/A;  . LAPAROSCOPY Bilateral 05/03/2014   Procedure: LAPAROSCOPIC BILATERAL SALPINGO OOPHORECTOMY;  Surgeon: Lyman Speller, MD;  Location: [facility];  Service: Gynecology;  Laterality: Bilateral;  . TOTAL KNEE ARTHROPLASTY Bilateral 04/2010    Current Outpatient Medications  Medication Sig Dispense Refill  . ALPRAZolam (XANAX) 0.5 MG tablet Take 1 tablet by mouth daily.    Marland Kitchen buPROPion (WELLBUTRIN XL) 150 MG 24 hr tablet Take 150 mg by mouth daily.     . calcium carbonate (TUMS - DOSED IN MG ELEMENTAL CALCIUM) 500 MG chewable tablet Chew 2 tablets by mouth as needed for indigestion or heartburn.    . Calcium-Magnesium-Vitamin D (CALCIUM 1200+D3 PO) Take 1 tablet by mouth 2 (two) times daily.    Marland Kitchen docusate sodium (COLACE) 100 MG capsule Take 100 mg by mouth daily.    Marland Kitchen FIBER FORMULA CAPS Take 1 capsule by mouth 2 (two) times daily.     . fish oil-omega-3 fatty acids 1000 MG capsule Take 1 g by mouth daily.     Marland Kitchen imipramine (TOFRANIL) 50 MG tablet Take 50 mg by mouth  Daily.    Marland Kitchen VIIBRYD 20 MG TABS 1 tablet.      No current facility-administered medications for this visit.     Family History  Problem Relation Age of Onset  . Multiple myeloma Mother   . Early death Mother   . Heart attack Father   . Alcohol abuse Father   . COPD Father   . Cancer Maternal Grandmother   . Alcohol abuse Maternal Grandfather   . Depression Maternal Grandfather   . Heart disease Paternal Grandmother   . Colon cancer Neg Hx   . Stomach cancer Neg Hx   . Esophageal cancer Neg Hx   . Rectal cancer Neg Hx     Review of Systems  Skin:       Itching   Psychiatric/Behavioral:       Depression   All other systems reviewed and are negative.   Exam:   BP 136/90 (BP Location: Right Arm, Patient Position: Sitting, Cuff Size:  Large)   Pulse 92   Resp 18   Ht 5' 6.5" (1.689 m)   Wt 234 lb 3.2 oz (106.2 kg)   LMP 01/28/2010   BMI 37.23 kg/m    Height: 5' 6.5" (168.9 cm)  Ht Readings from Last 3 Encounters:  03/07/18 5' 6.5" (1.689 m)  12/18/17 '5\' 6"'  (1.676 m)  12/05/17 '5\' 6"'  (1.676 m)    General appearance: alert, cooperative and appears stated age Head: Normocephalic, without obvious abnormality, atraumatic Neck: no adenopathy, supple, symmetrical, trachea midline and thyroid normal to inspection and palpation Lungs: clear to auscultation bilaterally Breasts: normal appearance, no masses or tenderness Heart: regular rate and rhythm Abdomen: soft, non-tender; bowel sounds normal; no masses,  no organomegaly Extremities: extremities normal, atraumatic, no cyanosis or edema Skin: Skin color, texture, turgor normal. No rashes or lesions Lymph nodes: Cervical, supraclavicular, and axillary nodes normal. No abnormal inguinal nodes palpated Neurologic: Grossly normal   Pelvic: External genitalia:  no lesions              Urethra:  normal appearing urethra with no masses, tenderness or lesions              Bartholins and Skenes: normal                 Vagina: normal appearing vagina with normal color and discharge, no lesions              Cervix: no lesions              Pap taken: Yes.   Bimanual Exam:  Uterus:  normal size, contour, position, consistency, mobility, non-tender              Adnexa: normal adnexa and no mass, fullness, tenderness               Rectovaginal: Confirms               Anus:  normal sphincter tone, no lesions  Chaperone was present for exam.  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  Not taking iron at this time.  P:   Mammogram guidelines reviewed.  Doing yearly. pap smear and HR  HPV obtained today CBC obtained Vaccines and screening tests are UTD return annually or prn

## 2018-03-07 NOTE — Telephone Encounter (Signed)
Patient was seen today and would like her " thyroid  added to her labs drawn today".

## 2018-03-07 NOTE — Telephone Encounter (Signed)
Spoke with Phlebotomist. Cannot run thyroid testing off the lab tube that was drawn from her today.  Will advise.

## 2018-03-07 NOTE — Telephone Encounter (Signed)
Okay with Dr. Sabra Heck to order [facility] if patient would like to return to have drawn.

## 2018-03-08 LAB — CBC
HEMATOCRIT: 43.7 % (ref 34.0–46.6)
HEMOGLOBIN: 14 g/dL (ref 11.1–15.9)
MCH: 29.3 pg (ref 26.6–33.0)
MCHC: 32 g/dL (ref 31.5–35.7)
MCV: 91 fL (ref 79–97)
Platelets: 315 10*3/uL (ref 150–450)
RBC: 4.78 x10E6/uL (ref 3.77–5.28)
RDW: 13.2 % (ref 12.3–15.4)
WBC: 6.2 10*3/uL (ref 3.4–10.8)

## 2018-03-10 LAB — CYTOLOGY - PAP
Diagnosis: NEGATIVE
HPV: NOT DETECTED

## 2018-03-10 NOTE — Telephone Encounter (Signed)
Spoke with patient and lab appointment scheduled.  

## 2018-03-12 ENCOUNTER — Other Ambulatory Visit (INDEPENDENT_AMBULATORY_CARE_PROVIDER_SITE_OTHER): Payer: 59

## 2018-03-12 DIAGNOSIS — Z0000 Encounter for general adult medical examination without abnormal findings: Secondary | ICD-10-CM

## 2018-03-13 LAB — TSH: TSH: 3.43 u[IU]/mL (ref 0.450–4.500)

## 2018-03-14 ENCOUNTER — Telehealth: Payer: Self-pay | Admitting: Obstetrics & Gynecology

## 2018-04-02 DIAGNOSIS — H11153 Pinguecula, bilateral: Secondary | ICD-10-CM | POA: Diagnosis not present

## 2018-04-02 DIAGNOSIS — H04123 Dry eye syndrome of bilateral lacrimal glands: Secondary | ICD-10-CM | POA: Diagnosis not present

## 2018-04-02 DIAGNOSIS — H02833 Dermatochalasis of right eye, unspecified eyelid: Secondary | ICD-10-CM | POA: Diagnosis not present

## 2018-04-09 DIAGNOSIS — S0512XA Contusion of eyeball and orbital tissues, left eye, initial encounter: Secondary | ICD-10-CM | POA: Diagnosis not present

## 2018-05-08 ENCOUNTER — Ambulatory Visit (INDEPENDENT_AMBULATORY_CARE_PROVIDER_SITE_OTHER): Payer: 59 | Admitting: Family Medicine

## 2018-05-08 ENCOUNTER — Encounter: Payer: Self-pay | Admitting: Family Medicine

## 2018-05-08 VITALS — BP 130/88 | HR 103 | Temp 98.1°F | Resp 18 | Ht 66.5 in | Wt 229.0 lb

## 2018-05-08 DIAGNOSIS — J209 Acute bronchitis, unspecified: Secondary | ICD-10-CM

## 2018-05-08 MED ORDER — DOXYCYCLINE HYCLATE 100 MG PO TABS: 100.0000 mg | ORAL_TABLET | Freq: Two times a day (BID) | ORAL | 0 refills | Status: DC

## 2018-05-08 MED ORDER — ALBUTEROL SULFATE HFA 108 (90 BASE) MCG/ACT IN AERS: 2.0000 | INHALATION_SPRAY | Freq: Four times a day (QID) | RESPIRATORY_TRACT | 0 refills | Status: DC | PRN

## 2018-05-08 NOTE — Progress Notes (Signed)
[facility] [place], [place], [place] [address] [phone]  Date:  05/08/2018   Name:  Jasmine [name]   DOB:  June 15, 1955   MRN:  [id number]  PCP:  Darreld Mclean, MD    Chief Complaint: URI (2 weeks, congestion, headache, cough worse at night, wheezing, no fever or sore throat, taking nyquil and ibuprofen)   History of Present Illness:  Jasmine [name] is a 63 y.o. very pleasant female patient who presents with the following:  She has been sick for about 2 weeks now She thinks her grand-daughter got her sick She has noted a cough, sinus congestion Not really getting better No ST, no fever No ear pain  The cough can be productive at times Mostly clear mucus She is blowing discolored material from her sinuses howevre   No GI symptoms  She is wheezing sometimes   OTC she has used some nyquil prn, some ibuprofen prn for HA   History of anemia, GERD, sleep apnea, hyperlipidemia  Patient Active Problem List   Diagnosis Date Noted  . Elevated transaminase level 06/05/2017  . Hyperlipidemia 06/05/2017  . Anemia, unspecified 06/05/2017  . GERD (gastroesophageal reflux disease) 06/05/2017  . OSA (obstructive sleep apnea) 06/05/2017  . Obesity with body mass index (BMI) of 30.0 to 39.9 06/05/2017  . Urinary incontinence 09/18/2012  . Diverticulitis of colon 07/30/2012  . Adjustment disorder with mixed anxiety and depressed mood 07/28/2012    Past Medical History:  Diagnosis Date  . Anemia   . Anxiety   . Arthritis   . Depression   . Diverticulitis   . Diverticulosis   . GERD (gastroesophageal reflux disease)    tums only PRN  . Ovarian cyst   . Prediabetes   . Sleep apnea    no CPAP    Past Surgical History:  Procedure Laterality Date  . BLADDER SUSPENSION N/A 04/24/2013   Procedure: TRANSVAGINAL TAPE (TVT) Exact PROCEDURE;  Surgeon: Elveria Royals, MD;  Location: [facility];  Service:  Gynecology;  Laterality: N/A;  . CARPAL TUNNEL RELEASE Bilateral 1997  . COLONOSCOPY    . CYSTOSCOPY N/A 04/24/2013   Procedure: CYSTOSCOPY;  Surgeon: Elveria Royals, MD;  Location: [facility];  Service: Gynecology;  Laterality: N/A;  . GIVENS CAPSULE STUDY N/A 10/21/2017   Procedure: GIVENS CAPSULE STUDY;  Surgeon: Doran Stabler, MD;  Location: [facility];  Service: Gastroenterology;  Laterality: N/A;  . LAPAROSCOPY Bilateral 05/03/2014   Procedure: LAPAROSCOPIC BILATERAL SALPINGO OOPHORECTOMY;  Surgeon: Lyman Speller, MD;  Location: [facility];  Service: Gynecology;  Laterality: Bilateral;  . TOTAL KNEE ARTHROPLASTY Bilateral 04/2010    Social History   Tobacco Use  . Smoking status: Former Smoker    Packs/day: 0.50    Types: Cigarettes    Last attempt to quit: 10/28/2016    Years since quitting: 1.5  . Smokeless tobacco: Never Used  . Tobacco comment: 1/2 PPD  Substance Use Topics  . Alcohol use: Not Currently  . Drug use: No    Family History  Problem Relation Age of Onset  . Multiple myeloma Mother   . Early death Mother   . Heart attack Father   . Alcohol abuse Father   . COPD Father   . Cancer Maternal Grandmother   . Alcohol abuse Maternal Grandfather   . Depression Maternal Grandfather   . Heart disease Paternal Grandmother   .  Colon cancer Neg Hx   . Stomach cancer Neg Hx   . Esophageal cancer Neg Hx   . Rectal cancer Neg Hx     Allergies  Allergen Reactions  . Trintellix [Vortioxetine] Itching and Rash    Medication list has been reviewed and updated.  Current Outpatient Medications on File Prior to Visit  Medication Sig Dispense Refill  . ALPRAZolam (XANAX) 0.5 MG tablet Take 1 tablet by mouth daily.    . buPROPion (WELLBUTRIN XL) 150 MG 24 hr tablet Take 150 mg by mouth daily.     . calcium carbonate (TUMS - DOSED IN MG ELEMENTAL CALCIUM) 500 MG chewable tablet Chew 2 tablets by mouth as needed for indigestion or heartburn.    .  Calcium-Magnesium-Vitamin D (CALCIUM 1200+D3 PO) Take 1 tablet by mouth 2 (two) times daily.    . docusate sodium (COLACE) 100 MG capsule Take 100 mg by mouth daily.    . FIBER FORMULA CAPS Take 1 capsule by mouth 2 (two) times daily.     . fish oil-omega-3 fatty acids 1000 MG capsule Take 1 g by mouth daily.     . imipramine (TOFRANIL) 50 MG tablet Take 50 mg by mouth Daily.     No current facility-administered medications on file prior to visit.     Review of Systems:  As per HPI- otherwise negative.   Physical Examination: Vitals:   05/08/18 1531  BP: 130/88  Pulse: (!) 103  Resp: 18  Temp: 98.1 F (36.7 C)  SpO2: 97%   Vitals:   05/08/18 1531  Weight: 229 lb (103.9 kg)  Height: 5' 6.5" (1.689 m)   Body mass index is 36.41 kg/m. Ideal Body Weight: Weight in (lb) to have BMI = 25: 156.9  GEN: WDWN, NAD, Non-toxic, A & O x 3, overweight, looks well  HEENT: Atraumatic, Normocephalic. Neck supple. No masses, No LAD.Bilateral TM wnl, oropharynx normal.  PEERL,EOMI.   Ears and Nose: No external deformity. CV: RRR, No M/G/R. No JVD. No thrill. No extra heart sounds. PULM: no crackles, rhonchi. No retractions. No resp. distress. No accessory muscle use. ABD: S, NT, ND, +BS. No rebound. No HSM. EXTR: No c/c/e NEURO Normal gait.  PSYCH: Normally interactive. Conversant. Not depressed or anxious appearing.  Calm demeanor.  She is wheezing and has some congestion heard in the right lung today   Assessment and Plan: Acute bronchitis, unspecified organism - Plan: doxycycline (VIBRA-TABS) 100 MG tablet, albuterol (PROVENTIL HFA;VENTOLIN HFA) 108 (90 Base) MCG/ACT inhaler  Here today with acute bronchitis.  Treat with doxycycline, and albuterol as needed.  She will let me know if not feeling better next few days, sooner if getting worse  Signed Jessica Copland, MD  

## 2018-05-08 NOTE — Patient Instructions (Addendum)
It was good to see you today!  We are going to treat you for bronchitis with doxycycline twice a day for 10 days Albuterol inhaler as needed for wheezing or cough   Please let me know if you are not feeling better in the next few days- Sooner if worse.

## 2018-06-17 ENCOUNTER — Ambulatory Visit (INDEPENDENT_AMBULATORY_CARE_PROVIDER_SITE_OTHER): Payer: 59 | Admitting: *Deleted

## 2018-06-17 DIAGNOSIS — Z23 Encounter for immunization: Secondary | ICD-10-CM

## 2018-06-17 NOTE — Progress Notes (Signed)
Patient came in for 2nd shingles vaccine  Immunization given and patient tolerated well.

## 2018-09-25 DIAGNOSIS — L720 Epidermal cyst: Secondary | ICD-10-CM | POA: Diagnosis not present

## 2018-09-25 DIAGNOSIS — D2371 Other benign neoplasm of skin of right lower limb, including hip: Secondary | ICD-10-CM | POA: Diagnosis not present

## 2018-09-25 DIAGNOSIS — L603 Nail dystrophy: Secondary | ICD-10-CM | POA: Diagnosis not present

## 2018-10-07 ENCOUNTER — Telehealth: Payer: Self-pay | Admitting: *Deleted

## 2018-10-07 NOTE — Telephone Encounter (Signed)
Left voicemail for patient. Form ready to pick up at the front desk.

## 2018-10-23 ENCOUNTER — Encounter: Payer: Self-pay | Admitting: Obstetrics & Gynecology

## 2018-11-13 ENCOUNTER — Telehealth: Payer: Self-pay | Admitting: Obstetrics & Gynecology

## 2018-11-13 NOTE — Telephone Encounter (Signed)
Spoke with patient. Reports cloudy urine with odor and vaginal itching for 2 wks. Denies dysuria, flank pain, fever/chills or vag d/c or bleeding. Patient is agreeable to schedule with first available provider. OV scheduled for 7/17 at 4pm with Melvia Heaps, CNM, patient declined 11am appt time. Covid 19 prescreen negative, precautions reviewed.   Routing to provider for final review. Patient is agreeable to disposition. Will close encounter.  Cc: Melvia Heaps, CNM

## 2018-11-13 NOTE — Telephone Encounter (Signed)
Patient left voicemail during lunch. States she believes she has vaginitis. No available appointments.

## 2018-11-14 ENCOUNTER — Encounter: Payer: Self-pay | Admitting: Certified Nurse Midwife

## 2018-11-14 ENCOUNTER — Other Ambulatory Visit: Payer: Self-pay | Admitting: Certified Nurse Midwife

## 2018-11-14 ENCOUNTER — Ambulatory Visit (INDEPENDENT_AMBULATORY_CARE_PROVIDER_SITE_OTHER): Payer: Commercial Managed Care - PPO | Admitting: Certified Nurse Midwife

## 2018-11-14 ENCOUNTER — Other Ambulatory Visit: Payer: Self-pay

## 2018-11-14 VITALS — BP 130/80 | HR 76 | Temp 97.2°F | Resp 14 | Ht 67.0 in | Wt 224.5 lb

## 2018-11-14 DIAGNOSIS — N952 Postmenopausal atrophic vaginitis: Secondary | ICD-10-CM | POA: Diagnosis not present

## 2018-11-14 DIAGNOSIS — R8290 Unspecified abnormal findings in urine: Secondary | ICD-10-CM | POA: Diagnosis not present

## 2018-11-14 DIAGNOSIS — N898 Other specified noninflammatory disorders of vagina: Secondary | ICD-10-CM | POA: Diagnosis not present

## 2018-11-14 LAB — POCT URINALYSIS DIPSTICK
Bilirubin, UA: NEGATIVE
Blood, UA: NEGATIVE
Glucose, UA: NEGATIVE
Ketones, UA: NEGATIVE
Leukocytes, UA: NEGATIVE
Nitrite, UA: NEGATIVE
Protein, UA: NEGATIVE
Urobilinogen, UA: 0.2 E.U./dL
pH, UA: 7 (ref 5.0–8.0)

## 2018-11-14 NOTE — Progress Notes (Signed)
63 y.o. [name] [phone] here with complaint of vaginal symptoms of itching, burning, in vagina and increase discharge. Describes discharge as none that she is aware.. Onset of symptoms 14 days ago. Denies new personal products or vaginal dryness that she is aware of..Not particularly sexual activity so she does not know if uncomfortable. no STD concerns. Urinary symptoms cloudy urine only, no burning or frequency/urgency or back pain. . Menopausal no HRT. Just wanted to make sure no problems.  Review of Systems  Constitutional: Negative.   HENT: Negative.   Eyes: Negative.   Respiratory: Negative.   Cardiovascular: Negative.   Gastrointestinal: Negative.   Genitourinary:       Cloudy urine Vaginal itching   Musculoskeletal: Negative.   Skin: Negative.   Neurological: Negative.   Endo/Heme/Allergies: Negative.   Psychiatric/Behavioral: Negative.     O:Healthy female WDWN Affect: normal, orientation x 3  Exam:Skin: warm and dry Abdomen: soft, non tender  Inguinal Lymph nodes: no enlargement or tenderness Pelvic exam: External genital: normal female BUS: negative Vagina: Slight atrophic appearance with scant white thin non odorous discharge noted.  Affirm taken Cervix: normal, non tender, no CMT Uterus: normal, non tender Adnexa:normal, non tender, no masses or fullness noted   A:Normal pelvic exam Vaginal dryness suspected POCT urine negative   P:Discussed findings of vaginal dryness only. Discussed vaginal dryness and etiology.Coconut Oil or Olive for use in vagina and external tissue for skin protection. Recommend trial of either and see if symptoms improve. Questions addressed. Lab: Affirm will treat if indicated.  Rv prn

## 2018-11-15 LAB — VAGINITIS/VAGINOSIS, DNA PROBE
Candida Species: NEGATIVE
Gardnerella vaginalis: NEGATIVE
Trichomonas vaginosis: NEGATIVE

## 2018-12-17 NOTE — Progress Notes (Addendum)
[facility] [place], [place], [place] [address] [phone]  Date:  12/22/2018   Name:  Jasmine [name]   DOB:  July 20, 1955   MRN:  [id number]  PCP:  Darreld Mclean, MD    Chief Complaint: Diverticulitis (would like something to take for flare ups, left lower side pain)   History of Present Illness:  Jasmine [name] is a 63 y.o. very pleasant female patient who presents with the following:  Pt with history of OSA, GERD, diverticulitis, hyperlipidemia, anemia Last seen by myself for a sick visit in January  Here today with concern of diverticulitis flare - about 2 weeks ago she had a flare of pain  This is mostly better now She may get a mild flare up every couple of months or less often She will use ibuprofen and just get through it.  She does not generally change her diet when she has a flare up -we will just let her run its course No vomiting or fever Mild pain in the LLQ   Needs flu shot- will do today shingrix done  Can also do routine labs- it has been a year - will do today   She is fasting this am except for some sweet tea  Her GI is Dr Loletha Carrow   Colonoscopy done last year   Pulse Readings from Last 3 Encounters:  12/22/18 98  11/14/18 76  05/08/18 (!) 103     Patient Active Problem List   Diagnosis Date Noted  . Elevated transaminase level 06/05/2017  . Hyperlipidemia 06/05/2017  . Anemia, unspecified 06/05/2017  . GERD (gastroesophageal reflux disease) 06/05/2017  . OSA (obstructive sleep apnea) 06/05/2017  . Obesity with body mass index (BMI) of 30.0 to 39.9 06/05/2017  . Urinary incontinence 09/18/2012  . Diverticulitis of colon 07/30/2012  . Adjustment disorder with mixed anxiety and depressed mood 07/28/2012    Past Medical History:  Diagnosis Date  . Anemia   . Anxiety   . Arthritis   . Depression   . Diverticulitis   . Diverticulosis   . GERD (gastroesophageal reflux  disease)    tums only PRN  . Ovarian cyst   . Prediabetes   . Sleep apnea    no CPAP    Past Surgical History:  Procedure Laterality Date  . BLADDER SUSPENSION N/A 04/24/2013   Procedure: TRANSVAGINAL TAPE (TVT) Exact PROCEDURE;  Surgeon: Elveria Royals, MD;  Location: [facility];  Service: Gynecology;  Laterality: N/A;  . CARPAL TUNNEL RELEASE Bilateral 1997  . COLONOSCOPY    . CYSTOSCOPY N/A 04/24/2013   Procedure: CYSTOSCOPY;  Surgeon: Elveria Royals, MD;  Location: [facility];  Service: Gynecology;  Laterality: N/A;  . GIVENS CAPSULE STUDY N/A 10/21/2017   Procedure: GIVENS CAPSULE STUDY;  Surgeon: Doran Stabler, MD;  Location: [facility];  Service: Gastroenterology;  Laterality: N/A;  . LAPAROSCOPY Bilateral 05/03/2014   Procedure: LAPAROSCOPIC BILATERAL SALPINGO OOPHORECTOMY;  Surgeon: Lyman Speller, MD;  Location: [facility];  Service: Gynecology;  Laterality: Bilateral;  . TOTAL KNEE ARTHROPLASTY Bilateral 04/2010    Social History   Tobacco Use  . Smoking status: Former Smoker    Packs/day: 0.50    Types: Cigarettes    Quit date: 10/28/2016    Years since quitting: 2.1  . Smokeless tobacco: Never Used  . Tobacco comment: 1/2 PPD  Substance Use Topics  . Alcohol use: Not Currently  .  Drug use: No    Family History  Problem Relation Age of Onset  . Multiple myeloma Mother   . Early death Mother   . Heart attack Father   . Alcohol abuse Father   . COPD Father   . Cancer Maternal Grandmother   . Alcohol abuse Maternal Grandfather   . Depression Maternal Grandfather   . Heart disease Paternal Grandmother   . Colon cancer Neg Hx   . Stomach cancer Neg Hx   . Esophageal cancer Neg Hx   . Rectal cancer Neg Hx     Allergies  Allergen Reactions  . Trintellix [Vortioxetine] Itching and Rash    Medication list has been reviewed and updated.  Current Outpatient Medications on File Prior to Visit  Medication Sig Dispense Refill  . ALPRAZolam (XANAX) 0.5 MG  tablet Take 1 tablet by mouth daily.    . calcium carbonate (TUMS - DOSED IN MG ELEMENTAL CALCIUM) 500 MG chewable tablet Chew 2 tablets by mouth as needed for indigestion or heartburn.    . Calcium-Magnesium-Vitamin D (CALCIUM 1200+D3 PO) Take 1 tablet by mouth 2 (two) times daily.    . docusate sodium (COLACE) 100 MG capsule Take 100 mg by mouth daily.    . FIBER FORMULA CAPS Take 1 capsule by mouth 2 (two) times daily.     . fish oil-omega-3 fatty acids 1000 MG capsule Take 1 g by mouth daily.     . imipramine (TOFRANIL) 50 MG tablet Take 50 mg by mouth Daily.    . PARoxetine (PAXIL) 40 MG tablet      No current facility-administered medications on file prior to visit.     Review of Systems:  As per HPI- otherwise negative. No fever or chills  Physical Examination: Vitals:   12/22/18 1047  BP: 138/90  Pulse: 98  Resp: 16  Temp: (!) 97.2 F (36.2 C)  SpO2: 97%   Vitals:   12/22/18 1047  Weight: 222 lb (100.7 kg)  Height: 5' 7" (1.702 m)   Body mass index is 34.77 kg/m. Ideal Body Weight: Weight in (lb) to have BMI = 25: 159.3  GEN: WDWN, NAD, Non-toxic, A & O x 3, overweight, looks well HEENT: Atraumatic, Normocephalic. Neck supple. No masses, No LAD.  TM within normal limits Ears and Nose: No external deformity. CV: RRR, No M/G/R. No JVD. No thrill. No extra heart sounds. PULM: CTA B, no wheezes, crackles, rhonchi. No retractions. No resp. distress. No accessory muscle use. ABD: S, NT, ND, +BS. No rebound. No HSM.  Belly is benign EXTR: No c/c/e NEURO Normal gait.  PSYCH: Normally interactive. Conversant. Not depressed or anxious appearing.  Calm demeanor.    Assessment and Plan:   ICD-10-CM   1. Diverticulitis of colon  K57.32 amoxicillin-clavulanate (AUGMENTIN) 875-125 MG tablet  2. Iron deficiency anemia, unspecified iron deficiency anemia type  D50.9 CBC    Ferritin  3. Hyperlipidemia, unspecified hyperlipidemia type  E78.5 Lipid panel  4. Elevated  transaminase level  R74.0 Comprehensive metabolic panel  5. Screening for HIV (human immunodeficiency virus)  Z11.4 HIV Antibody (routine testing w rflx)  6. Screening for diabetes mellitus  Z13.1 Hemoglobin A1c  7. Needs flu shot  Z23 Flu Vaccine QUAD 6+ mos PF IM (Fluarix Quad PF)   Here today to discuss a couple of issues Routine labs pending as above Get flu shot She has noted intermittent symptoms of possible diverticulitis.  Discussed conservative treatment when she has a flare, such as   dietary modification for a few days.  I did give her a prescription for Augmentin to hang onto, if symptoms are more severe she may want to have this on hand.  For a mild diverticulitis flare, I would recommend that you use a "diverticulitis diet" for a coupe of days or until symptoms remit.  If you don't feel like eating liquids only are ok.  If you are hungry, stick to a soft and easy to digest diet such as bananas, applesauce, oatmeal, etc.  If this does not calm things down- or if symptoms seem more severe- start on the augmentin rx I gave you today and call me or Dr. Danis.  You can also use ibuprofen or tylenol as needed for pain symptoms   Of course if very severe or any distress go to the ER   Follow-up: No follow-ups on file.  Meds ordered this encounter  Medications  . amoxicillin-clavulanate (AUGMENTIN) 875-125 MG tablet    Sig: Take 1 tablet by mouth 2 (two) times daily. Use if needed for diverticulitis flare    Dispense:  20 tablet    Refill:  0   Orders Placed This Encounter  Procedures  . Flu Vaccine QUAD 6+ mos PF IM (Fluarix Quad PF)  . CBC  . Comprehensive metabolic panel  . Hemoglobin A1c  . Lipid panel  . HIV Antibody (routine testing w rflx)  . Ferritin    @SIGN@    Signed Jessica Copland, MD  Received her labs 8/25, letter to patient Mild elevation of alk phos going back to 2011 Liver ultrasound 2018 was normal Results for orders placed or performed in visit on  12/22/18  CBC  Result Value Ref Range   WBC 6.3 4.0 - 10.5 K/uL   RBC 4.43 3.87 - 5.11 Mil/uL   Platelets 368.0 150.0 - 400.0 K/uL   Hemoglobin 12.2 12.0 - 15.0 g/dL   HCT 37.7 36.0 - 46.0 %   MCV 85.1 78.0 - 100.0 fl   MCHC 32.3 30.0 - 36.0 g/dL   RDW 15.0 11.5 - 15.5 %  Comprehensive metabolic panel  Result Value Ref Range   Sodium 139 135 - 145 mEq/L   Potassium 5.1 3.5 - 5.1 mEq/L   Chloride 102 96 - 112 mEq/L   CO2 29 19 - 32 mEq/L   Glucose, Bld 97 70 - 99 mg/dL   BUN 14 6 - 23 mg/dL   Creatinine, Ser 0.91 0.40 - 1.20 mg/dL   Total Bilirubin 0.3 0.2 - 1.2 mg/dL   Alkaline Phosphatase 129 (H) 39 - 117 U/L   AST 14 0 - 37 U/L   ALT 14 0 - 35 U/L   Total Protein 6.7 6.0 - 8.3 g/dL   Albumin 4.1 3.5 - 5.2 g/dL   Calcium 10.4 8.4 - 10.5 mg/dL   GFR 62.23 >60.00 mL/min  Hemoglobin A1c  Result Value Ref Range   Hgb A1c MFr Bld 5.9 4.6 - 6.5 %  Lipid panel  Result Value Ref Range   Cholesterol 217 (H) 0 - 200 mg/dL   Triglycerides 97.0 0.0 - 149.0 mg/dL   HDL 59.30 >39.00 mg/dL   VLDL 19.4 0.0 - 40.0 mg/dL   LDL Cholesterol 138 (H) 0 - 99 mg/dL   Total CHOL/HDL Ratio 4    NonHDL 157.65   HIV Antibody (routine testing w rflx)  Result Value Ref Range   HIV 1&2 Ab, 4th Generation NON-REACTIVE NON-REACTI  Ferritin  Result Value Ref Range     Ferritin 31.9 10.0 - 291.0 ng/mL     

## 2018-12-19 NOTE — Patient Instructions (Addendum)
Good to see you today- I will be in touch with your labs asap! Flu shot given today  For a mild diverticulitis flare, I would recommend that you use a "diverticulitis diet" for a coupe of days or until symptoms remit.  If you don't feel like eating liquids only are ok.  If you are hungry, stick to a soft and easy to digest diet such as bananas, applesauce, oatmeal, etc.  If this does not calm things down- or if symptoms seem more severe- start on the augmentin rx I gave you today and call me or Dr. Loletha Carrow.  You can also use ibuprofen or tylenol as needed for pain symptoms   Of course if very severe or any distress go to the ER

## 2018-12-22 ENCOUNTER — Encounter: Payer: Self-pay | Admitting: Family Medicine

## 2018-12-22 ENCOUNTER — Other Ambulatory Visit: Payer: Self-pay

## 2018-12-22 ENCOUNTER — Ambulatory Visit (INDEPENDENT_AMBULATORY_CARE_PROVIDER_SITE_OTHER): Payer: Commercial Managed Care - PPO | Admitting: Family Medicine

## 2018-12-22 VITALS — BP 138/90 | HR 98 | Temp 97.2°F | Resp 16 | Ht 67.0 in | Wt 222.0 lb

## 2018-12-22 DIAGNOSIS — Z23 Encounter for immunization: Secondary | ICD-10-CM

## 2018-12-22 DIAGNOSIS — Z131 Encounter for screening for diabetes mellitus: Secondary | ICD-10-CM | POA: Diagnosis not present

## 2018-12-22 DIAGNOSIS — K5732 Diverticulitis of large intestine without perforation or abscess without bleeding: Secondary | ICD-10-CM | POA: Diagnosis not present

## 2018-12-22 DIAGNOSIS — R740 Nonspecific elevation of levels of transaminase and lactic acid dehydrogenase [LDH]: Secondary | ICD-10-CM

## 2018-12-22 DIAGNOSIS — R7401 Elevation of levels of liver transaminase levels: Secondary | ICD-10-CM

## 2018-12-22 DIAGNOSIS — Z114 Encounter for screening for human immunodeficiency virus [HIV]: Secondary | ICD-10-CM

## 2018-12-22 DIAGNOSIS — D509 Iron deficiency anemia, unspecified: Secondary | ICD-10-CM | POA: Diagnosis not present

## 2018-12-22 DIAGNOSIS — E785 Hyperlipidemia, unspecified: Secondary | ICD-10-CM | POA: Diagnosis not present

## 2018-12-22 LAB — LIPID PANEL
Cholesterol: 217 mg/dL — ABNORMAL HIGH (ref 0–200)
HDL: 59.3 mg/dL (ref >39.00)
LDL Cholesterol: 138 mg/dL — ABNORMAL HIGH (ref 0–99)
NonHDL: 157.65
Total CHOL/HDL Ratio: 4
Triglycerides: 97 mg/dL (ref 0.0–149.0)
VLDL: 19.4 mg/dL (ref 0.0–40.0)

## 2018-12-22 LAB — COMPREHENSIVE METABOLIC PANEL
ALT: 14 U/L (ref 0–35)
AST: 14 U/L (ref 0–37)
Albumin: 4.1 g/dL (ref 3.5–5.2)
Alkaline Phosphatase: 129 U/L — ABNORMAL HIGH (ref 39–117)
BUN: 14 mg/dL (ref 6–23)
CO2: 29 mEq/L (ref 19–32)
Calcium: 10.4 mg/dL (ref 8.4–10.5)
Chloride: 102 mEq/L (ref 96–112)
Creatinine, Ser: 0.91 mg/dL (ref 0.40–1.20)
GFR: 62.23 mL/min (ref >60.00)
Glucose, Bld: 97 mg/dL (ref 70–99)
Potassium: 5.1 mEq/L (ref 3.5–5.1)
Sodium: 139 mEq/L (ref 135–145)
Total Bilirubin: 0.3 mg/dL (ref 0.2–1.2)
Total Protein: 6.7 g/dL (ref 6.0–8.3)

## 2018-12-22 LAB — CBC
HCT: 37.7 % (ref 36.0–46.0)
Hemoglobin: 12.2 g/dL (ref 12.0–15.0)
MCHC: 32.3 g/dL (ref 30.0–36.0)
MCV: 85.1 fl (ref 78.0–100.0)
Platelets: 368 10*3/uL (ref 150.0–400.0)
RBC: 4.43 Mil/uL (ref 3.87–5.11)
RDW: 15 % (ref 11.5–15.5)
WBC: 6.3 10*3/uL (ref 4.0–10.5)

## 2018-12-22 LAB — HEMOGLOBIN A1C: Hgb A1c MFr Bld: 5.9 % (ref 4.6–6.5)

## 2018-12-22 MED ORDER — AMOXICILLIN-POT CLAVULANATE 875-125 MG PO TABS: 1.0000 | ORAL_TABLET | Freq: Two times a day (BID) | ORAL | 0 refills | Status: DC

## 2018-12-23 LAB — FERRITIN: Ferritin: 31.9 ng/mL (ref 10.0–291.0)

## 2018-12-23 LAB — HIV ANTIBODY (ROUTINE TESTING W REFLEX): HIV 1&2 Ab, 4th Generation: NONREACTIVE

## 2018-12-29 ENCOUNTER — Telehealth: Payer: Self-pay | Admitting: General Surgery

## 2018-12-29 ENCOUNTER — Ambulatory Visit: Payer: 59 | Admitting: Physician Assistant

## 2018-12-29 NOTE — Telephone Encounter (Signed)
No show letter

## 2018-12-29 NOTE — Telephone Encounter (Signed)
Erroneous

## 2019-06-05 ENCOUNTER — Emergency Department (HOSPITAL_COMMUNITY): Payer: Commercial Managed Care - PPO

## 2019-06-05 ENCOUNTER — Encounter (HOSPITAL_COMMUNITY): Payer: Self-pay | Admitting: Student

## 2019-06-05 ENCOUNTER — Other Ambulatory Visit: Payer: Self-pay

## 2019-06-05 ENCOUNTER — Emergency Department (HOSPITAL_COMMUNITY)
Admission: EM | Admit: 2019-06-05 | Discharge: 2019-06-05 | Disposition: A | Discharge status: Home / Self Care | Payer: Commercial Managed Care - PPO | Attending: Emergency Medicine | Admitting: Emergency Medicine

## 2019-06-05 DIAGNOSIS — D649 Anemia, unspecified: Secondary | ICD-10-CM | POA: Insufficient documentation

## 2019-06-05 DIAGNOSIS — N200 Calculus of kidney: Secondary | ICD-10-CM | POA: Insufficient documentation

## 2019-06-05 DIAGNOSIS — R109 Unspecified abdominal pain: Secondary | ICD-10-CM | POA: Diagnosis present

## 2019-06-05 DIAGNOSIS — Z87891 Personal history of nicotine dependence: Secondary | ICD-10-CM | POA: Diagnosis not present

## 2019-06-05 LAB — URINALYSIS, ROUTINE W REFLEX MICROSCOPIC
Bilirubin Urine: NEGATIVE
Glucose, UA: NEGATIVE mg/dL
Ketones, ur: NEGATIVE mg/dL
Nitrite: NEGATIVE
Protein, ur: NEGATIVE mg/dL
RBC / HPF: >50 RBC/hpf — ABNORMAL HIGH (ref 0–5)
Specific Gravity, Urine: 1.012 (ref 1.005–1.030)
pH: 7 (ref 5.0–8.0)

## 2019-06-05 LAB — COMPREHENSIVE METABOLIC PANEL
ALT: 24 U/L (ref 0–44)
AST: 21 U/L (ref 15–41)
Albumin: 3.6 g/dL (ref 3.5–5.0)
Alkaline Phosphatase: 122 U/L (ref 38–126)
Anion gap: 11 (ref 5–15)
BUN: 22 mg/dL (ref 8–23)
CO2: 27 mmol/L (ref 22–32)
Calcium: 9.6 mg/dL (ref 8.9–10.3)
Chloride: 101 mmol/L (ref 98–111)
Creatinine, Ser: 1.27 mg/dL — ABNORMAL HIGH (ref 0.44–1.00)
GFR calc Af Amer: 52 mL/min — ABNORMAL LOW (ref >60)
GFR calc non Af Amer: 45 mL/min — ABNORMAL LOW (ref >60)
Glucose, Bld: 130 mg/dL — ABNORMAL HIGH (ref 70–99)
Potassium: 3.8 mmol/L (ref 3.5–5.1)
Sodium: 139 mmol/L (ref 135–145)
Total Bilirubin: 0.3 mg/dL (ref 0.3–1.2)
Total Protein: 7 g/dL (ref 6.5–8.1)

## 2019-06-05 LAB — CBC WITH DIFFERENTIAL/PLATELET
Abs Immature Granulocytes: 0.03 10*3/uL (ref 0.00–0.07)
Basophils Absolute: 0.1 10*3/uL (ref 0.0–0.1)
Basophils Relative: 1 %
Eosinophils Absolute: 0.1 10*3/uL (ref 0.0–0.5)
Eosinophils Relative: 1 %
HCT: 34.4 % — ABNORMAL LOW (ref 36.0–46.0)
Hemoglobin: 10.5 g/dL — ABNORMAL LOW (ref 12.0–15.0)
Immature Granulocytes: 0 %
Lymphocytes Relative: 10 %
Lymphs Abs: 0.9 10*3/uL (ref 0.7–4.0)
MCH: 23.9 pg — ABNORMAL LOW (ref 26.0–34.0)
MCHC: 30.5 g/dL (ref 30.0–36.0)
MCV: 78.2 fL — ABNORMAL LOW (ref 80.0–100.0)
Monocytes Absolute: 0.5 10*3/uL (ref 0.1–1.0)
Monocytes Relative: 6 %
Neutro Abs: 7.7 10*3/uL (ref 1.7–7.7)
Neutrophils Relative %: 82 %
Platelets: 374 10*3/uL (ref 150–400)
RBC: 4.4 MIL/uL (ref 3.87–5.11)
RDW: 17.5 % — ABNORMAL HIGH (ref 11.5–15.5)
WBC: 9.3 10*3/uL (ref 4.0–10.5)
nRBC: 0 % (ref 0.0–0.2)

## 2019-06-05 LAB — LIPASE, BLOOD: Lipase: 28 U/L (ref 11–51)

## 2019-06-05 MED ORDER — SODIUM CHLORIDE 0.9 % IV BOLUS
1000.0000 mL | Freq: Once | INTRAVENOUS | Status: AC
  Administered 2019-06-05: 1000 mL via INTRAVENOUS

## 2019-06-05 MED ORDER — TAMSULOSIN HCL 0.4 MG PO CAPS: 0.4000 mg | ORAL_CAPSULE | Freq: Every day | ORAL | 0 refills | Status: DC

## 2019-06-05 MED ORDER — ONDANSETRON 4 MG PO TBDP: 4.0000 mg | ORAL_TABLET | Freq: Three times a day (TID) | ORAL | 0 refills | Status: DC | PRN

## 2019-06-05 MED ORDER — HYDROMORPHONE HCL 1 MG/ML IJ SOLN
1.0000 mg | Freq: Once | INTRAMUSCULAR | Status: AC
  Administered 2019-06-05: 1 mg via INTRAVENOUS
  Filled 2019-06-05: qty 1

## 2019-06-05 MED ORDER — ONDANSETRON HCL 4 MG/2ML IJ SOLN
4.0000 mg | Freq: Once | INTRAMUSCULAR | Status: AC
  Administered 2019-06-05: 4 mg via INTRAVENOUS
  Filled 2019-06-05: qty 2

## 2019-06-05 MED ORDER — MORPHINE SULFATE (PF) 4 MG/ML IV SOLN
4.0000 mg | Freq: Once | INTRAVENOUS | Status: AC
  Administered 2019-06-05: 4 mg via INTRAVENOUS
  Filled 2019-06-05: qty 1

## 2019-06-05 MED ORDER — OXYCODONE-ACETAMINOPHEN 5-325 MG PO TABS: 1.0000 | ORAL_TABLET | Freq: Four times a day (QID) | ORAL | 0 refills | Status: DC | PRN

## 2019-06-05 NOTE — ED Notes (Signed)
Pt ambulatory from triage 

## 2019-06-05 NOTE — ED Notes (Signed)
Pt ambulatory to CT

## 2019-06-05 NOTE — Discharge Instructions (Signed)
You were seen in the emergency department and found to have a kidney stone.  We are sending you home with multiple medications to assist with passing the stone:   -Flomax-this is a medication to help pass the stone, it allows urine to exit the body more freely.  Please take this once daily with a meal.  -Percocet-this is a narcotic/controlled substance medication that has potential addicting qualities.  We recommend that you take 1-2 tablets every 6 hours as needed for severe pain.  Do not drive or operate heavy machinery when taking this medicine as it can be sedating. Do not drink alcohol or take other sedating medications when taking this medicine for safety reasons.  Keep this out of reach of small children.  Please be aware this medicine has Tylenol in it (325 mg/tab) do not exceed the maximum dose of Tylenol in a day per over the counter recommendations should you decide to supplement with Tylenol over the counter.   -Zofran-this is an antinausea medication, you may take this every 8 hours as needed for nausea and vomiting, please allow the tablet to dissolve underneath of your tongue.   We have prescribed you new medication(s) today. Discuss the medications prescribed today with your pharmacist as they can have adverse effects and interactions with your other medicines including over the counter and prescribed medications. Seek medical evaluation if you start to experience new or abnormal symptoms after taking one of these medicines, seek care immediately if you start to experience difficulty breathing, feeling of your throat closing, facial swelling, or rash as these could be indications of a more serious allergic reaction  Your creatinine, a measure of kidney function, was mildly elevated, please have these rechecked at your follow-up appointment.  Your hemoglobin/hematocrit were a bit low consistent with anemia, please have this rechecked by your primary care provider within 1 week.  Please  follow-up with the urology group provided in your discharge instructions within 3 to 5 days.  Return to the ER for new or worsening symptoms including but not limited to worsening pain not controlled by these medicines, inability to keep fluids down, fever, or any other concerns that you may have.

## 2019-06-05 NOTE — ED Triage Notes (Signed)
Patient reports she was at home today reading a book on the sofa and developed a sharp, stabbing pain on right side of abdomen under breast, pain radiates around  Front of stomach and toward right flank. Pain is rated 7/10. Patient stated she googled this and google said it was her appendix. Pain started approx 30 mins ago. Patient says she also feels nauseated. Patient says pain has been constant for 30 mins with no relief.

## 2019-06-05 NOTE — ED Provider Notes (Signed)
[facility] DEPT Provider Note   CSN: [id number] Arrival date & time: 06/05/19  1709     History Chief Complaint  Patient presents with  . Flank Pain  . Abdominal Pain    Jasmine [name] is a 64 y.o. female with a hx of anemia, anxiety, depression, diverticulitis, prior ovarian cyst, GERD, sleep apnea, and hyperlipidemia with prior abdominal surgeries including bilateral something oophorectomy who presents to the emergency department with complaints of abdominal pain that began suddenly about 30 minutes prior to arrival.  She describes the pain as being to her right flank/right upper quadrant of her abdomen with some radiation into the central abdomen.  Discomfort is constant, severe, no specific alleviating or aggravating factors.  She reports associated nausea without vomiting.  No history of similar pain.  Denies fever, chills, emesis, dysuria, hematuria, hematochezia, melena, diarrhea, chest pain, or dyspnea.   HPI     Past Medical History:  Diagnosis Date  . Anemia   . Anxiety   . Arthritis   . Depression   . Diverticulitis   . Diverticulosis   . GERD (gastroesophageal reflux disease)    tums only PRN  . Ovarian cyst   . Prediabetes   . Sleep apnea    no CPAP    Patient Active Problem List   Diagnosis Date Noted  . Elevated transaminase level 06/05/2017  . Hyperlipidemia 06/05/2017  . Anemia, unspecified 06/05/2017  . GERD (gastroesophageal reflux disease) 06/05/2017  . OSA (obstructive sleep apnea) 06/05/2017  . Obesity with body mass index (BMI) of 30.0 to 39.9 06/05/2017  . Urinary incontinence 09/18/2012  . Diverticulitis of colon 07/30/2012  . Adjustment disorder with mixed anxiety and depressed mood 07/28/2012    Past Surgical History:  Procedure Laterality Date  . BLADDER SUSPENSION N/A 04/24/2013   Procedure: TRANSVAGINAL TAPE (TVT) Exact PROCEDURE;  Surgeon: Elveria Royals, MD;  Location: [facility];  Service:  Gynecology;  Laterality: N/A;  . CARPAL TUNNEL RELEASE Bilateral 1997  . COLONOSCOPY    . CYSTOSCOPY N/A 04/24/2013   Procedure: CYSTOSCOPY;  Surgeon: Elveria Royals, MD;  Location: [facility];  Service: Gynecology;  Laterality: N/A;  . GIVENS CAPSULE STUDY N/A 10/21/2017   Procedure: GIVENS CAPSULE STUDY;  Surgeon: Doran Stabler, MD;  Location: [facility];  Service: Gastroenterology;  Laterality: N/A;  . LAPAROSCOPY Bilateral 05/03/2014   Procedure: LAPAROSCOPIC BILATERAL SALPINGO OOPHORECTOMY;  Surgeon: Lyman Speller, MD;  Location: [facility];  Service: Gynecology;  Laterality: Bilateral;  . TOTAL KNEE ARTHROPLASTY Bilateral 04/2010     OB History    Gravida  3   Para  2   Term      Preterm      AB  1   Living  2     SAB      TAB      Ectopic      Multiple      Live Births              Family History  Problem Relation Age of Onset  . Multiple myeloma Mother   . Early death Mother   . Heart attack Father   . Alcohol abuse Father   . COPD Father   . Cancer Maternal Grandmother   . Alcohol abuse Maternal Grandfather   . Depression Maternal Grandfather   . Heart disease Paternal Grandmother   . Colon cancer Neg Hx   . Stomach cancer Neg Hx   .  Esophageal cancer Neg Hx   . Rectal cancer Neg Hx     Social History   Tobacco Use  . Smoking status: Former Smoker    Packs/day: 0.50    Types: Cigarettes    Quit date: 10/28/2016    Years since quitting: 2.6  . Smokeless tobacco: Never Used  . Tobacco comment: 1/2 PPD  Substance Use Topics  . Alcohol use: Not Currently  . Drug use: No    Home Medications Prior to Admission medications   Medication Sig Start Date End Date Taking? Authorizing Provider  ALPRAZolam Duanne Moron) 0.5 MG tablet Take 1 tablet by mouth at bedtime as needed for anxiety.  01/08/17  Yes [provider]  calcium carbonate (TUMS - DOSED IN MG ELEMENTAL CALCIUM) 500 MG chewable tablet Chew 2 tablets by mouth as needed for  indigestion or heartburn.   Yes [provider]  Calcium-Magnesium-Vitamin D (CALCIUM 1200+D3 PO) Take 1 tablet by mouth 2 (two) times daily.   Yes [provider]  docusate sodium (STOOL SOFTENER) 100 MG capsule Take 200 mg by mouth daily.   Yes [provider]  FIBER FORMULA CAPS Take 1 capsule by mouth 2 (two) times daily.    Yes [provider]  imipramine (TOFRANIL) 50 MG tablet Take 50 mg by mouth Daily. 02/20/11  Yes [provider]  PARoxetine (PAXIL) 40 MG tablet Take 40 mg by mouth daily.  09/24/18  Yes [provider]  amoxicillin-clavulanate (AUGMENTIN) 875-125 MG tablet Take 1 tablet by mouth 2 (two) times daily. Use if needed for diverticulitis flare Patient not taking: Reported on 06/05/2019 12/22/18   Copland, Joan Filler, MD    Allergies    Trintellix [vortioxetine]  Review of Systems   Review of Systems Constitutional: Negative for chills and fever.  Respiratory: Negative for shortness of breath.   Cardiovascular: Negative for chest pain.  Gastrointestinal: Positive for abdominal pain and nausea. Negative for blood in stool, constipation, diarrhea and vomiting.  Genitourinary: Positive for flank pain. Negative for decreased urine volume, dysuria, hematuria, urgency, vaginal bleeding and vaginal discharge.  All other systems reviewed and are negative. Physical Exam Updated Vital Signs BP (!) 152/77   Pulse 92   Temp 97.7 F (36.5 C) (Oral)   Resp 14   Ht '5\' 7"'  (1.702 m)   Wt 100.7 kg   LMP 01/28/2010   SpO2 95%   BMI 34.77 kg/m   Physical Exam Vitals and nursing note reviewed.  Constitutional:  General: She is not in acute distress. Appearance: She is well-developed. She is not toxic-appearing.  HENT:  Head: Normocephalic and atraumatic.  Eyes:  General:  Right eye: No discharge.  Left eye: No discharge.  Conjunctiva/sclera: Conjunctivae normal.  Cardiovascular:  Rate and Rhythm: Normal rate and regular  rhythm.  Pulmonary:  Effort: Pulmonary effort is normal. No respiratory distress.  Breath sounds: Normal breath sounds. No wheezing, rhonchi or rales.  Abdominal:  General: There is no distension.  Palpations: Abdomen is soft.  Tenderness: There is no abdominal tenderness. There is no right CVA tenderness, left CVA tenderness, guarding or rebound. Negative signs include Murphy's sign and McBurney's sign.  Musculoskeletal:  Cervical back: Neck supple.  Skin:  General: Skin is warm and dry.  Findings: No rash.  Neurological:  Mental Status: She is alert.  Comments: Clear speech.  Psychiatric:  Behavior: Behavior normal.   ED Results / Procedures / Treatments   Labs (all labs ordered are listed, but only abnormal results  are displayed) Labs Reviewed  COMPREHENSIVE METABOLIC PANEL - Abnormal; Notable for the following components:      Result Value   Glucose, Bld 130 (*)    Creatinine, Ser 1.27 (*)    GFR calc non Af Amer 45 (*)    GFR calc Af Amer 52 (*)    All other components within normal limits  CBC WITH DIFFERENTIAL/PLATELET - Abnormal; Notable for the following components:   Hemoglobin 10.5 (*)    HCT 34.4 (*)    MCV 78.2 (*)    MCH 23.9 (*)    RDW 17.5 (*)    All other components within normal limits  LIPASE, BLOOD  URINALYSIS, ROUTINE W REFLEX MICROSCOPIC    EKG None  Radiology CT Renal Stone Study  Result Date: 06/05/2019 CLINICAL DATA:  Right-sided abdominal pain. EXAM: CT ABDOMEN AND PELVIS WITHOUT CONTRAST TECHNIQUE: Multidetector CT imaging of the abdomen and pelvis was performed following the standard protocol without IV contrast. COMPARISON:  July 28, 2012 FINDINGS: Lower chest: No acute abnormality. Hepatobiliary: No focal liver abnormality is seen. No gallstones, gallbladder wall thickening, or biliary dilatation. Pancreas: Unremarkable. No pancreatic ductal dilatation or surrounding inflammatory changes. Spleen: Normal in size without focal abnormality.  Adrenals/Urinary Tract: Adrenal glands are unremarkable. Kidneys are normal in size without focal lesions. An ill-defined 2 mm obstructing renal stone is seen at the right UVJ (axial CT image 73, CT series number 2), with mild to moderate severity right-sided hydronephrosis and hydroureter. Bladder is unremarkable. Stomach/Bowel: Stomach is within normal limits. Appendix appears normal. No evidence of bowel wall thickening, distention, or inflammatory changes. Noninflamed diverticula are seen throughout the large bowel. Vascular/Lymphatic: Mild aortic atherosclerosis. No enlarged abdominal or pelvic lymph nodes. Reproductive: Uterus and bilateral adnexa are unremarkable. Other: No abdominal wall hernia or abnormality. No abdominopelvic ascites. Musculoskeletal: Mild degenerative changes seen within the lumbar spine. IMPRESSION: 1. Obstructing 2 mm stone at the right UVJ, with mild to moderate severity right-sided hydronephrosis and hydroureter. 2. Noninflamed diverticulosis. 3. Mild aortic atherosclerosis. Aortic Atherosclerosis (ICD10-I70.0). Electronically Signed   By: Virgina Norfolk M.D.   On: 06/05/2019 19:33    Procedures Procedures (including critical care time)  Medications Ordered in ED Medications  sodium chloride 0.9 % bolus 1,000 mL (1,000 mLs Intravenous New Bag/Given 06/05/19 1927)  ondansetron (ZOFRAN) injection 4 mg (4 mg Intravenous Given 06/05/19 1924)  morphine 4 MG/ML injection 4 mg (4 mg Intravenous Given 06/05/19 1924)  HYDROmorphone (DILAUDID) injection 1 mg (1 mg Intravenous Given 06/05/19 2005)    ED Course  I have reviewed the triage vital signs and the nursing notes.  Pertinent labs & imaging results that were available during my care of the patient were reviewed by me and considered in my medical decision making (see chart for details).  Clinical Course as of Jun 04 2110  Fri Jun 05, 2019  1946 CT Renal Laren Everts [SP]    Clinical Course User Index [SP] Calyx Hawker,  Glynda Jaeger, PA-C   MDM Rules/Calculators/A&P                      Patient presents to the ED with complaints of flank/abdominal pain which began fairly suddenly PTA. Patient nontoxic appearing, vitals without significant abnormalities- BP elevated, doubt HTN emergency. On exam patient's abdomen is nontender, no CVA tenderness, no peritoneal signs. DDX: nephrolithiasis, pyelonephritis/UTI, cholecystitis, pancreatitis, bowel obstruction/perforation, appendicitis, dissection, feel nephrolithiasis is most likely at this time will evaluate with labs and CT renal  study, analgesics, anti-emetics, and fluids ordered.   CBC: Mild anemia- PCP recheck. No leukocytosis.  CMP: Mildly elevated creatinine.  Lipase: WNL UA:  Hematuria, trace leuks and rare bacteria- send for culture, no urinary sxs.   CT renal study with 75m right UVJ stone with mild to moderate right-sided hydronephrosis and hydroureter.  Mild elevation in creatinine likely secondary to stone. Urinalysis is not overly concerning for superimposed infection, culture sent. Patient tolerating PO in the ER with pain well controlled. Will discharge home with Flomax, Zofran, and Percocet with urology follow up. [place]. I discussed results, treatment plan, need for urology follow-up, and return precautions with the patient. Provided opportunity for questions, patient confirmed understanding and is in agreement with plan.   Findings and plan of care discussed with supervising physician Dr. KWilson Singerwho is in agreement.   Final Clinical Impression(s) / ED Diagnoses Final diagnoses:  Kidney stone  Anemia, unspecified type    Rx / DC Orders ED Discharge Orders         Ordered    ondansetron (ZOFRAN ODT) 4 MG disintegrating tablet  Every 8 hours PRN     06/05/19 2122    oxyCODONE-acetaminophen (PERCOCET/ROXICET) 5-325 MG tablet  Every 6 hours PRN     06/05/19 2122    tamsulosin (FLOMAX) 0.4 MG  CAPS capsule  Daily after supper     06/05/19 2122           PLeafy Kindle02/05/21 2124    KVirgel Manifold MD 06/07/19 1258

## 2019-06-07 LAB — URINE CULTURE: Culture: NO GROWTH

## 2019-07-08 ENCOUNTER — Other Ambulatory Visit: Payer: Self-pay

## 2019-07-09 ENCOUNTER — Ambulatory Visit (INDEPENDENT_AMBULATORY_CARE_PROVIDER_SITE_OTHER): Payer: Commercial Managed Care - PPO | Admitting: Obstetrics & Gynecology

## 2019-07-09 ENCOUNTER — Encounter: Payer: Self-pay | Admitting: Obstetrics & Gynecology

## 2019-07-09 VITALS — BP 118/72 | HR 88 | Temp 96.8°F | Resp 12 | Ht 66.5 in | Wt 235.0 lb

## 2019-07-09 DIAGNOSIS — Z124 Encounter for screening for malignant neoplasm of cervix: Secondary | ICD-10-CM

## 2019-07-09 DIAGNOSIS — D509 Iron deficiency anemia, unspecified: Secondary | ICD-10-CM

## 2019-07-09 DIAGNOSIS — Z01419 Encounter for gynecological examination (general) (routine) without abnormal findings: Secondary | ICD-10-CM | POA: Diagnosis not present

## 2019-07-09 LAB — HEMOCCULT GUIAC POC 1CARD (OFFICE): Fecal Occult Blood, POC: NEGATIVE

## 2019-07-09 NOTE — Addendum Note (Signed)
Addended by: Megan Salon on: 07/09/2019 10:06 AM   Modules accepted: Orders

## 2019-07-09 NOTE — Progress Notes (Addendum)
64 y.o. [id number] Married White or Caucasian female here for annual exam.  Doing well.  Denies vaginal bleeding.  Has a second granddaughter.      Patient's last menstrual period was 01/28/2010.          Sexually active: No.  The current method of family planning is post menopausal status.    Exercising: Yes.    walking Smoker:  no  Health Maintenance: Pap:   03/07/18 Neg with neg HR HPV  11/07/15 Neg.              09/17/13 Neg. HR HPV:neg  History of abnormal Pap:  Yes, ASCUS 2013 MMG:  10/23/18 BIRADS 1 negative/density b Colonoscopy:  10/09/17 f/u 7 years BMD:   10/16/16 Normal TDaP:  2019 Pneumonia vaccine(s):  n/a Shingrix:   Completed Hep C testing: 2018 Neg Screening Labs: PCP   reports that she quit smoking about 2 years ago. Her smoking use included cigarettes. She smoked 0.50 packs per day. She has never used smokeless tobacco. She reports previous alcohol use. She reports that she does not use drugs.  Past Medical History:  Diagnosis Date  . Anemia   . Anxiety   . Arthritis   . Depression   . Diverticulitis   . Diverticulosis   . GERD (gastroesophageal reflux disease)    tums only PRN  . Kidney stone   . Ovarian cyst   . Prediabetes   . Sleep apnea    no CPAP    Past Surgical History:  Procedure Laterality Date  . BLADDER SUSPENSION N/A 04/24/2013   Procedure: TRANSVAGINAL TAPE (TVT) Exact PROCEDURE;  Surgeon: Elveria Royals, MD;  Location: [facility];  Service: Gynecology;  Laterality: N/A;  . CARPAL TUNNEL RELEASE Bilateral 1997  . CATARACT EXTRACTION, BILATERAL    . COLONOSCOPY    . CYSTOSCOPY N/A 04/24/2013   Procedure: CYSTOSCOPY;  Surgeon: Elveria Royals, MD;  Location: [facility];  Service: Gynecology;  Laterality: N/A;  . GIVENS CAPSULE STUDY N/A 10/21/2017   Procedure: GIVENS CAPSULE STUDY;  Surgeon: Doran Stabler, MD;  Location: [facility];  Service: Gastroenterology;  Laterality: N/A;  . LAPAROSCOPY Bilateral 05/03/2014   Procedure: LAPAROSCOPIC  BILATERAL SALPINGO OOPHORECTOMY;  Surgeon: Lyman Speller, MD;  Location: [facility];  Service: Gynecology;  Laterality: Bilateral;  . TOTAL KNEE ARTHROPLASTY Bilateral 04/2010    Current Outpatient Medications  Medication Sig Dispense Refill  . ALPRAZolam (XANAX) 0.5 MG tablet Take 1 tablet by mouth at bedtime as needed for anxiety.     . calcium carbonate (TUMS - DOSED IN MG ELEMENTAL CALCIUM) 500 MG chewable tablet Chew 2 tablets by mouth as needed for indigestion or heartburn.    . Calcium-Magnesium-Vitamin D (CALCIUM 1200+D3 PO) Take 1 tablet by mouth 2 (two) times daily.    Marland Kitchen docusate sodium (STOOL SOFTENER) 100 MG capsule Take 200 mg by mouth daily.    Marland Kitchen FIBER FORMULA CAPS Take 1 capsule by mouth 2 (two) times daily.     Marland Kitchen imipramine (TOFRANIL) 50 MG tablet Take 50 mg by mouth Daily.    Marland Kitchen PARoxetine (PAXIL) 40 MG tablet Take 40 mg by mouth daily.      No current facility-administered medications for this visit.    Family History  Problem Relation Age of Onset  . Multiple myeloma Mother   . Early death Mother   . Heart attack Father   . Alcohol abuse Father   . COPD Father   . Cancer  Maternal Grandmother   . Alcohol abuse Maternal Grandfather   . Depression Maternal Grandfather   . Heart disease Paternal Grandmother   . Colon cancer Neg Hx   . Stomach cancer Neg Hx   . Esophageal cancer Neg Hx   . Rectal cancer Neg Hx     Review of Systems  All other systems reviewed and are negative.   Exam:   BP 118/72 (BP Location: Right Arm, Patient Position: Sitting, Cuff Size: Large)   Pulse 88   Temp (!) 96.8 F (36 C) (Temporal)   Resp 12   Ht 5' 6.5" (1.689 m)   Wt 235 lb (106.6 kg)   LMP 01/28/2010   BMI 37.36 kg/m    Height: 5' 6.5" (168.9 cm)  Ht Readings from Last 3 Encounters:  07/09/19 5' 6.5" (1.689 m)  06/05/19 '5\' 7"'  (1.702 m)  12/22/18 '5\' 7"'  (1.702 m)   General appearance: alert, cooperative and appears stated age Head: Normocephalic, without obvious  abnormality, atraumatic Neck: no adenopathy, supple, symmetrical, trachea midline and thyroid normal to inspection and palpation Lungs: clear to auscultation bilaterally Breasts: normal appearance, no masses or tenderness Heart: regular rate and rhythm Abdomen: soft, non-tender; bowel sounds normal; no masses,  no organomegaly Extremities: extremities normal, atraumatic, no cyanosis or edema Skin: Skin color, texture, turgor normal. No rashes or lesions Lymph nodes: Cervical, supraclavicular, and axillary nodes normal. No abnormal inguinal nodes palpated Neurologic: Grossly normal   Pelvic: External genitalia:  no lesions              Urethra:  normal appearing urethra with no masses, tenderness or lesions              Bartholins and Skenes: normal                 Vagina: normal appearing vagina with normal color and discharge, no lesions              Cervix: no lesions              Pap taken: No. Bimanual Exam:  Uterus:  normal size, contour, position, consistency, mobility, non-tender              Adnexa: normal adnexa and no mass, fullness, tenderness               Rectovaginal: Confirms               Anus:  normal sphincter tone, no lesions  Chaperone, Terence Lux, CMA, was present for exam.  A:  Well Woman with normal exam PMP, no HRT H/o depression with recetn medication changes and side effects H/O SUI with mid urethral sling placed 12/14 H/o smoking.  Stopped 2018. H/o BSO 1/16 H/o iron deficiency anemia from GI ulcer.  Recent Hb 10.5 in ER. (eval with Ellouise Newer and was on iron and had endoscopy with colonoscopy)  P:   Mammogram guidelines reviewed pap smear neg with neg HR HPV 2019 CBC, iron studies today.  Will probably start iron will wait test results.  May need to start Zantac as well Colonoscopy UTD BMD UTD Guaiac negative today Vaccines UTD.  Knows to start pnemonia vaccination after birthday. Return annually or prn

## 2019-07-10 ENCOUNTER — Telehealth: Payer: Self-pay

## 2019-07-10 LAB — CBC
Hematocrit: 37.3 % (ref 34.0–46.6)
Hemoglobin: 10.9 g/dL — ABNORMAL LOW (ref 11.1–15.9)
MCH: 22.7 pg — ABNORMAL LOW (ref 26.6–33.0)
MCHC: 29.2 g/dL — ABNORMAL LOW (ref 31.5–35.7)
MCV: 78 fL — ABNORMAL LOW (ref 79–97)
Platelets: 393 10*3/uL (ref 150–450)
RBC: 4.8 x10E6/uL (ref 3.77–5.28)
RDW: 16.6 % — ABNORMAL HIGH (ref 11.7–15.4)
WBC: 6.7 10*3/uL (ref 3.4–10.8)

## 2019-07-10 LAB — IRON,TIBC AND FERRITIN PANEL
Ferritin: 16 ng/mL (ref 15–150)
Iron Saturation: 9 % — CL (ref 15–55)
Iron: 28 ug/dL (ref 27–139)
Total Iron Binding Capacity: 329 ug/dL (ref 250–450)
UIBC: 301 ug/dL (ref 118–369)

## 2019-07-10 NOTE — Addendum Note (Signed)
Addended by: Megan Salon on: 07/10/2019 01:46 PM   Modules accepted: Orders

## 2019-07-10 NOTE — Telephone Encounter (Signed)
-----   Message from Megan Salon, MD sent at 07/10/2019  1:46 PM EST ----- Please let pt know her HB was 10.9 so she does have some mild anemia.  Iron saturation is low, iron level is low and ferritin is decreased.  She should start oral iron sulfate 325mg  and try to take every day as long as it doesn't cause too much constipation.  She should take with OJ to increase absorption.  She should repeat CBC and iron studies in 3 months.  Orders placed.

## 2019-07-10 NOTE — Telephone Encounter (Signed)
Tried calling patient with results. No answer. Left message for patient to call me back at [phone].

## 2019-07-15 NOTE — Telephone Encounter (Signed)
Spoke to pt. Pt given lab results and recommendations. Pt agreeable and verbalized understanding. Pt states already takes stool softener and fiber supplement every day and will now take iron sulfate 325 mg with OJ every day. Pt scheduled for repeat fasting labs on 10/12/2019 at 8:30 am. Pt agreeable and knows to fast the night before.   Routing to Dr Sabra Heck for review and will close encounter.

## 2019-07-16 ENCOUNTER — Telehealth: Payer: Self-pay | Admitting: *Deleted

## 2019-07-16 NOTE — Telephone Encounter (Signed)
Pt given update on no fasting labs for repeat bloodwork on 10/12/2019. Pt agreeable and verbalized understanding.   Encounter closed.

## 2019-07-16 NOTE — Telephone Encounter (Signed)
Spoke to pt. Pt wanted to clarify about having her next BMD when she called [facility] to schedule her MMG. Pt update to date after reviewing AEX on 07/09/2019. Pt verbalized understanding.   Routing to Dr Sabra Heck for review and will close encounter.

## 2019-07-16 NOTE — Telephone Encounter (Signed)
Patient would like clarification on what Dr Sabra Heck discussed with her about bone density.

## 2019-07-16 NOTE — Telephone Encounter (Signed)
These are not fasting labs so she does not need to do anything special prior to the lab work.  Please just let her know.  Thanks.

## 2019-07-22 ENCOUNTER — Encounter: Payer: Self-pay | Admitting: Certified Nurse Midwife

## 2019-09-04 ENCOUNTER — Telehealth: Payer: Self-pay | Admitting: Gastroenterology

## 2019-09-04 NOTE — Telephone Encounter (Signed)
Pt has been experiencing loose stools recently and is very concerned. She just scheduled an appt on 5/25 with Dr. Loletha Carrow but would like some advise in the meantime or get a sooner appt. Pls call her.

## 2019-09-04 NOTE — Telephone Encounter (Signed)
Pt states that she is having some loose stools and her stool has been a green color. Pt is taking iron tabs, discussed with her the greenish color can be from different foods she has eaten or the iron pills. Discussed with pt that if her stools become black tarry stools then that could be a sign of blood in the stool. Pt states she has an appt with PCP on Monday and we could go ahead and cancel the appt with Dr. Loletha Carrow. She will call back if she thinks she needs to see GI.

## 2019-09-06 NOTE — Progress Notes (Signed)
[place] [facility] [place], [place], [place] [address] [phone]  Date:  09/07/2019   Name:  Jasmine [name]   DOB:  03/13/1956   MRN:  [id number]  PCP:  Darreld Mclean, MD    Chief Complaint: GI Problem (started taking iron march 15th, green tint in BM)   History of Present Illness:  Jasmine [name] is a 64 y.o. very pleasant female patient who presents with the following:  Patient with history of hyperlipidemia, anemia, sleep apnea, GERD Last seen by myself in August 2020 with diverticulitis  Here today with concern of "stomach problems" She tends to have periodic diverticulitis flares which she can usually manage at home She has noted some epigastric discomfort when she lays down at night She has noted this for about 3 weeks It seems to start in the epigastric area, and then spread throughout her upper abdomen. She is not having pain after meals No burping  No chest pain No unusual shortness of breath No vomiting  COVID-19 vaccine;  done Mammogram can be done anytime- this is scheduled  Pap is up-to-date Colonoscopy up-to-date Shingrix done Her gynecologist is Dr. Hale Bogus, she was seen earlier this year  She was noted to have anemia/ low iron by her GYN in [place] was asked to start taking oral iron This may have caused some bowel changes; her stools seem to have changed color and may sometimes be loose. She stopped taking it just a couple of days ago  No blood noted in her stool  She is taking over-the-counter Tums as needed only Patient Active Problem List   Diagnosis Date Noted  . Elevated transaminase level 06/05/2017  . Hyperlipidemia 06/05/2017  . Anemia, unspecified 06/05/2017  . GERD (gastroesophageal reflux disease) 06/05/2017  . OSA (obstructive sleep apnea) 06/05/2017  . Obesity with body mass index (BMI) of 30.0 to 39.9 06/05/2017  . Urinary incontinence 09/18/2012  .  Diverticulitis of colon 07/30/2012  . Adjustment disorder with mixed anxiety and depressed mood 07/28/2012    Past Medical History:  Diagnosis Date  . Anemia   . Anxiety   . Arthritis   . Depression   . Diverticulitis   . Diverticulosis   . GERD (gastroesophageal reflux disease)    tums only PRN  . Kidney stone   . Ovarian cyst   . Prediabetes   . Sleep apnea    no CPAP    Past Surgical History:  Procedure Laterality Date  . BLADDER SUSPENSION N/A 04/24/2013   Procedure: TRANSVAGINAL TAPE (TVT) Exact PROCEDURE;  Surgeon: Elveria Royals, MD;  Location: [facility];  Service: Gynecology;  Laterality: N/A;  . CARPAL TUNNEL RELEASE Bilateral 1997  . CATARACT EXTRACTION, BILATERAL    . COLONOSCOPY    . CYSTOSCOPY N/A 04/24/2013   Procedure: CYSTOSCOPY;  Surgeon: Elveria Royals, MD;  Location: [facility];  Service: Gynecology;  Laterality: N/A;  . GIVENS CAPSULE STUDY N/A 10/21/2017   Procedure: GIVENS CAPSULE STUDY;  Surgeon: Doran Stabler, MD;  Location: [facility];  Service: Gastroenterology;  Laterality: N/A;  . LAPAROSCOPY Bilateral 05/03/2014   Procedure: LAPAROSCOPIC BILATERAL SALPINGO OOPHORECTOMY;  Surgeon: Lyman Speller, MD;  Location: [facility];  Service: Gynecology;  Laterality: Bilateral;  . TOTAL KNEE ARTHROPLASTY Bilateral 04/2010    Social History   Tobacco Use  . Smoking status: Former Smoker    Packs/day: 0.50    Types: Cigarettes  Quit date: 10/28/2016    Years since quitting: 2.8  . Smokeless tobacco: Never Used  . Tobacco comment: 1/2 PPD  Substance Use Topics  . Alcohol use: Not Currently  . Drug use: No    Family History  Problem Relation Age of Onset  . Multiple myeloma Mother   . Early death Mother   . Heart attack Father   . Alcohol abuse Father   . COPD Father   . Cancer Maternal Grandmother   . Alcohol abuse Maternal Grandfather   . Depression Maternal Grandfather   . Heart disease Paternal Grandmother   . Colon cancer Neg Hx   .  Stomach cancer Neg Hx   . Esophageal cancer Neg Hx   . Rectal cancer Neg Hx     Allergies  Allergen Reactions  . Trintellix [Vortioxetine] Itching and Rash    Medication list has been reviewed and updated.  Current Outpatient Medications on File Prior to Visit  Medication Sig Dispense Refill  . ALPRAZolam (XANAX) 0.5 MG tablet Take 1 tablet by mouth at bedtime as needed for anxiety.     . calcium carbonate (TUMS - DOSED IN MG ELEMENTAL CALCIUM) 500 MG chewable tablet Chew 2 tablets by mouth as needed for indigestion or heartburn.    . Calcium-Magnesium-Vitamin D (CALCIUM 1200+D3 PO) Take 1 tablet by mouth 2 (two) times daily.    Marland Kitchen docusate sodium (STOOL SOFTENER) 100 MG capsule Take 200 mg by mouth daily.    . Ferrous Sulfate (IRON) 325 (65 Fe) MG TABS Take by mouth.    . FIBER FORMULA CAPS Take 1 capsule by mouth 2 (two) times daily.     Marland Kitchen imipramine (TOFRANIL) 50 MG tablet Take 50 mg by mouth Daily.    Marland Kitchen PARoxetine (PAXIL) 40 MG tablet Take 40 mg by mouth daily.      No current facility-administered medications on file prior to visit.    Review of Systems:  As per HPI- otherwise negative.   Physical Examination: Vitals:   09/07/19 1347 09/07/19 1410  BP: 138/82   Pulse: (!) 112 90  Resp: 18   Temp: (!) 97.3 F (36.3 C)   SpO2: 97%    Vitals:   09/07/19 1347  Weight: 232 lb (105.2 kg)  Height: 5' 6.5" (1.689 m)   Body mass index is 36.88 kg/m. Ideal Body Weight: Weight in (lb) to have BMI = 25: 156.9  GEN: no acute distress.  Overweight, looks well  HEENT: Atraumatic, Normocephalic.  Ears and Nose: No external deformity. CV: RRR, No M/G/R. No JVD. No thrill. No extra heart sounds. PULM: CTA B, no wheezes, crackles, rhonchi. No retractions. No resp. distress. No accessory muscle use. ABD: S, NT, ND, +BS. No rebound. No HSM.  Abdominal exam is benign, no guarding or rebound, negative Murphy sign EXTR: No c/c/e PSYCH: Normally interactive. Conversant.     Assessment and Plan: Hyperlipidemia, unspecified hyperlipidemia type - Plan: Lipid panel  Elevated transaminase level - Plan: Comprehensive metabolic panel  Screening for diabetes mellitus - Plan: CBC, Hemoglobin A1c  Iron deficiency anemia, unspecified iron deficiency anemia type - Plan: CBC  Screening for thyroid disorder - Plan: TSH  Iron deficiency - Plan: Ferritin  Epigastric pain - Plan: H. pylori breath test, pantoprazole (PROTONIX) 40 MG tablet, sucralfate (CARAFATE) 1 g tablet  Here today for a follow-up visit and discuss epigastric pain Her symptoms seem most consistent with gastritis, GERD, possible ulcer.  H. pylori pending, will start on Protonix and  Carafate Iron may also be contributing to her symptoms, will check her ferritin today and see if she can stop using this I will be in touch with her pending her labs  Moderate medical decision making today  This visit occurred during the SARS-CoV-2 public health emergency.  Safety protocols were in place, including screening questions prior to the visit, additional usage of staff PPE, and extensive cleaning of exam room while observing appropriate contact time as indicated for disinfecting solutions.    Signed Lamar Blinks, MD  Received her labs as below-letter to patient Mildly elevated alk phos has been intermittent, present for 10 years at least We will try to add on GGT Results for orders placed or performed in visit on 09/07/19  CBC  Result Value Ref Range   WBC 5.6 4.0 - 10.5 K/uL   RBC 4.99 3.87 - 5.11 Mil/uL   Platelets 297.0 150.0 - 400.0 K/uL   Hemoglobin 13.0 12.0 - 15.0 g/dL   HCT 40.5 36.0 - 46.0 %   MCV 81.2 78.0 - 100.0 fl   MCHC 32.1 30.0 - 36.0 g/dL   RDW 23.9 (H) 11.5 - 15.5 %  Comprehensive metabolic panel  Result Value Ref Range   Sodium 139 135 - 145 mEq/L   Potassium 4.2 3.5 - 5.1 mEq/L   Chloride 103 96 - 112 mEq/L   CO2 29 19 - 32 mEq/L   Glucose, Bld 115 (H) 70 - 99 mg/dL    BUN 16 6 - 23 mg/dL   Creatinine, Ser 0.88 0.40 - 1.20 mg/dL   Total Bilirubin 0.2 0.2 - 1.2 mg/dL   Alkaline Phosphatase 126 (H) 39 - 117 U/L   AST 15 0 - 37 U/L   ALT 17 0 - 35 U/L   Total Protein 6.6 6.0 - 8.3 g/dL   Albumin 3.8 3.5 - 5.2 g/dL   GFR 64.54 >60.00 mL/min   Calcium 9.3 8.4 - 10.5 mg/dL  Hemoglobin A1c  Result Value Ref Range   Hgb A1c MFr Bld 5.8 4.6 - 6.5 %  Lipid panel  Result Value Ref Range   Cholesterol 180 0 - 200 mg/dL   Triglycerides 125.0 0.0 - 149.0 mg/dL   HDL 57.20 >39.00 mg/dL   VLDL 25.0 0.0 - 40.0 mg/dL   LDL Cholesterol 98 0 - 99 mg/dL   Total CHOL/HDL Ratio 3    NonHDL 123.11   TSH  Result Value Ref Range   TSH 3.33 0.35 - 4.50 uIU/mL  Ferritin  Result Value Ref Range   Ferritin 32.0 10.0 - 291.0 ng/mL

## 2019-09-07 ENCOUNTER — Other Ambulatory Visit: Payer: Self-pay

## 2019-09-07 ENCOUNTER — Encounter: Payer: Self-pay | Admitting: Family Medicine

## 2019-09-07 ENCOUNTER — Ambulatory Visit (INDEPENDENT_AMBULATORY_CARE_PROVIDER_SITE_OTHER): Payer: Commercial Managed Care - PPO | Admitting: Family Medicine

## 2019-09-07 VITALS — BP 138/82 | HR 90 | Temp 97.3°F | Resp 18 | Ht 66.5 in | Wt 232.0 lb

## 2019-09-07 DIAGNOSIS — E785 Hyperlipidemia, unspecified: Secondary | ICD-10-CM | POA: Diagnosis not present

## 2019-09-07 DIAGNOSIS — E611 Iron deficiency: Secondary | ICD-10-CM | POA: Diagnosis not present

## 2019-09-07 DIAGNOSIS — R7401 Elevation of levels of liver transaminase levels: Secondary | ICD-10-CM | POA: Diagnosis not present

## 2019-09-07 DIAGNOSIS — Z1329 Encounter for screening for other suspected endocrine disorder: Secondary | ICD-10-CM

## 2019-09-07 DIAGNOSIS — D509 Iron deficiency anemia, unspecified: Secondary | ICD-10-CM | POA: Diagnosis not present

## 2019-09-07 DIAGNOSIS — Z131 Encounter for screening for diabetes mellitus: Secondary | ICD-10-CM | POA: Diagnosis not present

## 2019-09-07 DIAGNOSIS — R1013 Epigastric pain: Secondary | ICD-10-CM

## 2019-09-07 DIAGNOSIS — R7303 Prediabetes: Secondary | ICD-10-CM

## 2019-09-07 LAB — FERRITIN: Ferritin: 32 ng/mL (ref 10.0–291.0)

## 2019-09-07 LAB — LIPID PANEL
Cholesterol: 180 mg/dL (ref 0–200)
HDL: 57.2 mg/dL
LDL Cholesterol: 98 mg/dL (ref 0–99)
NonHDL: 123.11
Total CHOL/HDL Ratio: 3
Triglycerides: 125 mg/dL (ref 0.0–149.0)
VLDL: 25 mg/dL (ref 0.0–40.0)

## 2019-09-07 LAB — CBC
HCT: 40.5 % (ref 36.0–46.0)
Hemoglobin: 13 g/dL (ref 12.0–15.0)
MCHC: 32.1 g/dL (ref 30.0–36.0)
MCV: 81.2 fl (ref 78.0–100.0)
Platelets: 297 10*3/uL (ref 150.0–400.0)
RBC: 4.99 Mil/uL (ref 3.87–5.11)
RDW: 23.9 % — ABNORMAL HIGH (ref 11.5–15.5)
WBC: 5.6 10*3/uL (ref 4.0–10.5)

## 2019-09-07 LAB — COMPREHENSIVE METABOLIC PANEL WITH GFR
ALT: 17 U/L (ref 0–35)
AST: 15 U/L (ref 0–37)
Albumin: 3.8 g/dL (ref 3.5–5.2)
Alkaline Phosphatase: 126 U/L — ABNORMAL HIGH (ref 39–117)
BUN: 16 mg/dL (ref 6–23)
CO2: 29 meq/L (ref 19–32)
Calcium: 9.3 mg/dL (ref 8.4–10.5)
Chloride: 103 meq/L (ref 96–112)
Creatinine, Ser: 0.88 mg/dL (ref 0.40–1.20)
GFR: 64.54 mL/min
Glucose, Bld: 115 mg/dL — ABNORMAL HIGH (ref 70–99)
Potassium: 4.2 meq/L (ref 3.5–5.1)
Sodium: 139 meq/L (ref 135–145)
Total Bilirubin: 0.2 mg/dL (ref 0.2–1.2)
Total Protein: 6.6 g/dL (ref 6.0–8.3)

## 2019-09-07 LAB — TSH: TSH: 3.33 u[IU]/mL (ref 0.35–4.50)

## 2019-09-07 LAB — HEMOGLOBIN A1C: Hgb A1c MFr Bld: 5.8 % (ref 4.6–6.5)

## 2019-09-07 MED ORDER — SUCRALFATE 1 G PO TABS: 1.0000 g | ORAL_TABLET | Freq: Three times a day (TID) | ORAL | 0 refills | Status: DC

## 2019-09-07 MED ORDER — PANTOPRAZOLE SODIUM 40 MG PO TBEC: 40.0000 mg | DELAYED_RELEASE_TABLET | Freq: Every day | ORAL | 3 refills | Status: DC

## 2019-09-07 NOTE — Patient Instructions (Addendum)
It was good to see you today We will check on your iron level and see if you need to continue to take iron.  Perhaps we can stop using iron all together You might try doing some of your cooking in a cast iron pan-   this can help increase the iron content of your food without need for supplementation   Please start on pantoprazole 40 mg once a day, and Carafate with meals and at bedtime Use the Carafate for 10 days, the omeprazole you can use for 2 to 3 months as needed  Please let me know if this is helpful, I will be in touch with your H. pylori and other labs soon as possible

## 2019-09-08 ENCOUNTER — Other Ambulatory Visit (INDEPENDENT_AMBULATORY_CARE_PROVIDER_SITE_OTHER): Payer: Commercial Managed Care - PPO

## 2019-09-08 DIAGNOSIS — R748 Abnormal levels of other serum enzymes: Secondary | ICD-10-CM

## 2019-09-08 LAB — GAMMA GT: GGT: 22 U/L (ref 7–51)

## 2019-09-09 ENCOUNTER — Encounter: Payer: Self-pay | Admitting: Family Medicine

## 2019-09-09 ENCOUNTER — Other Ambulatory Visit: Payer: Self-pay

## 2019-09-09 ENCOUNTER — Other Ambulatory Visit: Payer: Commercial Managed Care - PPO

## 2019-09-09 DIAGNOSIS — R748 Abnormal levels of other serum enzymes: Secondary | ICD-10-CM

## 2019-09-09 DIAGNOSIS — R1013 Epigastric pain: Secondary | ICD-10-CM

## 2019-09-09 NOTE — Addendum Note (Signed)
Addended by: Caffie Pinto on: 09/09/2019 02:42 PM   Modules accepted: Orders

## 2019-09-10 LAB — H. PYLORI BREATH TEST: H. pylori Breath Test: NOT DETECTED

## 2019-09-10 NOTE — Progress Notes (Signed)
Received her H. pylori test, it is negative

## 2019-09-11 ENCOUNTER — Encounter: Payer: Self-pay | Admitting: Family Medicine

## 2019-09-18 ENCOUNTER — Encounter: Payer: Self-pay | Admitting: Family Medicine

## 2019-09-18 ENCOUNTER — Other Ambulatory Visit: Payer: Commercial Managed Care - PPO

## 2019-09-18 ENCOUNTER — Other Ambulatory Visit: Payer: Self-pay

## 2019-09-18 ENCOUNTER — Other Ambulatory Visit (INDEPENDENT_AMBULATORY_CARE_PROVIDER_SITE_OTHER): Payer: Commercial Managed Care - PPO

## 2019-09-18 DIAGNOSIS — R748 Abnormal levels of other serum enzymes: Secondary | ICD-10-CM

## 2019-09-18 LAB — ALKALINE PHOSPHATASE: Alkaline Phosphatase: 118 U/L — ABNORMAL HIGH (ref 39–117)

## 2019-09-18 NOTE — Addendum Note (Signed)
Addended by: Kelle Darting A on: 09/18/2019 09:06 AM   Modules accepted: Orders

## 2019-09-19 ENCOUNTER — Encounter: Payer: Self-pay | Admitting: Family Medicine

## 2019-09-22 ENCOUNTER — Ambulatory Visit: Payer: Commercial Managed Care - PPO | Admitting: Gastroenterology

## 2019-10-06 ENCOUNTER — Telehealth: Payer: Self-pay

## 2019-10-06 NOTE — Telephone Encounter (Signed)
Patient called regarding lab appointment for (10/12/19). Patient stated she had labs done at pcp office and would like to know if Dr. Sabra Heck can check her lab results. Patient stated she would like to know if so then can she cancel her lab appointment for our office.

## 2019-10-06 NOTE — Telephone Encounter (Signed)
Per review of 07/10/19 labs, patient to return for 3 mo repeat CBC and iron studies.   Labs repeated with PCP on 09/07/19 -results in [facility].   Call returned to patient. Advised ok to cancel labs on 10/12/19. Will provide update to Dr. Sabra Heck. Patient states PCP has reviewed labs with her, no need to return call unless any additional recommendations. Advised I will provide update to Dr. Sabra Heck and return call if any additional recommendations. Patient agreeable.   Future lab orders canceled for CBC and iron studies.   Routing to provider for final review. Patient is agreeable to disposition. Will close encounter.

## 2019-10-12 ENCOUNTER — Other Ambulatory Visit: Payer: Self-pay

## 2019-11-30 ENCOUNTER — Encounter: Payer: Self-pay | Admitting: Obstetrics & Gynecology

## 2019-12-02 ENCOUNTER — Encounter: Payer: Self-pay | Admitting: Family Medicine

## 2020-09-20 ENCOUNTER — Other Ambulatory Visit (HOSPITAL_COMMUNITY)
Admission: RE | Admit: 2020-09-20 | Discharge: 2020-09-20 | Disposition: A | Discharge status: Home / Self Care | Payer: Medicare Other | Source: Ambulatory Visit | Attending: Obstetrics & Gynecology | Admitting: Obstetrics & Gynecology

## 2020-09-20 ENCOUNTER — Other Ambulatory Visit: Payer: Self-pay

## 2020-09-20 ENCOUNTER — Encounter (HOSPITAL_BASED_OUTPATIENT_CLINIC_OR_DEPARTMENT_OTHER): Payer: Self-pay | Admitting: Obstetrics & Gynecology

## 2020-09-20 ENCOUNTER — Ambulatory Visit (INDEPENDENT_AMBULATORY_CARE_PROVIDER_SITE_OTHER): Payer: Medicare Other | Admitting: Obstetrics & Gynecology

## 2020-09-20 ENCOUNTER — Other Ambulatory Visit (HOSPITAL_BASED_OUTPATIENT_CLINIC_OR_DEPARTMENT_OTHER)
Admission: RE | Admit: 2020-09-20 | Discharge: 2020-09-20 | Disposition: A | Discharge status: Home / Self Care | Payer: Medicare Other | Source: Ambulatory Visit | Attending: Obstetrics & Gynecology | Admitting: Obstetrics & Gynecology

## 2020-09-20 VITALS — BP 137/90 | HR 95 | Ht 66.0 in | Wt 230.6 lb

## 2020-09-20 DIAGNOSIS — Z124 Encounter for screening for malignant neoplasm of cervix: Secondary | ICD-10-CM | POA: Insufficient documentation

## 2020-09-20 DIAGNOSIS — R8290 Unspecified abnormal findings in urine: Secondary | ICD-10-CM

## 2020-09-20 DIAGNOSIS — Z01419 Encounter for gynecological examination (general) (routine) without abnormal findings: Secondary | ICD-10-CM

## 2020-09-20 DIAGNOSIS — Z780 Asymptomatic menopausal state: Secondary | ICD-10-CM | POA: Diagnosis not present

## 2020-09-20 DIAGNOSIS — R350 Frequency of micturition: Secondary | ICD-10-CM

## 2020-09-20 DIAGNOSIS — Z87891 Personal history of nicotine dependence: Secondary | ICD-10-CM | POA: Diagnosis not present

## 2020-09-20 DIAGNOSIS — Z9079 Acquired absence of other genital organ(s): Secondary | ICD-10-CM

## 2020-09-20 DIAGNOSIS — Z90722 Acquired absence of ovaries, bilateral: Secondary | ICD-10-CM

## 2020-09-20 LAB — POCT URINALYSIS DIPSTICK
Appearance: NORMAL
Glucose, UA: NEGATIVE
Leukocytes, UA: NEGATIVE
Nitrite, UA: NEGATIVE
Protein, UA: POSITIVE — AB
Spec Grav, UA: >=1.03 — AB (ref 1.010–1.025)
Urobilinogen, UA: 0.2 E.U./dL
pH, UA: 6 (ref 5.0–8.0)

## 2020-09-20 NOTE — Progress Notes (Signed)
65 y.o. [id number] Married White or Caucasian female here for breast and pelvic exam.  Denies vaginal bleeding.  Still having good results from sling surgery.  Doesn't feel stream of urine is as strong as it was.  Would like urine tested today with change in hx.  Sister has Alzheimer's.  This is very sad for her.    Husband retired.  Son in good relationship.  Daughter married with two children.  Patient's last menstrual period was 01/28/2010.          Sexually active: No.  H/O STD:  no  Health Maintenance: PCP:  Dr. Lorelei Pont.  Last wellness appt was 08/2019.  Did blood work at that appt:  yes Vaccines are up to date:  yes Colonoscopy:  09/2017  MMG:  11/2019 BMD:  09/2016 normal Last pap smear:  03/07/18.   H/o abnormal pap smear:  H/o ASCUS pap    reports that she quit smoking about 3 years ago. Her smoking use included cigarettes. She smoked 0.50 packs per day. She has never used smokeless tobacco. She reports previous alcohol use. She reports that she does not use drugs.  Past Medical History:  Diagnosis Date  . Anemia   . Anxiety   . Arthritis   . Depression   . Diverticulosis   . GERD (gastroesophageal reflux disease)    tums only PRN  . History of diverticulitis   . Kidney stone   . Ovarian cyst   . Prediabetes   . Sleep apnea    no CPAP    Past Surgical History:  Procedure Laterality Date  . BLADDER SUSPENSION N/A 04/24/2013   Procedure: TRANSVAGINAL TAPE (TVT) Exact PROCEDURE;  Surgeon: Elveria Royals, MD;  Location: [facility];  Service: Gynecology;  Laterality: N/A;  . CARPAL TUNNEL RELEASE Bilateral 1997  . CATARACT EXTRACTION, BILATERAL    . COLONOSCOPY    . CYSTOSCOPY N/A 04/24/2013   Procedure: CYSTOSCOPY;  Surgeon: Elveria Royals, MD;  Location: [facility];  Service: Gynecology;  Laterality: N/A;  . GIVENS CAPSULE STUDY N/A 10/21/2017   Procedure: GIVENS CAPSULE STUDY;  Surgeon: Doran Stabler, MD;  Location: [facility];  Service: Gastroenterology;  Laterality:  N/A;  . LAPAROSCOPY Bilateral 05/03/2014   Procedure: LAPAROSCOPIC BILATERAL SALPINGO OOPHORECTOMY;  Surgeon: Lyman Speller, MD;  Location: [facility];  Service: Gynecology;  Laterality: Bilateral;  . TOTAL KNEE ARTHROPLASTY Bilateral 04/2010    Current Outpatient Medications  Medication Sig Dispense Refill  . ALPRAZolam (XANAX) 0.5 MG tablet Take 1 tablet by mouth at bedtime as needed for anxiety.     Marland Kitchen imipramine (TOFRANIL) 50 MG tablet Take 50 mg by mouth Daily.    Marland Kitchen PARoxetine (PAXIL) 40 MG tablet Take 40 mg by mouth daily.     . pantoprazole (PROTONIX) 40 MG tablet Take 1 tablet (40 mg total) by mouth daily. (Patient not taking: Reported on 09/20/2020) 30 tablet 3   No current facility-administered medications for this visit.    Family History  Problem Relation Age of Onset  . Multiple myeloma Mother   . Early death Mother   . Heart attack Father   . Alcohol abuse Father   . COPD Father   . Cancer Maternal Grandmother   . Alcohol abuse Maternal Grandfather   . Depression Maternal Grandfather   . Heart disease Paternal Grandmother   . Colon cancer Neg Hx   . Stomach cancer Neg Hx   . Esophageal cancer Neg Hx   .  Rectal cancer Neg Hx     Review of Systems  All other systems reviewed and are negative.   Exam:   BP 137/90 (BP Location: Right Arm, Patient Position: Sitting, Cuff Size: Large)   Pulse 95   Ht '5\' 6"'  (1.676 m)   Wt 230 lb 9.6 oz (104.6 kg)   LMP 01/28/2010   BMI 37.22 kg/m   Height: '5\' 6"'  (167.6 cm)  General appearance: alert, cooperative and appears stated age Breasts: normal appearance, no masses or tenderness Abdomen: soft, non-tender; bowel sounds normal; no masses,  no organomegaly Lymph nodes: Cervical, supraclavicular, and axillary nodes normal.  No abnormal inguinal nodes palpated Neurologic: Grossly normal  Pelvic: External genitalia:  no lesions              Urethra:  normal appearing urethra with no masses, tenderness or lesions               Bartholins and Skenes: normal                 Vagina: normal appearing vagina with atrophic changes and no discharge, no lesions              Cervix: no lesions              Pap taken: Yes.   Bimanual Exam:  Uterus:  normal size, contour, position, consistency, mobility, non-tender              Adnexa: normal adnexa and no mass, fullness, tenderness               Rectovaginal: Confirms               Anus:  normal sphincter tone, no lesions  Chaperone, Octaviano Batty, CMA, was present for exam.  Assessment/Plan: 1. Encntr for gyn exam (general) (routine) w/o abn findings - Pap smear with HR HPV obtained today - MMG 11/2019 - colonoscopy 09/2017 - BMD 6/18.  Plan to repeat next year - lab work done 08/2019 with Dr. Lorelei Pont - vaccines updated today  2. Postmenopausal - no HRT  3. Urinary frequency - POCT Urinalysis Dipstick  4. Abnormal urine odor - Urine Culture; Future  5. History of smoking - quit 2018  6. History of bilateral salpingo-oophorectomy (BSO) 1/16 - pathology with cystadenofibroma

## 2020-09-21 DIAGNOSIS — Z90722 Acquired absence of ovaries, bilateral: Secondary | ICD-10-CM | POA: Insufficient documentation

## 2020-09-21 DIAGNOSIS — Z780 Asymptomatic menopausal state: Secondary | ICD-10-CM | POA: Insufficient documentation

## 2020-09-22 LAB — URINE CULTURE: Culture: <10000 — AB

## 2020-09-22 LAB — CYTOLOGY - PAP: Diagnosis: NEGATIVE

## 2020-10-04 ENCOUNTER — Telehealth (HOSPITAL_BASED_OUTPATIENT_CLINIC_OR_DEPARTMENT_OTHER): Payer: Self-pay

## 2020-10-04 ENCOUNTER — Ambulatory Visit (INDEPENDENT_AMBULATORY_CARE_PROVIDER_SITE_OTHER): Payer: Medicare Other | Admitting: Obstetrics & Gynecology

## 2020-10-04 ENCOUNTER — Other Ambulatory Visit: Payer: Self-pay

## 2020-10-04 DIAGNOSIS — R350 Frequency of micturition: Secondary | ICD-10-CM

## 2020-10-04 LAB — POCT URINALYSIS DIPSTICK
Appearance: NORMAL
Bilirubin, UA: NEGATIVE
Glucose, UA: NEGATIVE
Ketones, UA: NEGATIVE
Leukocytes, UA: NEGATIVE
Nitrite, UA: NEGATIVE
Protein, UA: NEGATIVE
Spec Grav, UA: 1.02 (ref 1.010–1.025)
Urobilinogen, UA: 0.2 E.U./dL
pH, UA: 6.5 (ref 5.0–8.0)

## 2020-10-04 NOTE — Progress Notes (Signed)
Jasmine [name] is a 65 y.o. female here for a urine dipstick to check for protein.  Attestation of Attending Supervision of CMA/RN: Evaluation and management procedures were performed by the nurse under my supervision and collaboration.  I have reviewed the nursing note and chart, and I agree with the management and plan.  Carolyn L. Harraway-Smith, M.D., Cherlynn June

## 2020-10-04 NOTE — Telephone Encounter (Signed)
-----   Message from Megan Salon, MD sent at 10/04/2020 12:51 PM EDT ----- Please let pt know her repeat urine did not show protein.  No additional testing is needed at this time.  Thanks.

## 2020-10-04 NOTE — Telephone Encounter (Signed)
LM on the VM for the patient to call back re: message from Dr. Sabra Heck.

## 2020-10-07 NOTE — Progress Notes (Signed)
Pt here for u/a, performed by Prince Rome, CMA

## 2020-10-11 NOTE — Telephone Encounter (Signed)
Jasmine [name] is a 65 y.o. female called back re: documentation below. Jasmine [name] is a 65 y.o. female was contacted.  Pt verified using 2 identifiers. Confirmation that I am speaking with the correct person. Pt was notified of the message below. Pt had no additional questions.

## 2020-10-13 ENCOUNTER — Ambulatory Visit: Payer: Commercial Managed Care - PPO

## 2020-11-17 ENCOUNTER — Encounter (HOSPITAL_BASED_OUTPATIENT_CLINIC_OR_DEPARTMENT_OTHER): Payer: Self-pay | Admitting: Obstetrics & Gynecology

## 2020-12-01 ENCOUNTER — Encounter (HOSPITAL_BASED_OUTPATIENT_CLINIC_OR_DEPARTMENT_OTHER): Payer: Self-pay | Admitting: Obstetrics & Gynecology

## 2021-01-10 ENCOUNTER — Ambulatory Visit (INDEPENDENT_AMBULATORY_CARE_PROVIDER_SITE_OTHER): Payer: Medicare Other | Admitting: Family

## 2021-01-10 ENCOUNTER — Other Ambulatory Visit: Payer: Self-pay

## 2021-01-10 VITALS — BP 132/79 | HR 99 | Temp 98.7°F | Resp 16 | Wt 227.0 lb

## 2021-01-10 DIAGNOSIS — R52 Pain, unspecified: Secondary | ICD-10-CM | POA: Diagnosis not present

## 2021-01-10 DIAGNOSIS — B349 Viral infection, unspecified: Secondary | ICD-10-CM | POA: Diagnosis not present

## 2021-01-10 LAB — POCT INFLUENZA A/B
Influenza A, POC: NEGATIVE
Influenza B, POC: NEGATIVE

## 2021-01-10 NOTE — Assessment & Plan Note (Signed)
New. Rapid flu negative. Will send covid PCR swab.  Discussed supportive measures. Pt is advised to call if new/worsening symptoms or if symptoms are not improved in 3-4 days.

## 2021-01-10 NOTE — Progress Notes (Signed)
Subjective:   By signing my name below, I, Lyric Barr-McArthur, attest that this documentation has been prepared under the direction and in the presence of Debbrah Alar, NP, 01/10/2021   Patient ID: Jasmine [name], female    DOB: 11-19-1955, 65 y.o.   MRN: [id number]  Chief Complaint  Patient presents with   Dizziness    Complains of dizziness   Generalized Body Aches    Complains of body aches since Monday, had negative covid home test yesterday    HPI Patient is in today for an office visit.   Feeling unwell: She presents today with body aches, issues sleeping, and dizziness that began on Saturday 01/07/2021. She took a Covid-19 rapid at home test yesterday on 01/09/2021 and the results were negative.  Immunizations: She is interested in receiving her flu vaccine and the updated Covid-19 vaccine at her pharmacy once she is feeling better. Anxiety/Depression: She is interested in switching her anti-depressants due to the possibility that they are making her unwell physically.   Health Maintenance Due  Topic Date Due   PNA vac Low Risk Adult (1 of 2 - PCV13) Never done   COVID-19 Vaccine (3 - Booster for Moderna series) 01/07/2020   INFLUENZA VACCINE  11/28/2020    Past Medical History:  Diagnosis Date   Anemia    Anxiety    Arthritis    Depression    Diverticulosis    GERD (gastroesophageal reflux disease)    tums only PRN   History of diverticulitis    Kidney stone    Ovarian cyst    Prediabetes    Sleep apnea    no CPAP    Past Surgical History:  Procedure Laterality Date   BLADDER SUSPENSION N/A 04/24/2013   Procedure: TRANSVAGINAL TAPE (TVT) Exact PROCEDURE;  Surgeon: Elveria Royals, MD;  Location: [facility];  Service: Gynecology;  Laterality: N/A;   CARPAL TUNNEL RELEASE Bilateral 1997   CATARACT EXTRACTION, BILATERAL     COLONOSCOPY     CYSTOSCOPY N/A 04/24/2013   Procedure: CYSTOSCOPY;  Surgeon: Elveria Royals, MD;  Location: [facility];  Service:  Gynecology;  Laterality: N/A;   GIVENS CAPSULE STUDY N/A 10/21/2017   Procedure: GIVENS CAPSULE STUDY;  Surgeon: Doran Stabler, MD;  Location: [facility];  Service: Gastroenterology;  Laterality: N/A;   LAPAROSCOPY Bilateral 05/03/2014   Procedure: LAPAROSCOPIC BILATERAL SALPINGO OOPHORECTOMY;  Surgeon: Lyman Speller, MD;  Location: [facility];  Service: Gynecology;  Laterality: Bilateral;   TOTAL KNEE ARTHROPLASTY Bilateral 04/2010    Family History  Problem Relation Age of Onset   Multiple myeloma Mother    Early death Mother    Heart attack Father    Alcohol abuse Father    COPD Father    Cancer Maternal Grandmother    Alcohol abuse Maternal Grandfather    Depression Maternal Grandfather    Heart disease Paternal Grandmother    Colon cancer Neg Hx    Stomach cancer Neg Hx    Esophageal cancer Neg Hx    Rectal cancer Neg Hx     Social History   Socioeconomic History   Marital status: Married    Spouse name: Not on file   Number of children: 2   Years of education: Not on file   Highest education level: Not on file  Occupational History   Not on file  Tobacco Use   Smoking status: Former    Packs/day: 0.50    Types: Cigarettes  Quit date: 10/28/2016    Years since quitting: 4.2   Smokeless tobacco: Never   Tobacco comments:    1/2 PPD  Vaping Use   Vaping Use: Never used  Substance and Sexual Activity   Alcohol use: Not Currently   Drug use: No   Sexual activity: Not Currently    Partners: Male    Birth control/protection: Abstinence  Other Topics Concern   Not on file  Social History Narrative   Not on file   Social Determinants of Health   Financial Resource Strain: Not on file  Food Insecurity: Not on file  Transportation Needs: Not on file  Physical Activity: Not on file  Stress: Not on file  Social Connections: Not on file  Intimate Partner Violence: Not on file    Outpatient Medications Prior to Visit  Medication Sig Dispense Refill    ALPRAZolam (XANAX) 0.5 MG tablet Take 1 tablet by mouth at bedtime as needed for anxiety.      imipramine (TOFRANIL) 50 MG tablet Take 50 mg by mouth Daily.     pantoprazole (PROTONIX) 40 MG tablet Take 1 tablet (40 mg total) by mouth daily. 30 tablet 3   PARoxetine (PAXIL) 40 MG tablet Take 40 mg by mouth daily.      No facility-administered medications prior to visit.    Allergies  Allergen Reactions   Trintellix [Vortioxetine] Itching and Rash    Review of Systems  Constitutional:        (+) difficulty falling asleep   Musculoskeletal:        (+) body aches  Neurological:  Positive for dizziness.      Objective:    Physical Exam Constitutional:      General: She is not in acute distress.    Appearance: Normal appearance. She is not ill-appearing.  HENT:     Head: Normocephalic and atraumatic.     Right Ear: Tympanic membrane, ear canal and external ear normal.     Left Ear: Tympanic membrane, ear canal and external ear normal.  Eyes:     Extraocular Movements: Extraocular movements intact.     Pupils: Pupils are equal, round, and reactive to light.  Cardiovascular:     Rate and Rhythm: Normal rate and regular rhythm.     Heart sounds: Normal heart sounds. No murmur heard.   No gallop.  Pulmonary:     Effort: Pulmonary effort is normal. No respiratory distress.     Breath sounds: Normal breath sounds. No wheezing or rales.  Lymphadenopathy:     Cervical: No cervical adenopathy.  Skin:    General: Skin is warm and dry.  Neurological:     Mental Status: She is alert and oriented to person, place, and time.  Psychiatric:        Behavior: Behavior normal.        Judgment: Judgment normal.    BP 132/79 (BP Location: Right Arm, Patient Position: Sitting, Cuff Size: Large)   Pulse 99   Temp 98.7 F (37.1 C) (Temporal)   Resp 16   Wt 227 lb (103 kg)   LMP 01/28/2010   SpO2 98%   BMI 36.64 kg/m  Wt Readings from Last 3 Encounters:  01/10/21 227 lb (103 kg)   09/20/20 230 lb 9.6 oz (104.6 kg)  09/07/19 232 lb (105.2 kg)       Assessment & Plan:   Problem List Items Addressed This Visit       Unprioritized   Viral illness -  Primary    New. Rapid flu negative. Will send covid PCR swab.  Discussed supportive measures. Pt is advised to call if new/worsening symptoms or if symptoms are not improved in 3-4 days.       Other Visit Diagnoses     Generalized body aches       Relevant Orders   POCT Influenza A/B (Completed)   SARS-COV-2 RNA,(COVID-19) QUAL NAAT       No orders of the defined types were placed in this encounter.  21 minutes spent on patient's visit. Time was spent counseling and examining patient.   I, Debbrah Alar, NP, personally preformed the services described in this documentation.  All medical record entries made by the scribe were at my direction and in my presence.  I have reviewed the chart and discharge instructions (if applicable) and agree that the record reflects my personal performance and is accurate and complete. 01/10/2021  I,Lyric Barr-McArthur,acting as a Education administrator for Nance Pear, NP.,have documented all relevant documentation on the behalf of Nance Pear, NP,as directed by  Nance Pear, NP while in the presence of Nance Pear, NP.    Nance Pear, NP

## 2021-01-10 NOTE — Addendum Note (Signed)
Addended by: Jiles Prows on: 01/10/2021 06:23 PM   Modules accepted: Orders

## 2021-01-11 ENCOUNTER — Telehealth: Payer: Self-pay

## 2021-01-11 NOTE — Telephone Encounter (Signed)
Pt called in wanted to know covid pcr result but it was not back yet.

## 2021-01-12 LAB — NOVEL CORONAVIRUS, NAA: SARS-CoV-2, NAA: NOT DETECTED

## 2021-01-12 LAB — SARS-COV-2, NAA 2 DAY TAT

## 2021-01-18 NOTE — Telephone Encounter (Signed)
Pt called again today wanting to know her Covid and flu test results as she is having issues logging into her Mychart.  I advised both tests were negative.

## 2021-03-09 ENCOUNTER — Ambulatory Visit (INDEPENDENT_AMBULATORY_CARE_PROVIDER_SITE_OTHER): Payer: Medicare Other

## 2021-03-09 ENCOUNTER — Other Ambulatory Visit (HOSPITAL_BASED_OUTPATIENT_CLINIC_OR_DEPARTMENT_OTHER): Payer: Self-pay | Admitting: Obstetrics & Gynecology

## 2021-03-09 ENCOUNTER — Other Ambulatory Visit: Payer: Self-pay

## 2021-03-09 DIAGNOSIS — R350 Frequency of micturition: Secondary | ICD-10-CM

## 2021-03-09 LAB — POCT URINALYSIS DIPSTICK
Glucose, UA: NEGATIVE
Ketones, UA: 15
Nitrite, UA: NEGATIVE
Protein, UA: POSITIVE — AB
Spec Grav, UA: >=1.03 — AB (ref 1.010–1.025)
Urobilinogen, UA: 1 E.U./dL
pH, UA: 5.5 (ref 5.0–8.0)

## 2021-03-09 MED ORDER — SULFAMETHOXAZOLE-TRIMETHOPRIM 800-160 MG PO TABS: 1.0000 | ORAL_TABLET | Freq: Two times a day (BID) | ORAL | 0 refills | Status: DC

## 2021-03-09 NOTE — Progress Notes (Signed)
Pt here with complaints of burning with urination, urgency, and feeling as though she is not emptying her bladder. POCT performed. Urine to be sent for culture. Advised that Rx would be sent to pharmacy.

## 2021-03-13 ENCOUNTER — Telehealth: Payer: Self-pay | Admitting: Family Medicine

## 2021-03-13 NOTE — Telephone Encounter (Signed)
Pt stated she has been having dizziness for the past week. Transferred pt to Triage.

## 2021-03-14 ENCOUNTER — Ambulatory Visit: Payer: PRIVATE HEALTH INSURANCE | Admitting: Family

## 2021-03-14 ENCOUNTER — Ambulatory Visit (INDEPENDENT_AMBULATORY_CARE_PROVIDER_SITE_OTHER): Payer: Medicare Other | Admitting: Family Medicine

## 2021-03-14 ENCOUNTER — Encounter: Payer: Self-pay | Admitting: Family Medicine

## 2021-03-14 ENCOUNTER — Other Ambulatory Visit: Payer: Self-pay

## 2021-03-14 VITALS — BP 118/82 | HR 84 | Temp 97.8°F | Ht 67.0 in | Wt 227.0 lb

## 2021-03-14 DIAGNOSIS — R42 Dizziness and giddiness: Secondary | ICD-10-CM

## 2021-03-14 LAB — COMPREHENSIVE METABOLIC PANEL
ALT: 15 U/L (ref 0–35)
AST: 15 U/L (ref 0–37)
Albumin: 4.2 g/dL (ref 3.5–5.2)
Alkaline Phosphatase: 138 U/L — ABNORMAL HIGH (ref 39–117)
BUN: 21 mg/dL (ref 6–23)
CO2: 26 mEq/L (ref 19–32)
Calcium: 9.3 mg/dL (ref 8.4–10.5)
Chloride: 102 mEq/L (ref 96–112)
Creatinine, Ser: 1.06 mg/dL (ref 0.40–1.20)
GFR: 54.83 mL/min — ABNORMAL LOW (ref >60.00)
Glucose, Bld: 96 mg/dL (ref 70–99)
Potassium: 4.3 mEq/L (ref 3.5–5.1)
Sodium: 136 mEq/L (ref 135–145)
Total Bilirubin: 0.6 mg/dL (ref 0.2–1.2)
Total Protein: 7.1 g/dL (ref 6.0–8.3)

## 2021-03-14 LAB — CBC
HCT: 38.4 % (ref 36.0–46.0)
Hemoglobin: 12.1 g/dL (ref 12.0–15.0)
MCHC: 31.5 g/dL (ref 30.0–36.0)
MCV: 81 fl (ref 78.0–100.0)
Platelets: 392 10*3/uL (ref 150.0–400.0)
RBC: 4.74 Mil/uL (ref 3.87–5.11)
RDW: 15.5 % (ref 11.5–15.5)
WBC: 6.4 10*3/uL (ref 4.0–10.5)

## 2021-03-14 NOTE — Patient Instructions (Addendum)
Give [name] 2-3 business days to get the results of your labs back.   Stay hydrated. Try to get closer to 50-55 oz of water daily.   Let us know if you need anything.

## 2021-03-14 NOTE — Progress Notes (Addendum)
Chief Complaint  Patient presents with   Dizziness    For a couple weeks     Jasmine [name] is 66 y.o. pt here for dizziness.  Duration: 2 weeks Pass out? No Lasts for up to a minute.  When she stands up, going up steps, laying down can exacerbate it.  Spinning? Yes Recent illness/fever? No Headache? Not routinely Neurologic signs? No Change in PO intake? No; drinks around 30 oz of fluid daily.  SOB? No Palpitations? No Bleeding? No  Past Medical History:  Diagnosis Date   Anemia    Anxiety    Arthritis    Depression    Diverticulosis    GERD (gastroesophageal reflux disease)    tums only PRN   History of diverticulitis    Kidney stone    Ovarian cyst    Prediabetes    Sleep apnea    no CPAP    Family History  Problem Relation Age of Onset   Multiple myeloma Mother    Early death Mother    Heart attack Father    Alcohol abuse Father    COPD Father    Cancer Maternal Grandmother    Alcohol abuse Maternal Grandfather    Depression Maternal Grandfather    Heart disease Paternal Grandmother    Colon cancer Neg Hx    Stomach cancer Neg Hx    Esophageal cancer Neg Hx    Rectal cancer Neg Hx     Allergies as of 03/14/2021       Reactions   Trintellix [vortioxetine] Itching, Rash        Medication List        Accurate as of March 14, 2021 11:07 AM. If you have any questions, ask your nurse or doctor.          STOP taking these medications    sulfamethoxazole-trimethoprim 800-160 MG tablet Commonly known as: BACTRIM DS Stopped by: Shelda Pal, DO       TAKE these medications    ALPRAZolam 0.5 MG tablet Commonly known as: XANAX Take 1 tablet by mouth at bedtime as needed for anxiety.   imipramine 50 MG tablet Commonly known as: TOFRANIL Take 50 mg by mouth Daily.   pantoprazole 40 MG tablet Commonly known as: PROTONIX Take 1 tablet (40 mg total) by mouth daily.   PARoxetine 40 MG tablet Commonly known as:  PAXIL Take 40 mg by mouth daily.        BP 118/82   Pulse 84   Temp 97.8 F (36.6 C) (Oral)   Ht _0  (1.702 m)   Wt 227 lb (103 kg)   LMP 01/28/2010   SpO2 97%   BMI 35.55 kg/m  General: Awake, alert, appears stated age Eyes: PERRLA, EOMi Ears: Patent, TM's neg b/l Mouth: No lesions noted, MMD Heart: RRR, no murmurs, no carotid bruits Lungs: CTAB, no accessory muscle use MSK: 5/5 strength throughout, gait normal Neuro: No cerebellar signs, patellar reflex 2/4 b/l wo clonus, calcaneal reflex 0/4 b/l wo clonus, biceps reflex 1/4 b/l wo clonus; Dix-Hall-Pike negative b/l. Psych: Age appropriate judgment and insight, normal mood and affect  Dizziness - Plan: CBC, Comprehensive metabolic panel  New problem, uncertain prog, workup planned. Ck labs. Increase hydration. If no improvement over next couple weeks, will consider brain imaging.  Central process unlikely as well as infection, arrhythmia, carotid issue.  F/u prn. Pt voiced understanding and agreement to the plan.  [place], DO 03/14/21 11:07 AM

## 2021-03-14 NOTE — Telephone Encounter (Signed)
Patient has appointment with Dr. Nani Ravens today (03/14/2021).   [facility] - ClientTELEPHONE ADVICE RECORDAccessNurse PatientName:Jasmine [name] Gender: Female DOB: Feb 10, 1956 Age: 65 Y 3 M 7 D ReturnPhoneNumber:[phone](Primary) [facility] - Client Client Site [facility] - Day Physician Copland, Janett Billow- MD Contact Type Call Who Is Calling Patient / Member / Family / Caregiver Call Type Triage / Clinical Relationship To Patient Self Return Phone Number [phone] (Primary) Chief Complaint Dizziness Reason for Call Symptomatic / Request for [place] states she has been having some dizziness the last week and it has been happening when she is laying down and has noticed now that it happens when she is sitting and goes to stand up. Translation No Nurse Assessment Nurse: Files, RN, Dustin Date/Time (Eastern Time): 03/13/2021 4:57:34 PM Confirm and document reason for call. If symptomatic, describe symptoms. ---Caller states she started having dizziness when you laydown, you been having to stand up slowly because of it, no sob , no chest pain, no headaches Does the patient have any new or worsening symptoms? ---Yes Will a triage be completed? ---Yes Related visit to physician within the last 2 weeks? ---No Does the PT have any chronic conditions? (i.e. diabetes, asthma, this includes High risk factors for pregnancy, etc.) ---No Is this a behavioral health or substance abuse call? ---No Guidelines Guideline Title Affirmed Question Affirmed Notes Nurse Date/Time (Eastern Time) Dizziness - Lightheadedness [1] Dizziness caused by heat exposure, sudden standing, or poor fluid intake AND [2] no improvement after 2 hours of rest and fluids Files, RN, Dustin 03/13/2021 4:59:04 PM Disp. Time Eilene Ghazi Time) Disposition Final User PLEASE NOTE: All timestamps  contained within this report are represented as [place] Standard Time. CONFIDENTIALTY NOTICE: This fax transmission is intended only for the addressee. It contains information that is legally privileged, confidential or otherwise protected from use or disclosure. If you are not the intended recipient, you are strictly prohibited from reviewing, disclosing, copying using or disseminating any of this information or taking any action in reliance on or regarding this information. If you have received this fax in error, please notify us immediately by telephone so that we can arrange for its return to [name]. Phone: [phone], Toll-Free: [phone], Fax: [phone] Page: 2 of 2 Call Id: [id number] 03/13/2021 5:11:47 PM See HCP within 4 Hours (or PCP triage) Yes Files, RN, York Grice Disagree/Comply Disagree Caller Understands Yes PreDisposition InappropriateToAsk Care Advice Given Per Guideline SEE HCP (OR PCP TRIAGE) WITHIN 4 HOURS: * IF OFFICE WILL BE CLOSED AND NO PCP (PRIMARY CARE PROVIDER) SECOND-LEVEL TRIAGE: You need to be seen within the next 3 or 4 hours. A nearby Urgent Care Center [facility]) is often a good source of care. Another choice is to go to the ED. Go sooner if you become worse. CARE ADVICE given per Dizziness (Adult) guideline. Comments User: Aquilla Solian, RN Date/Time Eilene Ghazi Time): 03/13/2021 5:11:05 PM Transfered calling to backline to set up an appoitment per request, caller stated she isnt going to a UC today and was wanting to just try and get a appoitment User: Aquilla Solian, RN Date/Time Eilene Ghazi Time): 03/13/2021 5:11:35 PM Caller did state if she became worst she would call 911 Referrals REFERRED TO PCP OFFIC

## 2021-03-15 ENCOUNTER — Other Ambulatory Visit: Payer: Medicare Other

## 2021-03-15 DIAGNOSIS — R42 Dizziness and giddiness: Secondary | ICD-10-CM

## 2021-03-16 LAB — URINE CULTURE

## 2021-03-19 LAB — ALKALINE PHOSPHATASE ISOENZYMES
Alkaline phosphatase (APISO): 136 U/L (ref 37–153)
Bone Isoenzymes: 25 % — ABNORMAL LOW (ref 28–66)
Intestinal Isoenzymes: 0 % — ABNORMAL LOW (ref 1–24)
Liver Isoenzymes: 75 % — ABNORMAL HIGH (ref 25–69)
Macrohepatic isoenzymes: 0 % (ref <=0)
Placental isoenzymes: 0 % (ref <=0)

## 2021-05-06 IMAGING — CT CT RENAL STONE PROTOCOL
2 of 5 series · 17 of 46 positions shown, 19 images · non-contrast
Comparison: July 28, 2012

CLINICAL DATA: Right-sided abdominal pain.

EXAM:
CT ABDOMEN AND PELVIS WITHOUT CONTRAST
TECHNIQUE: Multidetector CT imaging of the abdomen and pelvis was performed
following the standard protocol without IV contrast.

[Series 2: axial st · axial · 0.87mm/px · z∈[+1152,+1527]mm · 14 of 87 slices shown, 16 images]
[im 6/87  soft-tissue]
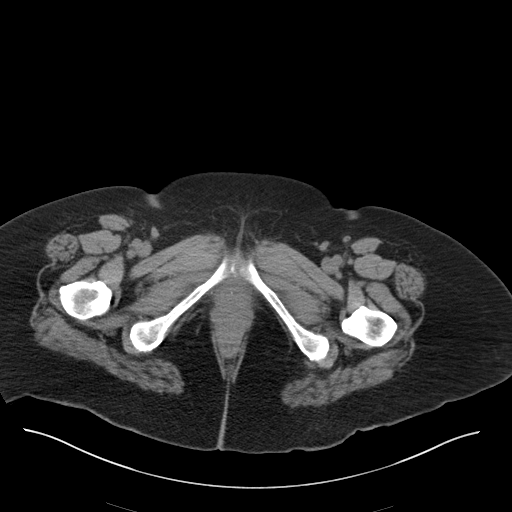
[im 6/87  bone]
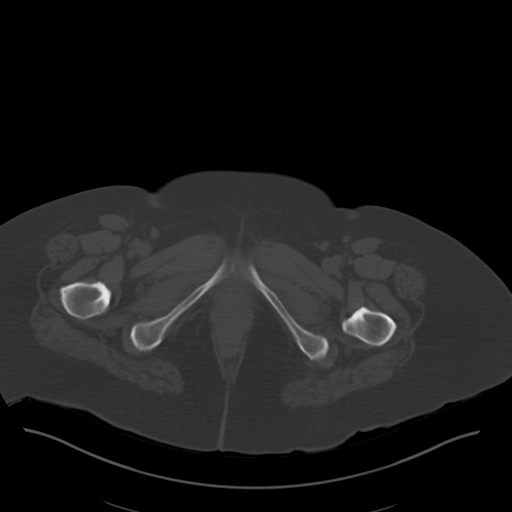
[im 11/87  soft-tissue]
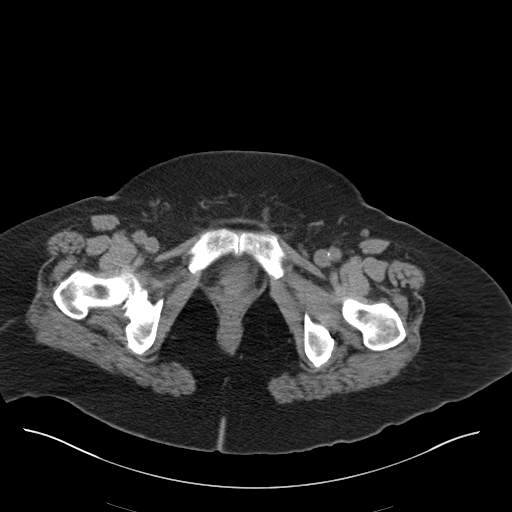
[im 16/87  soft-tissue]
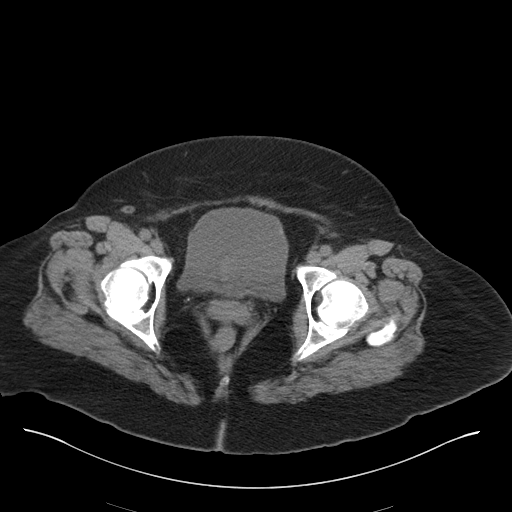
[im 26/87  soft-tissue]
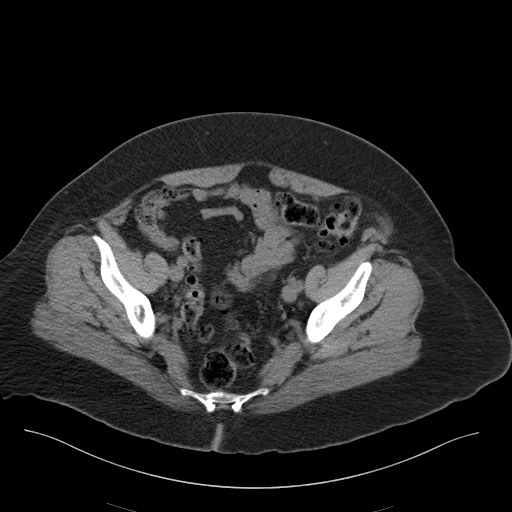
[im 31/87  soft-tissue]
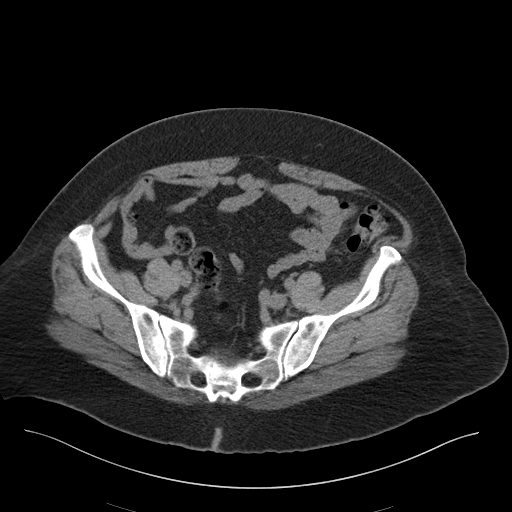
[im 36/87  soft-tissue]
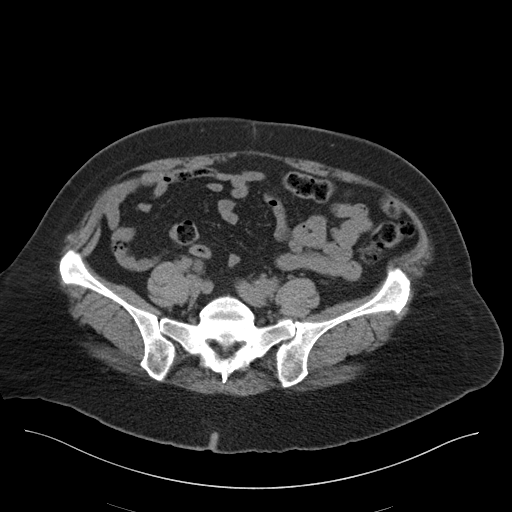
[im 41/87  soft-tissue]
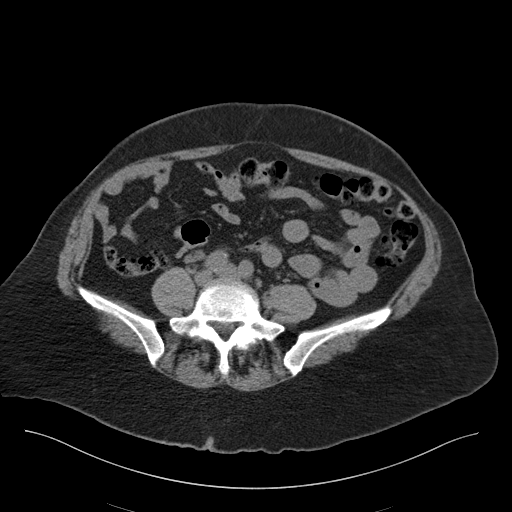
[im 46/87  soft-tissue]
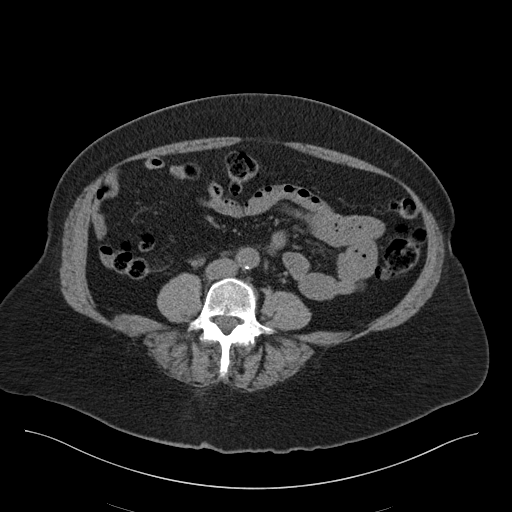
[im 51/87  soft-tissue]
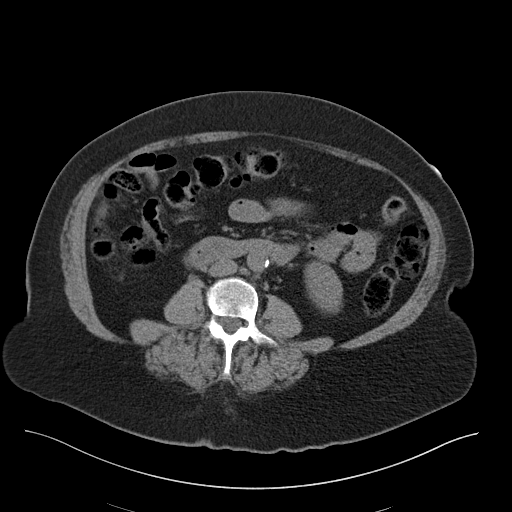
[im 51/87  bone]
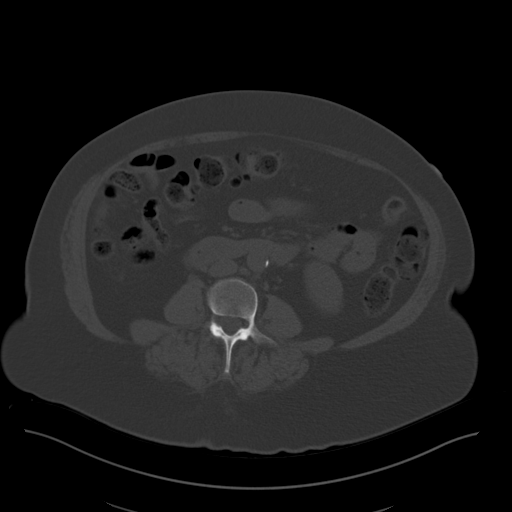
[im 56/87  soft-tissue]
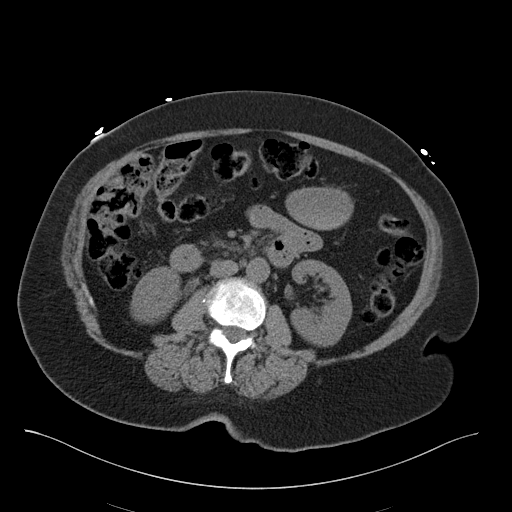
[im 66/87  soft-tissue]
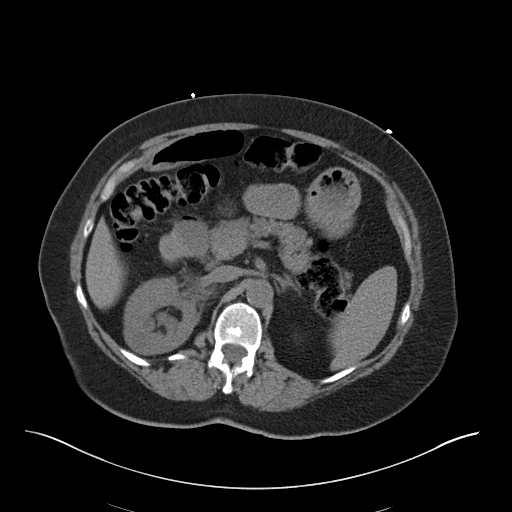
[im 71/87  soft-tissue]
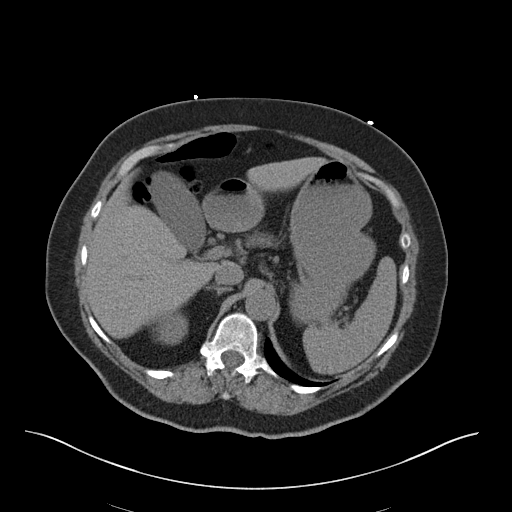
[im 76/87  soft-tissue]
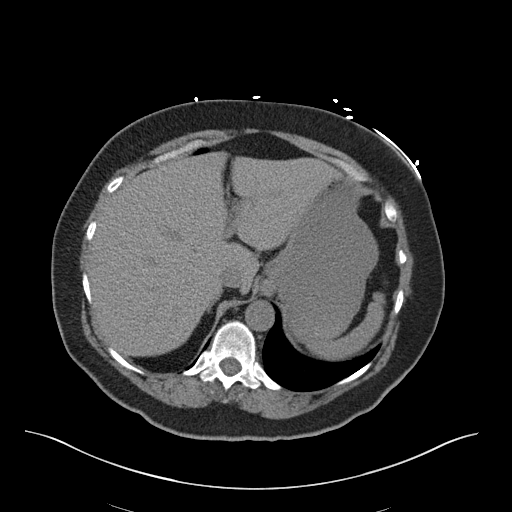
[im 81/87  soft-tissue]
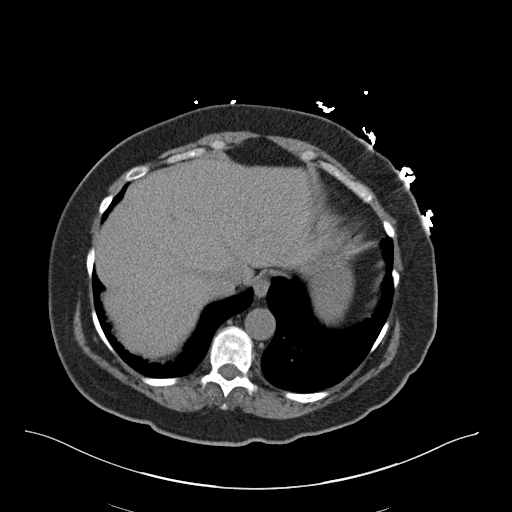

[Series 5: coronal · coronal · 0.82mm/px · 3 of 160 slices shown]
[im 54/160  soft-tissue]
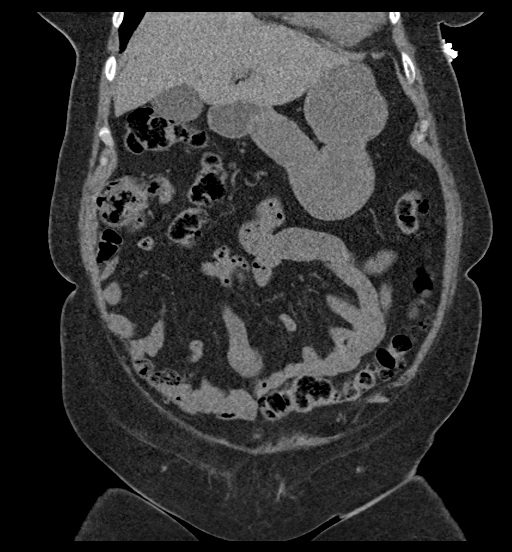
[im 71/160  soft-tissue]
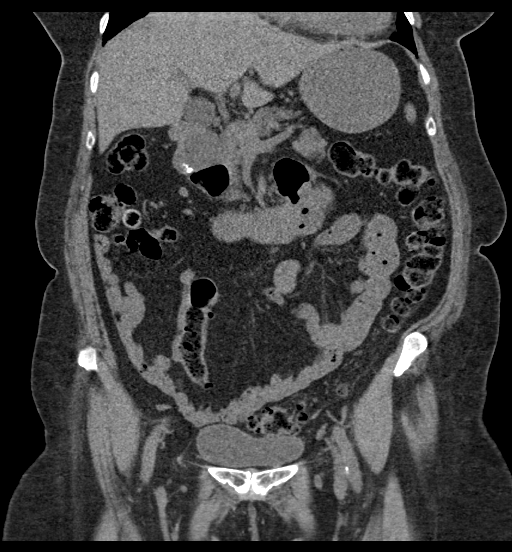
[im 89/160  soft-tissue]
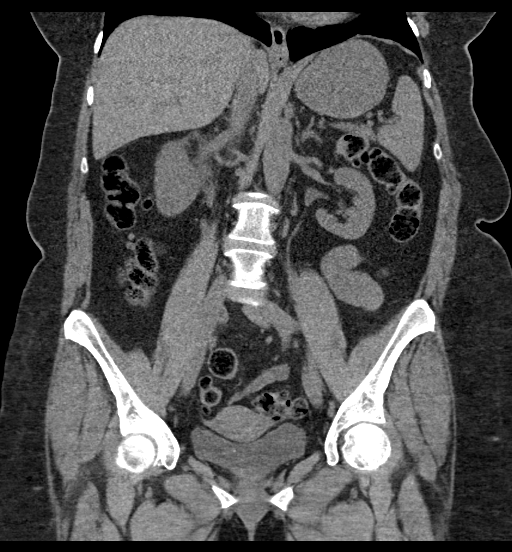

[17 of 46 positions shown; findings below may reference images not displayed]

FINDINGS: Lower chest: No acute abnormality.

Hepatobiliary: No focal liver abnormality is seen. No gallstones,
gallbladder wall thickening, or biliary dilatation.

Pancreas: Unremarkable. No pancreatic ductal dilatation or
surrounding inflammatory changes.

Spleen: Normal in size without focal abnormality.

Adrenals/Urinary Tract: Adrenal glands are unremarkable. Kidneys are
normal in size without focal lesions. An ill-defined 2 mm
obstructing renal stone is seen at the right UVJ (axial CT image 73,
CT series number 2), with mild to moderate severity right-sided
hydronephrosis and hydroureter. Bladder is unremarkable.

Stomach/Bowel: Stomach is within normal limits. Appendix appears
normal. No evidence of bowel wall thickening, distention, or
inflammatory changes. Noninflamed diverticula are seen throughout
the large bowel.

Vascular/Lymphatic: Mild aortic atherosclerosis. No enlarged
abdominal or pelvic lymph nodes.

Reproductive: Uterus and bilateral adnexa are unremarkable.

Other: No abdominal wall hernia or abnormality. No abdominopelvic
ascites.

Musculoskeletal: Mild degenerative changes seen within the lumbar
spine.
IMPRESSION: 1. Obstructing 2 mm stone at the right UVJ, with mild to moderate
severity right-sided hydronephrosis and hydroureter.
2. Noninflamed diverticulosis.
3. Mild aortic atherosclerosis.

Aortic Atherosclerosis (686AE-CV9.9).

## 2021-06-06 ENCOUNTER — Encounter (HOSPITAL_BASED_OUTPATIENT_CLINIC_OR_DEPARTMENT_OTHER): Payer: Self-pay | Admitting: Obstetrics & Gynecology

## 2021-06-21 ENCOUNTER — Ambulatory Visit (INDEPENDENT_AMBULATORY_CARE_PROVIDER_SITE_OTHER): Payer: Medicare Other | Admitting: Obstetrics & Gynecology

## 2021-06-21 ENCOUNTER — Encounter (HOSPITAL_BASED_OUTPATIENT_CLINIC_OR_DEPARTMENT_OTHER): Payer: Self-pay | Admitting: Obstetrics & Gynecology

## 2021-06-21 ENCOUNTER — Other Ambulatory Visit: Payer: Self-pay

## 2021-06-21 VITALS — BP 132/68 | HR 89 | Ht 67.0 in | Wt 211.0 lb

## 2021-06-21 DIAGNOSIS — R300 Dysuria: Secondary | ICD-10-CM | POA: Diagnosis not present

## 2021-06-21 LAB — POCT URINALYSIS DIPSTICK
Glucose, UA: NEGATIVE
Nitrite, UA: POSITIVE
Protein, UA: POSITIVE — AB
Spec Grav, UA: 1.02 (ref 1.010–1.025)
Urobilinogen, UA: 1 E.U./dL
pH, UA: 6 (ref 5.0–8.0)

## 2021-06-21 MED ORDER — SULFAMETHOXAZOLE-TRIMETHOPRIM 800-160 MG PO TABS: 1.0000 | ORAL_TABLET | Freq: Two times a day (BID) | ORAL | 0 refills | Status: DC

## 2021-06-21 NOTE — Progress Notes (Signed)
GYNECOLOGY  VISIT  CC:   dysuria x 5 days  HPI: 66 y.o. G3P0012 Married White or Caucasian female here for five day history of dysuria and increased urinary urgency.  Denies fever, low back pain or hematuria.  Urine is cloudy.  H/o TVT 2014.  Lots of stressors right now.  Sister has been diagnosed with cognitive impairment.  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  Finances are not great.  Pt and spouse really helping.   Patient Active Problem List   Diagnosis Date Noted   Viral illness 01/10/2021   Postmenopausal 09/21/2020   History of bilateral salpingo-oophorectomy (BSO) 09/21/2020   Prediabetes 09/07/2019   Elevated transaminase level 06/05/2017   Hyperlipidemia 06/05/2017   Anemia, unspecified 06/05/2017   GERD (gastroesophageal reflux disease) 06/05/2017   OSA (obstructive sleep apnea) 06/05/2017   Obesity with body mass index (BMI) of 30.0 to 39.9 06/05/2017   Urinary incontinence 09/18/2012   Diverticulitis of colon 07/30/2012   Adjustment disorder with mixed anxiety and depressed mood 07/28/2012    Past Medical History:  Diagnosis Date   Anemia    Anxiety    Arthritis    Depression    Diverticulosis    GERD (gastroesophageal reflux disease)    tums only PRN   History of diverticulitis    Kidney stone    Ovarian cyst    Prediabetes    Sleep apnea    no CPAP    Past Surgical History:  Procedure Laterality Date   BLADDER SUSPENSION N/A 04/24/2013   Procedure: TRANSVAGINAL TAPE (TVT) Exact PROCEDURE;  Surgeon: Elveria Royals, MD;  Location: [facility];  Service: Gynecology;  Laterality: N/A;   CARPAL TUNNEL RELEASE Bilateral 1997   CATARACT EXTRACTION, BILATERAL     COLONOSCOPY     CYSTOSCOPY N/A 04/24/2013   Procedure: CYSTOSCOPY;  Surgeon: Elveria Royals, MD;  Location: [facility];  Service: Gynecology;  Laterality: N/A;   GIVENS CAPSULE STUDY N/A 10/21/2017   Procedure: GIVENS CAPSULE STUDY;  Surgeon: Doran Stabler, MD;  Location: [facility];   Service: Gastroenterology;  Laterality: N/A;   LAPAROSCOPY Bilateral 05/03/2014   Procedure: LAPAROSCOPIC BILATERAL SALPINGO OOPHORECTOMY;  Surgeon: Lyman Speller, MD;  Location: [facility];  Service: Gynecology;  Laterality: Bilateral;   TOTAL KNEE ARTHROPLASTY Bilateral 04/2010    MEDS:   Current Outpatient Medications on File Prior to Visit  Medication Sig Dispense Refill   ALPRAZolam (XANAX) 0.5 MG tablet Take 1 tablet by mouth at bedtime as needed for anxiety.      buPROPion (WELLBUTRIN XL) 150 MG 24 hr tablet Take 150 mg by mouth 3 (three) times daily.     imipramine (TOFRANIL) 50 MG tablet Take 50 mg by mouth Daily. (Patient not taking: Reported on 06/21/2021)     pantoprazole (PROTONIX) 40 MG tablet Take 1 tablet (40 mg total) by mouth daily. (Patient not taking: Reported on 06/21/2021) 30 tablet 3   PARoxetine (PAXIL) 40 MG tablet Take 40 mg by mouth daily.  (Patient not taking: Reported on 06/21/2021)     No current facility-administered medications on file prior to visit.    ALLERGIES: Trintellix [vortioxetine]  Family History  Problem Relation Age of Onset   Multiple myeloma Mother    Early death Mother    Heart attack Father    Alcohol abuse Father    COPD Father    Cancer Maternal Grandmother    Alcohol abuse Maternal Grandfather    Depression Maternal  Grandfather    Heart disease Paternal Grandmother    Colon cancer Neg Hx    Stomach cancer Neg Hx    Esophageal cancer Neg Hx    Rectal cancer Neg Hx     SH:  married, non smoker  Review of Systems  Genitourinary:  Positive for dysuria and urgency.   PHYSICAL EXAMINATION:    BP 132/68 (BP Location: Right Arm, Patient Position: Sitting, Cuff Size: Large)    Pulse 89    Ht '5\' 7"'  (1.702 m) Comment: reported   Wt 211 lb (95.7 kg)    LMP 01/28/2010    BMI 33.05 kg/m     Physical Exam Constitutional:      Appearance: Normal appearance.  Musculoskeletal:     Comments: No CVA tenderness  Neurological:     General:  No focal deficit present.     Mental Status: She is alert.  Psychiatric:        Mood and Affect: Mood normal.     Assessment/Plan: 1. Dysuria - POCT Urinalysis Dipstick - Urine Culture - sulfamethoxazole-trimethoprim (BACTRIM DS) 800-160 MG tablet; Take 1 tablet by mouth 2 (two) times daily.  Dispense: 10 tablet; Refill: 0

## 2021-06-26 LAB — URINE CULTURE

## 2021-06-27 ENCOUNTER — Other Ambulatory Visit (HOSPITAL_BASED_OUTPATIENT_CLINIC_OR_DEPARTMENT_OTHER): Payer: Self-pay | Admitting: Obstetrics & Gynecology

## 2021-07-20 ENCOUNTER — Other Ambulatory Visit (HOSPITAL_BASED_OUTPATIENT_CLINIC_OR_DEPARTMENT_OTHER): Payer: Self-pay | Admitting: Obstetrics & Gynecology

## 2021-07-20 ENCOUNTER — Telehealth (HOSPITAL_BASED_OUTPATIENT_CLINIC_OR_DEPARTMENT_OTHER): Payer: Self-pay | Admitting: Obstetrics & Gynecology

## 2021-07-20 ENCOUNTER — Ambulatory Visit (INDEPENDENT_AMBULATORY_CARE_PROVIDER_SITE_OTHER): Payer: Medicare Other

## 2021-07-20 ENCOUNTER — Other Ambulatory Visit: Payer: Self-pay

## 2021-07-20 DIAGNOSIS — R300 Dysuria: Secondary | ICD-10-CM | POA: Diagnosis not present

## 2021-07-20 DIAGNOSIS — R350 Frequency of micturition: Secondary | ICD-10-CM

## 2021-07-20 DIAGNOSIS — N3001 Acute cystitis with hematuria: Secondary | ICD-10-CM

## 2021-07-20 LAB — POCT URINALYSIS DIPSTICK
Glucose, UA: NEGATIVE
Nitrite, UA: NEGATIVE
Protein, UA: POSITIVE — AB
Spec Grav, UA: 1.025 (ref 1.010–1.025)
Urobilinogen, UA: 2 E.U./dL — AB
pH, UA: 6 (ref 5.0–8.0)

## 2021-07-20 MED ORDER — NITROFURANTOIN MONOHYD MACRO 100 MG PO CAPS: 100.0000 mg | ORAL_CAPSULE | Freq: Two times a day (BID) | ORAL | 0 refills | Status: DC

## 2021-07-20 NOTE — Telephone Encounter (Signed)
Patient called and  would like for someone to please call her state that she still having the same problems. ?

## 2021-07-20 NOTE — Telephone Encounter (Signed)
Patient called and  would like for someone to please call her stated she still having the same problems. ?

## 2021-07-20 NOTE — Progress Notes (Signed)
Patient came in today with complaints of burning while urinating. Patient gave a urine sample to be tested. Urine has been sent out for culture. tbw ?

## 2021-07-21 NOTE — Telephone Encounter (Signed)
Pt complains of burning with urination. Was recently treated for UTI. Symptoms improved with antibiotic but have now returned. Pt to come to office to provide [name] with urine sample.  ?

## 2021-07-23 LAB — URINE CULTURE

## 2021-07-23 NOTE — Addendum Note (Signed)
Addended by: Megan Salon on: 07/23/2021 11:47 PM ? ? Modules accepted: Orders ? ?

## 2021-07-28 ENCOUNTER — Ambulatory Visit (INDEPENDENT_AMBULATORY_CARE_PROVIDER_SITE_OTHER): Payer: Medicare Other | Admitting: *Deleted

## 2021-07-28 DIAGNOSIS — N3001 Acute cystitis with hematuria: Secondary | ICD-10-CM

## 2021-07-28 NOTE — Progress Notes (Signed)
Pt here for repeat urine culture after completing antibiotics for UTI. Pt states that symptoms have improved.  ?

## 2021-07-29 LAB — URINALYSIS, MICROSCOPIC ONLY: Bacteria, UA: NONE SEEN

## 2021-07-31 LAB — URINE CULTURE

## 2021-08-01 ENCOUNTER — Encounter (HOSPITAL_BASED_OUTPATIENT_CLINIC_OR_DEPARTMENT_OTHER): Payer: Self-pay | Admitting: Obstetrics & Gynecology

## 2021-08-07 NOTE — Addendum Note (Signed)
Addended by: Jaquita Folds on: 08/07/2021 05:41 PM ? ? Modules accepted: Orders ? ?

## 2021-08-28 ENCOUNTER — Other Ambulatory Visit (HOSPITAL_BASED_OUTPATIENT_CLINIC_OR_DEPARTMENT_OTHER): Payer: Self-pay | Admitting: Obstetrics & Gynecology

## 2021-08-28 DIAGNOSIS — N390 Urinary tract infection, site not specified: Secondary | ICD-10-CM

## 2021-09-04 ENCOUNTER — Ambulatory Visit (INDEPENDENT_AMBULATORY_CARE_PROVIDER_SITE_OTHER): Payer: Medicare Other

## 2021-09-04 DIAGNOSIS — Z0000 Encounter for general adult medical examination without abnormal findings: Secondary | ICD-10-CM | POA: Diagnosis not present

## 2021-09-04 NOTE — Progress Notes (Signed)
? ?Subjective:  ? Jasmine [name] is a 66 y.o. female who presents for an Initial Medicare Annual Wellness Visit. ? ?I connected with  [name] on 09/04/21 by a audio enabled telemedicine application and verified that I am speaking with the correct person using two identifiers. ? ?Patient Location: Home ? ?Provider Location: Office/Clinic ? ?I discussed the limitations of evaluation and management by telemedicine. The patient expressed understanding and agreed to proceed. ? ? ?Review of Systems    ? ?Cardiac Risk Factors include: advanced age (>63mn, >>79women);dyslipidemia ? ?   ?Objective:  ?  ?There were no vitals filed for this visit. ?There is no height or weight on file to calculate BMI. ? ? ?  09/04/2021  ?  9:48 AM 06/05/2019  ?  5:21 PM 10/09/2017  ?  2:43 PM 04/29/2014  ? 10:10 AM 04/16/2013  ? 10:31 AM  ?Advanced Directives  ?Does Patient Have a Medical Advance Directive? Yes No No;Yes Yes Patient has advance directive, copy not in chart  ?Type of [place] of facility DNR (pink MOST or yellow form);Living will    Living will  ?Does patient want to make changes to medical advance directive? No - Patient declined      ?[place]? No - copy requested   No - copy requested   ? ? ?Current Medications (verified) ?Outpatient Encounter Medications as of 09/04/2021  ?Medication Sig  ? ALPRAZolam (XANAX) 0.5 MG tablet Take 1 tablet by mouth at bedtime as needed for anxiety.   ? Desvenlafaxine ER (PRISTIQ) 50 MG TB24   ? [DISCONTINUED] buPROPion (WELLBUTRIN XL) 150 MG 24 hr tablet Take 150 mg by mouth 3 (three) times daily.  ? [DISCONTINUED] imipramine (TOFRANIL) 50 MG tablet Take 50 mg by mouth Daily. (Patient not taking: Reported on 06/21/2021)  ? [DISCONTINUED] nitrofurantoin, macrocrystal-monohydrate, (MACROBID) 100 MG capsule Take 1 capsule (100 mg total) by mouth 2 (two) times daily.  ? [DISCONTINUED] pantoprazole (PROTONIX) 40  MG tablet Take 1 tablet (40 mg total) by mouth daily. (Patient not taking: Reported on 06/21/2021)  ? [DISCONTINUED] PARoxetine (PAXIL) 40 MG tablet Take 40 mg by mouth daily.  (Patient not taking: Reported on 06/21/2021)  ? [DISCONTINUED] sulfamethoxazole-trimethoprim (BACTRIM DS) 800-160 MG tablet Take 1 tablet by mouth 2 (two) times daily.  ? ?No facility-administered encounter medications on file as of 09/04/2021.  ? ? ?Allergies (verified) ?Trintellix [vortioxetine]  ? ?History: ?Past Medical History:  ?Diagnosis Date  ? Anemia   ? Anxiety   ? Arthritis   ? Depression   ? Diverticulosis   ? GERD (gastroesophageal reflux disease)   ? tums only PRN  ? History of diverticulitis   ? Kidney stone   ? Ovarian cyst   ? Prediabetes   ? Sleep apnea   ? no CPAP  ? ?Past Surgical History:  ?Procedure Laterality Date  ? BLADDER SUSPENSION N/A 04/24/2013  ? Procedure: TRANSVAGINAL TAPE (TVT) Exact PROCEDURE;  Surgeon: VElveria Royals MD;  [facility];  Service: Gynecology;  Laterality: N/A;  ? CARPAL TUNNEL RELEASE Bilateral 1997  ? CATARACT EXTRACTION, BILATERAL    ? COLONOSCOPY    ? CYSTOSCOPY N/A 04/24/2013  ? Procedure: CYSTOSCOPY;  Surgeon: VElveria Royals MD;  [facility];  Service: Gynecology;  Laterality: N/A;  ? GIVENS CAPSULE STUDY N/A 10/21/2017  ? Procedure: GIVENS CAPSULE STUDY;  Surgeon: DDoran Stabler MD;  [facility];  Service: Gastroenterology;  Laterality: N/A;  ? LAPAROSCOPY Bilateral 05/03/2014  ? Procedure: LAPAROSCOPIC BILATERAL SALPINGO OOPHORECTOMY;  Surgeon: Lyman Speller, MD;  Location: [facility];  Service: Gynecology;  Laterality: Bilateral;  ? MOUTH SURGERY    ? TOTAL KNEE ARTHROPLASTY Bilateral 04/2010  ? ?Family History  ?Problem Relation Age of Onset  ? Multiple myeloma Mother   ? Early death Mother   ? Heart attack Father   ? Alcohol abuse Father   ? COPD Father   ? Cancer Maternal Grandmother   ? Alcohol abuse Maternal Grandfather   ? Depression Maternal  Grandfather   ? Heart disease Paternal Grandmother   ? Colon cancer Neg Hx   ? Stomach cancer Neg Hx   ? Esophageal cancer Neg Hx   ? Rectal cancer Neg Hx   ? ?Social History  ? ?Socioeconomic History  ? Marital status: Married  ?  Spouse name: Not on file  ? Number of children: 2  ? Years of education: Not on file  ? Highest education level: Not on file  ?Occupational History  ? Not on file  ?Tobacco Use  ? Smoking status: Former  ?  Packs/day: 0.50  ?  Types: Cigarettes  ?  Quit date: 10/28/2016  ?  Years since quitting: 4.8  ? Smokeless tobacco: Never  ? Tobacco comments:  ?  1/2 PPD  ?Vaping Use  ? Vaping Use: Never used  ?Substance and Sexual Activity  ? Alcohol use: Not Currently  ? Drug use: No  ? Sexual activity: Not Currently  ?  Partners: Male  ?  Birth control/protection: Abstinence  ?Other Topics Concern  ? Not on file  ?Social History Narrative  ? Not on file  ? ?Social Determinants of Health  ? ?Financial Resource Strain: Not on file  ?Food Insecurity: Not on file  ?Transportation Needs: Not on file  ?Physical Activity: Not on file  ?Stress: Not on file  ?Social Connections: Not on file  ? ? ?Tobacco Counseling ?Counseling given: Not Answered ?Tobacco comments: 1/2 PPD ? ? ?Clinical Intake: ? ?Pre-visit preparation completed: Yes ? ?Pain : No/denies pain ? ?  ? ?Nutritional Risks: None ?Diabetes: No ? ?How often do you need to have someone help you when you read instructions, pamphlets, or other written materials from your doctor or pharmacy?: 1 - Never ? ?Diabetic?No ? ?Interpreter Needed?: No ? ?Information entered by :: Cortni Tays ? ? ?Activities of Daily Living ? ?  09/04/2021  ?  9:52 AM 01/10/2021  ?  4:37 PM  ?In your present state of health, do you have any difficulty performing the following activities:  ?Hearing? 0 0  ?Vision? 0 0  ?Difficulty concentrating or making decisions? 0 0  ?Walking or climbing stairs? 0 0  ?Dressing or bathing? 0 0  ?Doing errands, shopping? 0 0  ?Preparing Food and  eating ? N   ?Using the Toilet? N   ?In the past six months, have you accidently leaked urine? N   ?Do you have problems with loss of bowel control? N   ?Managing your Medications? N   ?Managing your Finances? N   ?Housekeeping or managing your Housekeeping? N   ? ? ?Patient Care Team: ?Copland, Javaeh Filler, MD as PCP - General (Family Medicine) ?Megan Salon, MD as Consulting Physician (Gynecology) ?Monna Fam, MD as Consulting Physician (Ophthalmology) ?Gaynelle Arabian, MD as Consulting Physician (Orthopedic Surgery) ?Lafayette Dragon, MD (Inactive) as Consulting Physician (Gastroenterology) ?Chucky May,  MD as Consulting Physician (Psychiatry) ? ?Indicate any recent Medical Services you may have received from other than [facility] providers in the past year (date may be approximate). ? ?   ?Assessment:  ? This is a routine wellness examination for Idania. ? ?Hearing/Vision screen ?No results found. ? ?Dietary issues and exercise activities discussed: ?Current Exercise Habits: Home exercise routine, Type of exercise: walking, Time (Minutes): 45, Frequency (Times/Week): 7, Weekly Exercise (Minutes/Week): 315, Intensity: Mild, Exercise limited by: None identified ? ? Goals Addressed   ?None ?  ? ?Depression Screen ? ?  09/04/2021  ?  9:49 AM 06/21/2021  ?  1:51 PM 09/20/2020  ?  2:48 PM 01/05/2015  ? 12:16 PM 11/10/2014  ?  4:18 PM  ?PHQ 2/9 Scores  ?PHQ - 2 Score 2 1 0 0 0  ?PHQ- 9 Score 4      ?  ?Fall Risk ? ?  09/04/2021  ?  9:49 AM  ?Fall Risk   ?Falls in the past year? 0  ?Number falls in past yr: 0  ?Injury with Fall? 0  ?Risk for fall due to : No Fall Risks  ?Follow up Falls evaluation completed  ? ? ?FALL RISK PREVENTION PERTAINING TO THE HOME: ? ?Any stairs in or around the home? Yes  ?If so, are there any without handrails? No  ?Home free of loose throw rugs in walkways, pet beds, electrical cords, etc? Yes  ?Adequate lighting in your home to reduce risk of falls? Yes  ? ?ASSISTIVE DEVICES UTILIZED TO PREVENT  FALLS: ? ?Life alert? No  ?Use of a cane, walker or w/c? No  ?Grab bars in the bathroom? Yes  ?Shower chair or bench in shower? No  ?Elevated toilet seat or a handicapped toilet? Yes  ? ?TIMED UP AND GO: ? ?Was the tes

## 2021-09-04 NOTE — Patient Instructions (Signed)
Ms. Weismann , ?Thank you for taking time to come for your Medicare Wellness Visit. I appreciate your ongoing commitment to your health goals. Please review the following plan we discussed and let me know if I can assist you in the future.  ? ?Screening recommendations/referrals: ?Colonoscopy: 10/09/17 due 10/09/24 ?Mammogram: 11/14/20 due 11/14/21 ?Bone Density: 11/24/20 due 11/25/22 ?Recommended yearly ophthalmology/optometry visit for glaucoma screening and checkup ?Recommended yearly dental visit for hygiene and checkup ? ?Vaccinations: ?Influenza vaccine: up to date per pt ?Pneumococcal vaccine: up to date per pt ?Tdap vaccine: up to date ?Shingles vaccine: up to date   ?Covid-19:completed ? ?Advanced directives: yes, not on file ? ?Conditions/risks identified: see problem list ? ?Next appointment: Follow up in one year for your annual wellness visit  ? ? ?Preventive Care 70 Years and Older, Female ?Preventive care refers to lifestyle choices and visits with your health care provider that can promote health and wellness. ?What does preventive care include? ?A yearly physical exam. This is also called an annual well check. ?Dental exams once or twice a year. ?Routine eye exams. Ask your health care provider how often you should have your eyes checked. ?Personal lifestyle choices, including: ?Daily care of your teeth and gums. ?Regular physical activity. ?Eating a healthy diet. ?Avoiding tobacco and drug use. ?Limiting alcohol use. ?Practicing safe sex. ?Taking low-dose aspirin every day. ?Taking vitamin and mineral supplements as recommended by your health care provider. ?What happens during an annual well check? ?The services and screenings done by your health care provider during your annual well check will depend on your age, overall health, lifestyle risk factors, and family history of disease. ?Counseling  ?Your health care provider may ask you questions about your: ?Alcohol use. ?Tobacco use. ?Drug  use. ?Emotional well-being. ?Home and relationship well-being. ?Sexual activity. ?Eating habits. ?History of falls. ?Memory and ability to understand (cognition). ?Work and work Statistician. ?Reproductive health. ?Screening  ?You may have the following tests or measurements: ?Height, weight, and BMI. ?Blood pressure. ?Lipid and cholesterol levels. These may be checked every 5 years, or more frequently if you are over 66 years old. ?Skin check. ?Lung cancer screening. You may have this screening every year starting at age 47 if you have a 30-pack-year history of smoking and currently smoke or have quit within the past 15 years. ?Fecal occult blood test (FOBT) of the stool. You may have this test every year starting at age 50. ?Flexible sigmoidoscopy or colonoscopy. You may have a sigmoidoscopy every 5 years or a colonoscopy every 10 years starting at age 22. ?Hepatitis C blood test. ?Hepatitis B blood test. ?Sexually transmitted disease (STD) testing. ?Diabetes screening. This is done by checking your blood sugar (glucose) after you have not eaten for a while (fasting). You may have this done every 1-3 years. ?Bone density scan. This is done to screen for osteoporosis. You may have this done starting at age 65. ?Mammogram. This may be done every 1-2 years. Talk to your health care provider about how often you should have regular mammograms. ?Talk with your health care provider about your test results, treatment options, and if necessary, the need for more tests. ?Vaccines  ?Your health care provider may recommend certain vaccines, such as: ?Influenza vaccine. This is recommended every year. ?Tetanus, diphtheria, and acellular pertussis (Tdap, Td) vaccine. You may need a Td booster every 10 years. ?Zoster vaccine. You may need this after age 33. ?Pneumococcal 13-valent conjugate (PCV13) vaccine. One dose is recommended after age 21. ?  Pneumococcal polysaccharide (PPSV23) vaccine. One dose is recommended after age  14. ?Talk to your health care provider about which screenings and vaccines you need and how often you need them. ?This information is not intended to replace advice given to you by your health care provider. Make sure you discuss any questions you have with your health care provider. ?Document Released: 05/13/2015 Document Revised: 01/04/2016 Document Reviewed: 02/15/2015 ?Elsevier Interactive Patient Education ? 2017 [place]. ? ?Fall Prevention in the Home ?Falls can cause injuries. They can happen to people of all ages. There are many things you can do to make your home safe and to help prevent falls. ?What can I do on the outside of my home? ?Regularly fix the edges of walkways and driveways and fix any cracks. ?Remove anything that might make you trip as you walk through a door, such as a raised step or threshold. ?Trim any bushes or trees on the path to your home. ?Use bright outdoor lighting. ?Clear any walking paths of anything that might make someone trip, such as rocks or tools. ?Regularly check to see if handrails are loose or broken. Make sure that both sides of any steps have handrails. ?Any raised decks and porches should have guardrails on the edges. ?Have any leaves, snow, or ice cleared regularly. ?Use sand or salt on walking paths during winter. ?Clean up any spills in your garage right away. This includes oil or grease spills. ?What can I do in the bathroom? ?Use night lights. ?Install grab bars by the toilet and in the tub and shower. Do not use towel bars as grab bars. ?Use non-skid mats or decals in the tub or shower. ?If you need to sit down in the shower, use a plastic, non-slip stool. ?Keep the floor dry. Clean up any water that spills on the floor as soon as it happens. ?Remove soap buildup in the tub or shower regularly. ?Attach bath mats securely with double-sided non-slip rug tape. ?Do not have throw rugs and other things on the floor that can make you trip. ?What can I do in the  bedroom? ?Use night lights. ?Make sure that you have a light by your bed that is easy to reach. ?Do not use any sheets or blankets that are too big for your bed. They should not hang down onto the floor. ?Have a firm chair that has side arms. You can use this for support while you get dressed. ?Do not have throw rugs and other things on the floor that can make you trip. ?What can I do in the kitchen? ?Clean up any spills right away. ?Avoid walking on wet floors. ?Keep items that you use a lot in easy-to-reach places. ?If you need to reach something above you, use a strong step stool that has a grab bar. ?Keep electrical cords out of the way. ?Do not use floor polish or wax that makes floors slippery. If you must use wax, use non-skid floor wax. ?Do not have throw rugs and other things on the floor that can make you trip. ?What can I do with my stairs? ?Do not leave any items on the stairs. ?Make sure that there are handrails on both sides of the stairs and use them. Fix handrails that are broken or loose. Make sure that handrails are as long as the stairways. ?Check any carpeting to make sure that it is firmly attached to the stairs. Fix any carpet that is loose or worn. ?Avoid having throw rugs at the  top or bottom of the stairs. If you do have throw rugs, attach them to the floor with carpet tape. ?Make sure that you have a light switch at the top of the stairs and the bottom of the stairs. If you do not have them, ask someone to add them for you. ?What else can I do to help prevent falls? ?Wear shoes that: ?Do not have high heels. ?Have rubber bottoms. ?Are comfortable and fit you well. ?Are closed at the toe. Do not wear sandals. ?If you use a stepladder: ?Make sure that it is fully opened. Do not climb a closed stepladder. ?Make sure that both sides of the stepladder are locked into place. ?Ask someone to hold it for you, if possible. ?Clearly mark and make sure that you can see: ?Any grab bars or  handrails. ?First and last steps. ?Where the edge of each step is. ?Use tools that help you move around (mobility aids) if they are needed. These include: ?Canes. ?Walkers. ?Scooters. ?Crutches. ?Turn on the lights when you go into a d

## 2021-09-27 ENCOUNTER — Ambulatory Visit (HOSPITAL_BASED_OUTPATIENT_CLINIC_OR_DEPARTMENT_OTHER): Payer: Medicare Other | Admitting: Obstetrics & Gynecology

## 2021-09-28 ENCOUNTER — Ambulatory Visit (INDEPENDENT_AMBULATORY_CARE_PROVIDER_SITE_OTHER): Payer: Medicare Other | Admitting: Obstetrics & Gynecology

## 2021-09-28 ENCOUNTER — Encounter (HOSPITAL_BASED_OUTPATIENT_CLINIC_OR_DEPARTMENT_OTHER): Payer: Self-pay | Admitting: Obstetrics & Gynecology

## 2021-09-28 VITALS — BP 125/82 | HR 106 | Ht 67.0 in | Wt 199.4 lb

## 2021-09-28 DIAGNOSIS — N390 Urinary tract infection, site not specified: Secondary | ICD-10-CM | POA: Diagnosis not present

## 2021-09-28 DIAGNOSIS — Z9189 Other specified personal risk factors, not elsewhere classified: Secondary | ICD-10-CM

## 2021-09-28 DIAGNOSIS — R3129 Other microscopic hematuria: Secondary | ICD-10-CM

## 2021-09-28 DIAGNOSIS — Z90722 Acquired absence of ovaries, bilateral: Secondary | ICD-10-CM

## 2021-09-28 DIAGNOSIS — Z780 Asymptomatic menopausal state: Secondary | ICD-10-CM

## 2021-09-28 DIAGNOSIS — Z9079 Acquired absence of other genital organ(s): Secondary | ICD-10-CM

## 2021-09-28 DIAGNOSIS — B351 Tinea unguium: Secondary | ICD-10-CM

## 2021-09-28 DIAGNOSIS — N952 Postmenopausal atrophic vaginitis: Secondary | ICD-10-CM

## 2021-09-28 MED ORDER — CICLOPIROX 8 % EX SOLN: Freq: Every day | CUTANEOUS | 5 refills | Status: DC

## 2021-09-28 NOTE — Progress Notes (Signed)
66 y.o. [id number] Married White or Caucasian female here for breast and pelvic exam.  Having microscopic hematuria and recurrent UTIs this year.  With follow up urine sampling, microscopic hematuria still present so pt was referred to urology.  Saw Dr. Claudia Desanctis.  CT done showing two stones.  Procedure for removal is planned.  Has low attenuating lesion in opposite kidney.  Asked if I would review this.  MRI for additional evaluation recommended.  CT reviewed per request.  Treatment with vaginal estrogen cream recommended as well.  Pt not sure she wants to start this and asks reasoning for starting.  Discussed evidence showing decreased UTIs with vaginal estrogen.  Pt and I discussed waiting until after stone removal completed and then starting estrogen if has another UTI.  She is comfortable with this plan.  Has been having stressors with her sister with Alzheimer's.  They are helping move her into a facility in [place].  Daughter just had third child, a boy.  Grandchildren are a joy for her.  Admits she has struggled with her depression more this year.  Has some toenail changes.  Would like to me to look at this.  Denies vaginal bleeding.  Patient's last menstrual period was 01/28/2010.          Sexually active: No.  H/O STD:  no  Health Maintenance: PCP:  Dr. Lorelei Pont.  Last wellness appt was 08/2019.  Did blood work at that appt:   Vaccines are up to date:  needs pneumonia vaccination Colonoscopy:  10/09/2017 MMG:  11/14/2020 Negative BMD:  11/24/2020 Last pap smear:  09/20/2020 Negative.   H/o abnormal pap smear:  remote hx of ASCUS pap    reports that she quit smoking about 4 years ago. Her smoking use included cigarettes. She smoked an average of .5 packs per day. She has never used smokeless tobacco. She reports that she does not currently use alcohol. She reports that she does not use drugs.  Past Medical History:  Diagnosis Date   Anemia    Anxiety    Arthritis    Depression     Diverticulosis    GERD (gastroesophageal reflux disease)    tums only PRN   History of diverticulitis    Kidney stone    Ovarian cyst    Prediabetes    Sleep apnea    no CPAP    Past Surgical History:  Procedure Laterality Date   BLADDER SUSPENSION N/A 04/24/2013   Procedure: TRANSVAGINAL TAPE (TVT) Exact PROCEDURE;  Surgeon: Elveria Royals, MD;  Location: [facility];  Service: Gynecology;  Laterality: N/A;   CARPAL TUNNEL RELEASE Bilateral 1997   CATARACT EXTRACTION, BILATERAL     COLONOSCOPY     CYSTOSCOPY N/A 04/24/2013   Procedure: CYSTOSCOPY;  Surgeon: Elveria Royals, MD;  Location: [facility];  Service: Gynecology;  Laterality: N/A;   GIVENS CAPSULE STUDY N/A 10/21/2017   Procedure: GIVENS CAPSULE STUDY;  Surgeon: Doran Stabler, MD;  Location: [facility];  Service: Gastroenterology;  Laterality: N/A;   LAPAROSCOPY Bilateral 05/03/2014   Procedure: LAPAROSCOPIC BILATERAL SALPINGO OOPHORECTOMY;  Surgeon: Lyman Speller, MD;  Location: [facility];  Service: Gynecology;  Laterality: Bilateral;   MOUTH SURGERY     TOTAL KNEE ARTHROPLASTY Bilateral 04/2010    Current Outpatient Medications  Medication Sig Dispense Refill   ALPRAZolam (XANAX) 0.5 MG tablet Take 1 tablet by mouth at bedtime as needed for anxiety.      ciclopirox (PENLAC) 8 % solution Apply  topically at bedtime. Apply over nail and surrounding skin. Apply daily over previous coat. After seven (7) days, may remove with alcohol and continue cycle. 6.6 mL 5   Desvenlafaxine ER (PRISTIQ) 50 MG TB24      No current facility-administered medications for this visit.    Family History  Problem Relation Age of Onset   Multiple myeloma Mother    Early death Mother    Heart attack Father    Alcohol abuse Father    COPD Father    Cancer Maternal Grandmother    Alcohol abuse Maternal Grandfather    Depression Maternal Grandfather    Heart disease Paternal Grandmother    Colon cancer Neg Hx    Stomach cancer Neg Hx     Esophageal cancer Neg Hx    Rectal cancer Neg Hx     Review of Systems  Constitutional: Negative.   Genitourinary: Negative.    Exam:   BP 125/82 (BP Location: Right Arm, Patient Position: Sitting, Cuff Size: Large)   Pulse (!) 106   Ht '5\' 7"'  (1.702 m) Comment: Reported  Wt 199 lb 6.4 oz (90.4 kg)   LMP 01/28/2010   BMI 31.23 kg/m   Height: '5\' 7"'  (170.2 cm) (Reported)  General appearance: alert, cooperative and appears stated age Breasts: normal appearance, no masses or tenderness Abdomen: soft, non-tender; bowel sounds normal; no masses,  no organomegaly Lymph nodes: Cervical, supraclavicular, and axillary nodes normal.  No abnormal inguinal nodes palpated Neurologic: Grossly normal Ext: right big toe with thickened and yellowish nail changes  Pelvic: External genitalia:  no lesions              Urethra:  normal appearing urethra with no masses, tenderness or lesions              Bartholins and Skenes: normal                 Vagina: normal appearing vagina with atrophic changes and no discharge, no lesions              Cervix: no lesions              Pap taken: No. Bimanual Exam:  Uterus:  normal size, contour, position, consistency, mobility, non-tender              Adnexa: normal adnexa and no mass, fullness, tenderness               Rectovaginal: Confirms               Anus:  normal sphincter tone, no lesions  Chaperone, Octaviano Batty, CMA, was present for exam.  Assessment/Plan: 1. GYN exam for high-risk Medicare patient - pap neg 2022.  Not indicated today. - MMG 11/14/2020 - colonoscopy 09/2017 - BMD 10/2020 - lab work done with Dr. Lorelei Pont - vaccines reviewed  2. Postmenopausal - no HRT  3. Vaginal atrophy - not starting treatment at this time  4. Recurrent UTI and Microscopic hematuria - followed currently by Dr. Claudia Desanctis  5. History of bilateral salpingo-oophorectomy (BSO)  6.  Toenail fungus - do not feel comfortable prescribing oral agents.  Pt can try  penlac topically.  Rx to pharmacy.

## 2021-10-03 ENCOUNTER — Other Ambulatory Visit: Payer: Self-pay | Admitting: Urology

## 2021-10-03 DIAGNOSIS — D4121 Neoplasm of uncertain behavior of right ureter: Secondary | ICD-10-CM

## 2021-10-17 ENCOUNTER — Ambulatory Visit
Admission: RE | Admit: 2021-10-17 | Discharge: 2021-10-17 | Disposition: A | Discharge status: Home / Self Care | Payer: Medicare Other | Source: Ambulatory Visit | Attending: Urology | Admitting: Urology

## 2021-10-17 DIAGNOSIS — D4121 Neoplasm of uncertain behavior of right ureter: Secondary | ICD-10-CM

## 2021-10-17 MED ORDER — GADOBENATE DIMEGLUMINE 529 MG/ML IV SOLN
17.0000 mL | Freq: Once | INTRAVENOUS | Status: AC | PRN
  Administered 2021-10-17: 17 mL via INTRAVENOUS

## 2021-11-24 ENCOUNTER — Encounter (HOSPITAL_BASED_OUTPATIENT_CLINIC_OR_DEPARTMENT_OTHER): Payer: Self-pay | Admitting: Obstetrics & Gynecology

## 2021-11-30 ENCOUNTER — Other Ambulatory Visit: Payer: Self-pay

## 2021-11-30 ENCOUNTER — Encounter (HOSPITAL_COMMUNITY): Payer: Self-pay

## 2021-11-30 ENCOUNTER — Emergency Department (HOSPITAL_COMMUNITY)
Admission: EM | Admit: 2021-11-30 | Discharge: 2021-11-30 | Disposition: A | Discharge status: Home / Self Care | Payer: Medicare Other | Attending: Emergency Medicine | Admitting: Emergency Medicine

## 2021-11-30 ENCOUNTER — Emergency Department (HOSPITAL_COMMUNITY): Payer: Medicare Other

## 2021-11-30 DIAGNOSIS — R0789 Other chest pain: Secondary | ICD-10-CM | POA: Insufficient documentation

## 2021-11-30 DIAGNOSIS — R079 Chest pain, unspecified: Secondary | ICD-10-CM

## 2021-11-30 LAB — TROPONIN I (HIGH SENSITIVITY)
Troponin I (High Sensitivity): 3 ng/L (ref <18)
Troponin I (High Sensitivity): 3 ng/L (ref <18)

## 2021-11-30 LAB — CBC
HCT: 37.8 % (ref 36.0–46.0)
Hemoglobin: 11.7 g/dL — ABNORMAL LOW (ref 12.0–15.0)
MCH: 25.3 pg — ABNORMAL LOW (ref 26.0–34.0)
MCHC: 31 g/dL (ref 30.0–36.0)
MCV: 81.8 fL (ref 80.0–100.0)
Platelets: 329 10*3/uL (ref 150–400)
RBC: 4.62 MIL/uL (ref 3.87–5.11)
RDW: 16.2 % — ABNORMAL HIGH (ref 11.5–15.5)
WBC: 6.3 10*3/uL (ref 4.0–10.5)
nRBC: 0 % (ref 0.0–0.2)

## 2021-11-30 LAB — BASIC METABOLIC PANEL
Anion gap: 9 (ref 5–15)
BUN: 18 mg/dL (ref 8–23)
CO2: 26 mmol/L (ref 22–32)
Calcium: 9.3 mg/dL (ref 8.9–10.3)
Chloride: 104 mmol/L (ref 98–111)
Creatinine, Ser: 0.93 mg/dL (ref 0.44–1.00)
GFR, Estimated: >60 mL/min (ref >60)
Glucose, Bld: 104 mg/dL — ABNORMAL HIGH (ref 70–99)
Potassium: 3.9 mmol/L (ref 3.5–5.1)
Sodium: 139 mmol/L (ref 135–145)

## 2021-11-30 NOTE — ED Notes (Signed)
Pt states she is no longer having anymore chest pain.

## 2021-11-30 NOTE — Discharge Instructions (Addendum)
We evaluated you today in the emergency department for your chest pain.  Your lab tests, EKG, and chest x-ray were reassuring.  Your pain resolved in the emergency department.  Please follow-up closely with your primary care doctor.  Please call cardiology for follow-up if you would like to follow-up with a cardiologist.  If your pain recurs, or you develop any fevers, chills, nausea, vomiting, sweating, recurrence of pain, shortness of breath, lightheadedness, dizziness, or any other concerning symptoms, please call 911 or return to the emergency department immediately

## 2021-11-30 NOTE — ED Provider Notes (Signed)
[facility] EMERGENCY DEPARTMENT Provider Note   CSN: [id number] Arrival date & time: 11/30/21  0355     History  Chief Complaint  Patient presents with   Chest Pain    Jasmine [name] is a 66 y.o. female presenting to the emergency department with chest pain.  Patient reports around 2 AM, she developed right-sided chest pain which was sharp.  She reports it was moderate.  She had no associated symptoms such as nausea, vomiting, fevers, chills, diaphoresis, lightheadedness, dizziness, syncope.  She denies any radiation of her pain.  The pain is not pleuritic and is not exertional.  The pain is currently resolved.  The pain lasted for a few hours.  Denies similar symptoms in the past.  She also denies cough, leg swelling.  Denies any recent travel or surgeries.  Denies any history of blood clots.  Denies any hormone use.   Chest Pain      Home Medications Prior to Admission medications   Medication Sig Start Date End Date Taking? Authorizing Provider  ALPRAZolam Duanne Moron) 0.5 MG tablet Take 1 tablet by mouth at bedtime as needed for anxiety.  01/08/17   [provider]  ciclopirox (PENLAC) 8 % solution Apply topically at bedtime. Apply over nail and surrounding skin. Apply daily over previous coat. After seven (7) days, may remove with alcohol and continue cycle. 09/28/21   Megan Salon, MD  Desvenlafaxine ER (PRISTIQ) 50 MG TB24  07/18/21   [provider]      Allergies    Trintellix [vortioxetine]    Review of Systems   Review of Systems  Cardiovascular:  Positive for chest pain.  See HPI  Physical Exam Updated Vital Signs BP 136/68   Pulse 69   Temp 98.2 F (36.8 C) (Oral)   Resp 19   Ht '5\' 7"'$  (1.702 m)   Wt 90.7 kg   LMP 01/28/2010   SpO2 100%   BMI 31.32 kg/m  Physical Exam Vitals and nursing note reviewed.  Constitutional:      General: She is not in acute distress.    Appearance: She is well-developed.  HENT:     Head:  Normocephalic and atraumatic.     Mouth/Throat:     Mouth: Mucous membranes are moist.  Eyes:     Pupils: Pupils are equal, round, and reactive to light.  Cardiovascular:     Rate and Rhythm: Normal rate and regular rhythm.     Heart sounds: No murmur heard. Pulmonary:     Effort: Pulmonary effort is normal. No respiratory distress.     Breath sounds: Normal breath sounds.  Abdominal:     General: Abdomen is flat.     Palpations: Abdomen is soft.     Tenderness: There is no abdominal tenderness.  Musculoskeletal:        General: No tenderness.     Right lower leg: No edema.     Left lower leg: No edema.  Skin:    General: Skin is warm and dry.  Neurological:     General: No focal deficit present.     Mental Status: She is alert. Mental status is at baseline.  Psychiatric:        Mood and Affect: Mood normal.        Behavior: Behavior normal.     ED Results / Procedures / Treatments   Labs (all labs ordered are listed, but only abnormal results are displayed) Labs Reviewed  BASIC METABOLIC  PANEL - Abnormal; Notable for the following components:      Result Value   Glucose, Bld 104 (*)    All other components within normal limits  CBC - Abnormal; Notable for the following components:   Hemoglobin 11.7 (*)    MCH 25.3 (*)    RDW 16.2 (*)    All other components within normal limits  TROPONIN I (HIGH SENSITIVITY)  TROPONIN I (HIGH SENSITIVITY)    EKG EKG Interpretation  Date/Time:  Thursday November 30 2021 04:15:29 EDT Ventricular Rate:  86 PR Interval:  160 QRS Duration: 84 QT Interval:  376 QTC Calculation: 449 R Axis:   -73 Text Interpretation: Normal sinus rhythm Indeterminate axis Low voltage QRS Cannot rule out Inferior infarct , age undetermined Cannot rule out Anterior infarct , age undetermined Abnormal ECG When compared with ECG of 05-Jun-2019 18:41, PREVIOUS ECG IS PRESENT Confirmed by Ripley Fraise [phone]) on 11/30/2021 4:28:53 AM  Radiology DG  Chest 2 View  Result Date: 11/30/2021 CLINICAL DATA:  Chest pain. EXAM: CHEST - 2 VIEW COMPARISON:  11/01/2009. FINDINGS: The heart size and mediastinal contours are within normal limits. Apical pleural scarring is noted bilaterally. No consolidation, effusion, or pneumothorax. Degenerative changes are present in the thoracic spine. IMPRESSION: No active cardiopulmonary disease. Electronically Signed   By: Brett Fairy M.D.   On: 11/30/2021 04:29    Procedures Procedures    Medications Ordered in ED Medications - No data to display  ED Course/ Medical Decision Making/ A&P                           Medical Decision Making  66 year old female presenting to the emergency department with chest pain.  Patient well-appearing, physical exam reassuring.  EKG with T wave inversion in aVL and V2 but this appears unchanged from prior, no acute ST or T wave changes concerning for ischemia.  Unclear cause of chest pain, differential includes stress, musculoskeletal chest wall pain.  Doubt PE without risk factors such as recent travel, surgeries, history of blood clots, hormone use.  Doubt ACS with reassuring EKG, negative troponin.  Chest x-ray without findings concerning for pneumonia, pneumothorax.  Doubt dissection given reassuring x-ray, normal peripheral pulses, lack of risk factors.  Doubt esophageal pathology without nausea, vomiting.  Offered follow-up with cardiology, although patient prefers to follow-up with her primary physician.  Provided phone number for cardiology on discharge instructions. Will discharge patient to home. All questions answered. Patient comfortable with plan of discharge. Return precautions discussed with patient and specified on the after visit summary.         Final Clinical Impression(s) / ED Diagnoses Final diagnoses:  Nonspecific chest pain    Rx / DC Orders ED Discharge Orders     None         Cristie Hem, MD 11/30/21 1624

## 2021-11-30 NOTE — ED Provider Triage Note (Signed)
  Emergency Medicine Provider Triage Evaluation Note  MRN:  [id number]  Arrival date & time: 11/30/21    Medically screening exam initiated at 4:09 AM.   CC:   Chest pain  HPI:  Jasmine [name] is a 66 y.o. year-old female presents to the ED with chief complaint of right sided chest pain since yesterday afternoon.  She reports pain is worsened with movement and palpation.  Denies SOB.  History provided by patient ROS:  -As included in HPI PE:   Vitals:   11/30/21 0406  BP: (!) 128/100  Pulse: 99  Resp: 16  Temp: 98 F (36.7 C)  SpO2: 97%    Non-toxic appearing No respiratory distress  MDM:   I've ordered labs in triage to expedite lab/diagnostic workup.  Patient was informed that the remainder of the evaluation will be completed by another provider, this initial triage assessment does not replace that evaluation, and the importance of remaining in the ED until their evaluation is complete.    Montine Circle, PA-C 11/30/21 0410

## 2021-11-30 NOTE — ED Triage Notes (Signed)
Pt reports right sided chest pains after getting out of a chair yesterday afternoon.

## 2021-12-05 ENCOUNTER — Encounter: Payer: Self-pay | Admitting: Cardiovascular Disease

## 2021-12-05 ENCOUNTER — Telehealth: Payer: Self-pay | Admitting: Family Medicine

## 2021-12-05 NOTE — Progress Notes (Unsigned)
Cardiology Office Note:    Date:  12/06/2021   ID:  [name], DOB 07-23-1955, MRN [id number]  PCP:  Darreld Mclean, MD   [facility] Cardiologist:  Diem Dicocco   Referring MD: Darreld Mclean, MD   Chief Complaint  Patient presents with   carotid artery disease     History of Present Illness: 12/06/21   Rakesha Dalporto Hayes is a 66 y.o. female with a hx of OSA, anxiety.  She was recently at the dentist and the xrays revealed some calcification of the carotid arteries.    Also has had some chest pain last week. No previous stroke symptoms No syncope, presyncope  Gets some exercise, not enough Does her normal chores without any cp , dyspnea  A week ago  She work up with CP , ( right upper chest pain 0  Got up out of bed, walked aroud a bit Last 5-6 hours  Went to the ER ,tightness No radiation to jaw or arm, Worse when she leaned over to reach to pick up something with her right arm   Workup in the emergency room was unremarkable.  Her troponins were 3, 3.  EKG revealed some nonspecific changes but nothing acute.    Past Medical History:  Diagnosis Date   Anemia    Anxiety    Arthritis    Chest pain    Depression    Diverticulosis    GERD (gastroesophageal reflux disease)    tums only PRN   History of diverticulitis    Kidney stone    Ovarian cyst    Prediabetes    Recurrent UTI    Sleep apnea    no CPAP    Past Surgical History:  Procedure Laterality Date   BLADDER SUSPENSION N/A 04/24/2013   Procedure: TRANSVAGINAL TAPE (TVT) Exact PROCEDURE;  Surgeon: Elveria Royals, MD;  Location: [facility];  Service: Gynecology;  Laterality: N/A;   CARPAL TUNNEL RELEASE Bilateral 1997   CATARACT EXTRACTION, BILATERAL     COLONOSCOPY     CYSTOSCOPY N/A 04/24/2013   Procedure: CYSTOSCOPY;  Surgeon: Elveria Royals, MD;  Location: [facility];  Service: Gynecology;  Laterality: N/A;   GIVENS CAPSULE STUDY N/A 10/21/2017   Procedure: GIVENS CAPSULE  STUDY;  Surgeon: Doran Stabler, MD;  Location: [facility];  Service: Gastroenterology;  Laterality: N/A;   LAPAROSCOPY Bilateral 05/03/2014   Procedure: LAPAROSCOPIC BILATERAL SALPINGO OOPHORECTOMY;  Surgeon: Lyman Speller, MD;  Location: [facility];  Service: Gynecology;  Laterality: Bilateral;   MOUTH SURGERY     TOTAL KNEE ARTHROPLASTY Bilateral 04/2010    Current Medications: Current Meds  Medication Sig   ALPRAZolam (XANAX) 0.5 MG tablet Take 1 tablet by mouth at bedtime as needed for anxiety.    Desvenlafaxine ER (PRISTIQ) 50 MG TB24 Take 100 mg by mouth daily.     Allergies:   Trintellix [vortioxetine]   Social History   Socioeconomic History   Marital status: Married    Spouse name: Not on file   Number of children: 2   Years of education: Not on file   Highest education level: Not on file  Occupational History   Not on file  Tobacco Use   Smoking status: Former    Packs/day: 0.50    Types: Cigarettes    Quit date: 10/28/2016    Years since quitting: 5.1   Smokeless tobacco: Never   Tobacco comments:    1/2 PPD  Vaping Use  Vaping Use: Never used  Substance and Sexual Activity   Alcohol use: Not Currently   Drug use: No   Sexual activity: Not Currently    Partners: Male    Birth control/protection: Abstinence  Other Topics Concern   Not on file  Social History Narrative   Not on file   Social Determinants of Health   Financial Resource Strain: Low Risk  (09/04/2021)   Overall Financial Resource Strain (CARDIA)    Difficulty of Paying Living Expenses: Not hard at all  Food Insecurity: No Food Insecurity (09/04/2021)   Hunger Vital Sign    Worried About Running Out of Food in the Last Year: Never true    Ran Out of Food in the Last Year: Never true  Transportation Needs: No Transportation Needs (09/04/2021)   PRAPARE - Hydrologist (Medical): No    Lack of Transportation (Non-Medical): No  Physical Activity: Sufficiently  Active (09/04/2021)   Exercise Vital Sign    Days of Exercise per Week: 7 days    Minutes of Exercise per Session: 40 min  Stress: No Stress Concern Present (09/04/2021)   [place]    Feeling of Stress : Not at all  Social Connections: [place] (09/04/2021)   Social Connection and Isolation Panel [NHANES]    Frequency of Communication with Friends and Family: More than three times a week    Frequency of Social Gatherings with Friends and Family: More than three times a week    Attends Religious Services: More than 4 times per year    Active Member of [employer] or Organizations: Yes    Attends Music therapist: More than 4 times per year    Marital Status: Married     Family History: The patient's family history includes Alcohol abuse in her father and maternal grandfather; COPD in her father; Cancer in her maternal grandmother; Depression in her maternal grandfather; Early death in her mother; Heart attack in her father; Heart disease in her paternal grandmother; Multiple myeloma in her mother. There is no history of Colon cancer, Stomach cancer, Esophageal cancer, or Rectal cancer.  ROS:   Please see the history of present illness.     All other systems reviewed and are negative.  EKGs/Labs/Other Studies Reviewed:    The following studies were reviewed today:   EKG: November 30, 2021: Normal sinus rhythm.  No ST or T wave changes.  Nonspecific tiny Q waves in the inferior leads.  Recent Labs: 03/14/2021: ALT 15 11/30/2021: BUN 18; Creatinine, Ser 0.93; Hemoglobin 11.7; Platelets 329; Potassium 3.9; Sodium 139  Recent Lipid Panel    Component Value Date/Time   CHOL 180 09/07/2019 1417   CHOL 216 (H) 02/01/2017 1504   TRIG 125.0 09/07/2019 1417   HDL 57.20 09/07/2019 1417   HDL 78 02/01/2017 1504   CHOLHDL 3 09/07/2019 1417   VLDL 25.0 09/07/2019 1417   LDLCALC 98 09/07/2019 1417   LDLCALC 119 (H)  02/01/2017 1504     Risk Assessment/Calculations:           Physical Exam:    VS:  BP 126/80   Pulse 80   Ht '5\' 7"'  (1.702 m)   Wt 201 lb 9.6 oz (91.4 kg)   LMP 01/28/2010   SpO2 98%   BMI 31.58 kg/m     Wt Readings from Last 3 Encounters:  12/06/21 201 lb 9.6 oz (91.4 kg)  11/30/21 200  lb (90.7 kg)  09/28/21 199 lb 6.4 oz (90.4 kg)     GEN:  Well nourished, well developed in no acute distress HEENT: Normal NECK: No JVD; No carotid bruits LYMPHATICS: No lymphadenopathy CARDIAC: RRR, no murmurs, rubs, gallops RESPIRATORY:  Clear to auscultation without rales, wheezing or rhonchi  ABDOMEN: Soft, non-tender, non-distended MUSCULOSKELETAL:  No edema; No deformity  SKIN: Warm and dry NEUROLOGIC:  Alert and oriented x 3 PSYCHIATRIC:  Normal affect   ASSESSMENT:    No diagnosis found. PLAN:      1.  Carotid calcification: She was noted to have "carotid calcification on a dental x-ray recently.  I do not hear any bruits.  She has not had any neurologic symptoms.  I do not think she needs carotid duplex scan at this time.  2.  Chest pain: She had an episode of chest pain last week which brought her to the ER.  She has very minimal elevation of her LDL.  I like to do a coronary CT angiogram for further evaluation of this episode of chest discomfort.  Based on her coronary CT angiogram will be able to do to determine how aggressive we need to be with lipid lowering. Go ahead and start her on rosuvastatin 20 mg a day.  Will see her back in the office in 3 months for follow-up visit and check lipids, ALT, basic metabolic profile at that time.       {Are you ordering a CV Procedure (e.g. stress test, cath, DCCV, TEE, etc)?   Press F2        :[id number]}    Medication Adjustments/Labs and Tests Ordered: Current medicines are reviewed at length with the patient today.  Concerns regarding medicines are outlined above.  No orders of the defined types were placed in this  encounter.  No orders of the defined types were placed in this encounter.   ***  There are no Patient Instructions on file for this visit.   Signed, Mertie Moores, MD  12/06/2021 12:46 PM    [place]

## 2021-12-05 NOTE — Telephone Encounter (Signed)
Pt would like a callback to discuss seeing Dr. Sharlet Salina. She is a pt of Jessica Copland at [facility] at [facility]. Pt was seen in ED for chest pain on 11/30/21. They offered follow-up with cardiology, but pt preferred to follow up with PCP. Pt is now needing a referral to cardiology and she stated she was told the referral can only be sent from an internist. Pt states Janett Billow Copland practices family medicine and is not an internist. Pt would like to know if Dr. Sharlet Salina will see her for the referral. Pt stated she is not trying to leave Dr. Lorelei Pont, she just needs to see Dr. Sharlet Salina for the referral.   Please advise.

## 2021-12-05 NOTE — Telephone Encounter (Signed)
Called pt. LDVM letting the patient know that she will need to get her cardiology referral placed by Dr. Lorelei Pont.

## 2021-12-06 ENCOUNTER — Ambulatory Visit (INDEPENDENT_AMBULATORY_CARE_PROVIDER_SITE_OTHER): Payer: Medicare Other | Admitting: Cardiovascular Disease

## 2021-12-06 VITALS — BP 126/80 | HR 80 | Ht 67.0 in | Wt 201.6 lb

## 2021-12-06 DIAGNOSIS — R072 Precordial pain: Secondary | ICD-10-CM

## 2021-12-06 DIAGNOSIS — R079 Chest pain, unspecified: Secondary | ICD-10-CM

## 2021-12-06 MED ORDER — METOPROLOL TARTRATE 100 MG PO TABS: 100.0000 mg | ORAL_TABLET | Freq: Once | ORAL | 0 refills | Status: DC

## 2021-12-06 MED ORDER — ROSUVASTATIN CALCIUM 20 MG PO TABS: 20.0000 mg | ORAL_TABLET | Freq: Every day | ORAL | 3 refills | Status: DC

## 2021-12-06 NOTE — Patient Instructions (Addendum)
Medication Instructions:  Crestor (rosuvastatin ) '20mg'$  daily *If you need a refill on your cardiac medications before your next appointment, please call your pharmacy*   Lab Work: Bmet, lipids, ALP in 3 months  If you have labs (blood work) drawn today and your tests are completely normal, you will receive your results only by: [place] (if you have MyChart) OR A paper copy in the mail If you have any lab test that is abnormal or we need to change your treatment, we will call you to review the results.   Testing/Procedures: Your physician has requested that you have cardiac CT. Cardiac computed tomography (CT) is a painless test that uses an x-ray machine to take clear, detailed pictures of your heart. For further information please visit [email or web address]. Please follow instruction sheet as given.     Follow-Up: At [facility], you and your health needs are our priority.  As part of our continuing mission to provide you with exceptional heart care, we have created designated Provider Care Teams.  These Care Teams include your primary Cardiologist (physician) and Advanced Practice Providers (APPs -  Physician Assistants and Nurse Practitioners) who all work together to provide you with the care you need, when you need it.  Your next appointment:   3 month(s)  The format for your next appointment:   In Person  Provider:  Dr Acie Fredrickson   Other Instructions   Your cardiac CT will be scheduled at one of the below locations:   [facility] [address] [place], [place] [address] [phone]   If scheduled at [facility], please arrive at the [facility] and Children's Entrance (Entrance C2) of [facility] 30 minutes prior to test start time. You can use the FREE valet parking offered at entrance C (encouraged to control the heart rate for the test)  Proceed to the [facility] Radiology Department (first floor) to check-in and test prep.  All  radiology patients and guests should use entrance C2 at [facility], accessed from [facility], even though the hospital's physical address listed is [address]     Please follow these instructions carefully (unless otherwise directed):  On the Night Before the Test: Be sure to Drink plenty of water. Do not consume any caffeinated/decaffeinated beverages or chocolate 12 hours prior to your test. Do not take any antihistamines 12 hours prior to your test.  On the Day of the Test: Drink plenty of water until 1 hour prior to the test. Do not eat any food 4 hours prior to the test. You may take your regular medications prior to the test.  Take metoprolol (Lopressor) two hours prior to test. HOLD Furosemide/Hydrochlorothiazide morning of the test. FEMALES- please wear underwire-free bra if available, avoid dresses & tight clothing      After the Test: Drink plenty of water. After receiving IV contrast, you may experience a mild flushed feeling. This is normal. On occasion, you may experience a mild rash up to 24 hours after the test. This is not dangerous. If this occurs, you can take Benadryl 25 mg and increase your fluid intake. If you experience trouble breathing, this can be serious. If it is severe call 911 IMMEDIATELY. If it is mild, please call our office. If you take any of these medications: Glipizide/Metformin, Avandament, Glucavance, please do not take 48 hours after completing test unless otherwise instructed.  We will call to schedule your test 2-4 weeks out understanding that  some insurance companies will need an authorization prior to the service being performed.   For non-scheduling related questions, please contact the cardiac imaging nurse navigator should you have any questions/concerns: Marchia Bond, Cardiac Imaging Nurse Navigator Gordy Clement, Cardiac Imaging Nurse Navigator [facility] Direct Office Dial:  [phone]   For scheduling needs, including cancellations and rescheduling, please call [place], [phone].

## 2021-12-26 ENCOUNTER — Telehealth (HOSPITAL_COMMUNITY): Payer: Self-pay | Admitting: Emergency Medicine

## 2021-12-26 NOTE — Telephone Encounter (Signed)
Attempted to call patient regarding upcoming cardiac CT appointment. °Left message on voicemail with name and callback number °Geordan Xu RN Navigator Cardiac Imaging °[facility] °[phone] Office °[phone] Cell ° °

## 2021-12-26 NOTE — Telephone Encounter (Signed)
Reaching out to patient to offer assistance regarding upcoming cardiac imaging study; pt verbalizes understanding of appt date/time, parking situation and where to check in, pre-test NPO status and medications ordered, and verified current allergies; name and call back number provided for further questions should they arise Marchia Bond RN Navigator Cardiac Imaging [facility] [phone] office [phone] cell   Denies iv issues '100mg'$  metoprolol tart Arrival 200 w/c entrance

## 2021-12-27 ENCOUNTER — Ambulatory Visit (HOSPITAL_COMMUNITY)
Admission: RE | Admit: 2021-12-27 | Discharge: 2021-12-27 | Disposition: A | Discharge status: Home / Self Care | Payer: Medicare Other | Source: Ambulatory Visit | Attending: Cardiovascular Disease | Admitting: Cardiovascular Disease

## 2021-12-27 DIAGNOSIS — R072 Precordial pain: Secondary | ICD-10-CM | POA: Insufficient documentation

## 2021-12-27 MED ORDER — IOHEXOL 350 MG/ML SOLN
100.0000 mL | Freq: Once | INTRAVENOUS | Status: AC | PRN
  Administered 2021-12-27: 100 mL via INTRAVENOUS

## 2021-12-27 MED ORDER — NITROGLYCERIN 0.4 MG SL SUBL
SUBLINGUAL_TABLET | SUBLINGUAL | Status: AC
  Filled 2021-12-27: qty 2

## 2021-12-27 MED ORDER — NITROGLYCERIN 0.4 MG SL SUBL
0.8000 mg | SUBLINGUAL_TABLET | Freq: Once | SUBLINGUAL | Status: AC
  Administered 2021-12-27: 0.8 mg via SUBLINGUAL

## 2021-12-28 ENCOUNTER — Telehealth (HOSPITAL_BASED_OUTPATIENT_CLINIC_OR_DEPARTMENT_OTHER): Payer: Self-pay | Admitting: Obstetrics & Gynecology

## 2021-12-28 NOTE — Telephone Encounter (Signed)
Patient called and would like for the nurse to please give her call.

## 2021-12-29 NOTE — Telephone Encounter (Signed)
LMOVM returning pts call.  

## 2022-01-04 NOTE — Telephone Encounter (Signed)
LMOVM for pt to call office if she still needs to talk with nurse.

## 2022-02-21 ENCOUNTER — Emergency Department (HOSPITAL_BASED_OUTPATIENT_CLINIC_OR_DEPARTMENT_OTHER)
Admission: EM | Admit: 2022-02-21 | Discharge: 2022-02-21 | Disposition: A | Discharge status: Home / Self Care | Payer: Medicare Other | Attending: Emergency Medicine | Admitting: Emergency Medicine

## 2022-02-21 ENCOUNTER — Emergency Department (HOSPITAL_BASED_OUTPATIENT_CLINIC_OR_DEPARTMENT_OTHER): Payer: Medicare Other

## 2022-02-21 ENCOUNTER — Other Ambulatory Visit: Payer: Self-pay

## 2022-02-21 ENCOUNTER — Encounter (HOSPITAL_BASED_OUTPATIENT_CLINIC_OR_DEPARTMENT_OTHER): Payer: Self-pay

## 2022-02-21 DIAGNOSIS — R1031 Right lower quadrant pain: Secondary | ICD-10-CM | POA: Insufficient documentation

## 2022-02-21 DIAGNOSIS — Z96653 Presence of artificial knee joint, bilateral: Secondary | ICD-10-CM | POA: Diagnosis not present

## 2022-02-21 DIAGNOSIS — Z87891 Personal history of nicotine dependence: Secondary | ICD-10-CM | POA: Insufficient documentation

## 2022-02-21 LAB — COMPREHENSIVE METABOLIC PANEL
ALT: 13 U/L (ref 0–44)
AST: 13 U/L — ABNORMAL LOW (ref 15–41)
Albumin: 4.3 g/dL (ref 3.5–5.0)
Alkaline Phosphatase: 81 U/L (ref 38–126)
Anion gap: 8 (ref 5–15)
BUN: 15 mg/dL (ref 8–23)
CO2: 25 mmol/L (ref 22–32)
Calcium: 9.5 mg/dL (ref 8.9–10.3)
Chloride: 106 mmol/L (ref 98–111)
Creatinine, Ser: 0.83 mg/dL (ref 0.44–1.00)
GFR, Estimated: >60 mL/min (ref >60)
Glucose, Bld: 107 mg/dL — ABNORMAL HIGH (ref 70–99)
Potassium: 3.5 mmol/L (ref 3.5–5.1)
Sodium: 139 mmol/L (ref 135–145)
Total Bilirubin: 0.6 mg/dL (ref 0.3–1.2)
Total Protein: 6.9 g/dL (ref 6.5–8.1)

## 2022-02-21 LAB — CBC
HCT: 39.4 % (ref 36.0–46.0)
Hemoglobin: 12.5 g/dL (ref 12.0–15.0)
MCH: 25.8 pg — ABNORMAL LOW (ref 26.0–34.0)
MCHC: 31.7 g/dL (ref 30.0–36.0)
MCV: 81.4 fL (ref 80.0–100.0)
Platelets: 216 10*3/uL (ref 150–400)
RBC: 4.84 MIL/uL (ref 3.87–5.11)
RDW: 16.4 % — ABNORMAL HIGH (ref 11.5–15.5)
WBC: 7.5 10*3/uL (ref 4.0–10.5)
nRBC: 0 % (ref 0.0–0.2)

## 2022-02-21 LAB — URINALYSIS, ROUTINE W REFLEX MICROSCOPIC
Bilirubin Urine: NEGATIVE
Glucose, UA: NEGATIVE mg/dL
Ketones, ur: NEGATIVE mg/dL
Leukocytes,Ua: NEGATIVE
Nitrite: NEGATIVE
Protein, ur: 30 mg/dL — AB
Specific Gravity, Urine: >1.046 — ABNORMAL HIGH (ref 1.005–1.030)
pH: 7.5 (ref 5.0–8.0)

## 2022-02-21 LAB — LIPASE, BLOOD: Lipase: 32 U/L (ref 11–51)

## 2022-02-21 MED ORDER — IOHEXOL 300 MG/ML  SOLN
100.0000 mL | Freq: Once | INTRAMUSCULAR | Status: AC | PRN
  Administered 2022-02-21: 80 mL via INTRAVENOUS

## 2022-02-21 NOTE — ED Triage Notes (Signed)
Right side abdominal pain starting about 6 hours prior to arrival.  Denies N/V/D.

## 2022-02-21 NOTE — ED Provider Notes (Signed)
[facility] Emergency Department Provider Note MRN:  [id number]  Arrival date & time: 02/21/22     Chief Complaint   Abdominal Pain   History of Present Illness   Jasmine [name] is a 66 y.o. year-old female with a history of diverticulosis presenting to the ED with chief complaint of abdominal pain.  Right lower quadrant abdominal pain for the past 6 hours.  Constant, worse with moving, does not really hurt when laying still.  No fever, no nausea vomiting or diarrhea, no constipation, no chest pain or shortness of breath, no other complaints.  Review of Systems  A thorough review of systems was obtained and all systems are negative except as noted in the HPI and PMH.   Patient's Health History    Past Medical History:  Diagnosis Date   Anemia    Anxiety    Arthritis    Chest pain    Depression    Diverticulosis    GERD (gastroesophageal reflux disease)    tums only PRN   History of diverticulitis    Kidney stone    Ovarian cyst    Prediabetes    Recurrent UTI    Sleep apnea    no CPAP    Past Surgical History:  Procedure Laterality Date   BLADDER SUSPENSION N/A 04/24/2013   Procedure: TRANSVAGINAL TAPE (TVT) Exact PROCEDURE;  Surgeon: Elveria Royals, MD;  Location: [facility];  Service: Gynecology;  Laterality: N/A;   CARPAL TUNNEL RELEASE Bilateral 1997   CATARACT EXTRACTION, BILATERAL     COLONOSCOPY     CYSTOSCOPY N/A 04/24/2013   Procedure: CYSTOSCOPY;  Surgeon: Elveria Royals, MD;  Location: [facility];  Service: Gynecology;  Laterality: N/A;   GIVENS CAPSULE STUDY N/A 10/21/2017   Procedure: GIVENS CAPSULE STUDY;  Surgeon: Doran Stabler, MD;  Location: [facility];  Service: Gastroenterology;  Laterality: N/A;   LAPAROSCOPY Bilateral 05/03/2014   Procedure: LAPAROSCOPIC BILATERAL SALPINGO OOPHORECTOMY;  Surgeon: Lyman Speller, MD;  Location: [facility];  Service: Gynecology;  Laterality: Bilateral;   MOUTH SURGERY     TOTAL KNEE  ARTHROPLASTY Bilateral 04/2010    Family History  Problem Relation Age of Onset   Multiple myeloma Mother    Early death Mother    Heart attack Father    Alcohol abuse Father    COPD Father    Cancer Maternal Grandmother    Alcohol abuse Maternal Grandfather    Depression Maternal Grandfather    Heart disease Paternal Grandmother    Colon cancer Neg Hx    Stomach cancer Neg Hx    Esophageal cancer Neg Hx    Rectal cancer Neg Hx     Social History   Socioeconomic History   Marital status: Married    Spouse name: Not on file   Number of children: 2   Years of education: Not on file   Highest education level: Not on file  Occupational History   Not on file  Tobacco Use   Smoking status: Former    Packs/day: 0.50    Types: Cigarettes    Quit date: 10/28/2016    Years since quitting: 5.3   Smokeless tobacco: Never   Tobacco comments:    1/2 PPD  Vaping Use   Vaping Use: Never used  Substance and Sexual Activity   Alcohol use: Not Currently   Drug use: No   Sexual activity: Not Currently    Partners: Male    Birth control/protection:  Abstinence  Other Topics Concern   Not on file  Social History Narrative   Not on file   Social Determinants of Health   Financial Resource Strain: Low Risk  (09/04/2021)   Overall Financial Resource Strain (CARDIA)    Difficulty of Paying Living Expenses: Not hard at all  Food Insecurity: No Food Insecurity (09/04/2021)   Hunger Vital Sign    Worried About Running Out of Food in the Last Year: Never true    Ran Out of Food in the Last Year: Never true  Transportation Needs: No Transportation Needs (09/04/2021)   PRAPARE - Hydrologist (Medical): No    Lack of Transportation (Non-Medical): No  Physical Activity: Sufficiently Active (09/04/2021)   Exercise Vital Sign    Days of Exercise per Week: 7 days    Minutes of Exercise per Session: 40 min  Stress: No Stress Concern Present (09/04/2021)   [place]    Feeling of Stress : Not at all  Social Connections: [place] (09/04/2021)   Social Connection and Isolation Panel [NHANES]    Frequency of Communication with Friends and Family: More than three times a week    Frequency of Social Gatherings with Friends and Family: More than three times a week    Attends Religious Services: More than 4 times per year    Active Member of [employer] or Organizations: Yes    Attends Music therapist: More than 4 times per year    Marital Status: Married  Human resources officer Violence: Not At Risk (09/04/2021)   Humiliation, Afraid, Rape, and Kick questionnaire    Fear of Current or Ex-Partner: No    Emotionally Abused: No    Physically Abused: No    Sexually Abused: No     Physical Exam   Vitals:   02/21/22 0511 02/21/22 0600  BP: 122/77 (!) 118/100  Pulse: 71 75  Resp: 20 17  Temp:    SpO2: 98% 96%    CONSTITUTIONAL: Well-appearing, NAD NEURO/PSYCH:  Alert and oriented x 3, no focal deficits EYES:  eyes equal and reactive ENT/NECK:  no LAD, no JVD CARDIO: Regular rate, well-perfused, normal S1 and S2 PULM:  CTAB no wheezing or rhonchi GI/GU:  non-distended, non-tender MSK/SPINE:  No gross deformities, no edema SKIN:  no rash, atraumatic   *Additional and/or pertinent findings included in MDM below  Diagnostic and Interventional Summary    EKG Interpretation  Date/Time:  Wednesday February 21 2022 04:04:12 EDT Ventricular Rate:  81 PR Interval:  183 QRS Duration: 87 QT Interval:  388 QTC Calculation: 451 R Axis:   109 Text Interpretation: Sinus rhythm Right axis deviation Low voltage, precordial leads Borderline T abnormalities, anterior leads Confirmed by Gerlene Fee [phone]) on 02/21/2022 6:12:32 AM       Labs Reviewed  COMPREHENSIVE METABOLIC PANEL - Abnormal; Notable for the following components:      Result Value   Glucose, Bld 107 (*)     AST 13 (*)    All other components within normal limits  CBC - Abnormal; Notable for the following components:   MCH 25.8 (*)    RDW 16.4 (*)    All other components within normal limits  URINALYSIS, ROUTINE W REFLEX MICROSCOPIC - Abnormal; Notable for the following components:   Specific Gravity, Urine >1.046 (*)    Hgb urine dipstick TRACE (*)    Protein, ur 30 (*)  Bacteria, UA RARE (*)    All other components within normal limits  LIPASE, BLOOD    CT ABDOMEN PELVIS W CONTRAST  Final Result      Medications  iohexol (OMNIPAQUE) 300 MG/ML solution 100 mL (80 mLs Intravenous Contrast Given 02/21/22 0450)     Procedures  /  Critical Care Procedures  ED Course and Medical Decision Making  Initial Impression and Ddx Persistent right lower quadrant abdominal pain, considering appendicitis, right-sided diverticulitis, kidney stone, pyelonephritis, colitis, awaiting CT, labs.  Past medical/surgical history that increases complexity of ED encounter: Diverticulitis  Interpretation of Diagnostics I personally reviewed the laboratory assessment and my interpretation is as follows: No significant blood count or electrolyte disturbance, reassuring kidney and liver function, urinalysis unremarkable.  CT scan was without acute process.  Patient Reassessment and Ultimate Disposition/Management     Patient continues to look and feel well with normal vital signs, appropriate for discharge as there is nothing to suggest emergent process.  Patient management required discussion with the following services or consulting groups:  None  Complexity of Problems Addressed Acute illness or injury that poses threat of life of bodily function  Additional Data Reviewed and Analyzed Further history obtained from: Further history from spouse/family member  Additional Factors Impacting ED Encounter Risk None  Barth Kirks. [place] Small, [place] [email or web address]  Final Clinical Impressions(s) / ED Diagnoses     ICD-10-CM   1. Right lower quadrant abdominal pain  R10.31       ED Discharge Orders     None        Discharge Instructions Discussed with and Provided to Patient:     Discharge Instructions      You were evaluated in the Emergency Department and after careful evaluation, we did not find any emergent condition requiring admission or further testing in the hospital.  Your exam/testing today was overall reassuring.  Your pain may be muscular in nature, recommend Tylenol or Motrin as needed for pain and follow-up with primary care doctor if pain continues throughout the week.  Please return to the Emergency Department if you experience any worsening of your condition.  Thank you for allowing [name] to be a part of your care.        Maudie Flakes, MD 02/21/22 [phone]

## 2022-02-21 NOTE — Discharge Instructions (Signed)
You were evaluated in the Emergency Department and after careful evaluation, we did not find any emergent condition requiring admission or further testing in the hospital.  Your exam/testing today was overall reassuring.  Your pain may be muscular in nature, recommend Tylenol or Motrin as needed for pain and follow-up with primary care doctor if pain continues throughout the week.  Please return to the Emergency Department if you experience any worsening of your condition.  Thank you for allowing [name] to be a part of your care.

## 2022-02-28 ENCOUNTER — Other Ambulatory Visit: Payer: PRIVATE HEALTH INSURANCE

## 2022-02-28 ENCOUNTER — Ambulatory Visit: Payer: Medicare Other | Attending: Cardiovascular Disease

## 2022-02-28 LAB — BASIC METABOLIC PANEL
BUN/Creatinine Ratio: 15 (ref 12–28)
BUN: 14 mg/dL (ref 8–27)
CO2: 25 mmol/L (ref 20–29)
Calcium: 9.7 mg/dL (ref 8.7–10.3)
Chloride: 104 mmol/L (ref 96–106)
Creatinine, Ser: 0.92 mg/dL (ref 0.57–1.00)
Glucose: 117 mg/dL — ABNORMAL HIGH (ref 70–99)
Potassium: 3.8 mmol/L (ref 3.5–5.2)
Sodium: 141 mmol/L (ref 134–144)
eGFR: 68 mL/min/{1.73_m2} (ref >59)

## 2022-02-28 LAB — LIPID PANEL
Chol/HDL Ratio: 2.1 ratio (ref 0.0–4.4)
Cholesterol, Total: 142 mg/dL (ref 100–199)
HDL: 68 mg/dL (ref >39)
LDL Chol Calc (NIH): 59 mg/dL (ref 0–99)
Triglycerides: 79 mg/dL (ref 0–149)
VLDL Cholesterol Cal: 15 mg/dL (ref 5–40)

## 2022-02-28 LAB — ALT: ALT: 15 IU/L (ref 0–32)

## 2022-03-05 NOTE — Progress Notes (Unsigned)
Cardiology Office Note:    Date:  03/06/2022   ID:  Jasmine [name], DOB [birth year], MRN [id number]  PCP:  Darreld Mclean, MD   [place] Providers Cardiologist:  Darric Plante   Referring MD: Darreld Mclean, MD   Chief Complaint  Patient presents with   carotid artery disease      History of Present Illness: 12/06/21   Jasmine [name] is a 66 y.o. female with a hx of OSA, anxiety.  She was recently at the dentist and the xrays revealed some calcification of the carotid arteries.    Also has had some chest pain last week. No previous stroke symptoms No syncope, presyncope  Gets some exercise, not enough Does her normal chores without any cp , dyspnea  A week ago  She work up with CP , ( right upper chest pain )  Got up out of bed, walked aroud a bit Last 5-6 hours  Went to the ER ,tightness No radiation to jaw or arm, Worse when she leaned over to reach to pick up something with her right arm   Workup in the emergency room was unremarkable.  Her troponins were 3, 3.  EKG revealed some nonspecific changes but nothing acute.   Nov. 7, 2023   Jasmine [name] is see for follow up of her carotid artery calcification and chest pain  Is not exercising much   Coronary calcium score is 0. She had mild plaque in her LAD.  Otherwise her coronary arteries are unremarkable.  LDL was 59 ( nov. 1, 2023)      Past Medical History:  Diagnosis Date   Anemia    Anxiety    Arthritis    Chest pain    Depression    Diverticulosis    GERD (gastroesophageal reflux disease)    tums only PRN   History of diverticulitis    Kidney stone    Ovarian cyst    Prediabetes    Recurrent UTI    Sleep apnea    no CPAP    Past Surgical History:  Procedure Laterality Date   BLADDER SUSPENSION N/A 04/24/2013   Procedure: TRANSVAGINAL TAPE (TVT) Exact PROCEDURE;  Surgeon: Elveria Royals, MD;  Location: [facility];  Service: Gynecology;  Laterality: N/A;   CARPAL TUNNEL RELEASE  Bilateral 1997   CATARACT EXTRACTION, BILATERAL     COLONOSCOPY     CYSTOSCOPY N/A 04/24/2013   Procedure: CYSTOSCOPY;  Surgeon: Elveria Royals, MD;  Location: [facility];  Service: Gynecology;  Laterality: N/A;   GIVENS CAPSULE STUDY N/A 10/21/2017   Procedure: GIVENS CAPSULE STUDY;  Surgeon: Doran Stabler, MD;  Location: [facility];  Service: Gastroenterology;  Laterality: N/A;   LAPAROSCOPY Bilateral 05/03/2014   Procedure: LAPAROSCOPIC BILATERAL SALPINGO OOPHORECTOMY;  Surgeon: Lyman Speller, MD;  Location: [facility];  Service: Gynecology;  Laterality: Bilateral;   MOUTH SURGERY     TOTAL KNEE ARTHROPLASTY Bilateral 04/2010    Current Medications: Current Meds  Medication Sig   ALPRAZolam (XANAX) 0.5 MG tablet Take 1 tablet by mouth at bedtime as needed for anxiety.    ARIPiprazole (ABILIFY) 5 MG tablet Take 5 mg by mouth every morning.   desvenlafaxine (PRISTIQ) 100 MG 24 hr tablet Take 100 mg by mouth daily.   rosuvastatin (CRESTOR) 20 MG tablet Take 1 tablet (20 mg total) by mouth daily.   [DISCONTINUED] Desvenlafaxine ER (PRISTIQ) 50 MG TB24 Take 100 mg by mouth daily.  Allergies:   Trintellix [vortioxetine]   Social History   Socioeconomic History   Marital status: Married    Spouse name: Not on file   Number of children: 2   Years of education: Not on file   Highest education level: Not on file  Occupational History   Not on file  Tobacco Use   Smoking status: Former    Packs/day: 0.50    Types: Cigarettes    Quit date: 10/28/2016    Years since quitting: 5.3   Smokeless tobacco: Never   Tobacco comments:    1/2 PPD  Vaping Use   Vaping Use: Never used  Substance and Sexual Activity   Alcohol use: Not Currently   Drug use: No   Sexual activity: Not Currently    Partners: Male    Birth control/protection: Abstinence  Other Topics Concern   Not on file  Social History Narrative   Not on file   Social Determinants of Health   Financial  Resource Strain: Low Risk  (09/04/2021)   Overall Financial Resource Strain (CARDIA)    Difficulty of Paying Living Expenses: Not hard at all  Food Insecurity: No Food Insecurity (09/04/2021)   Hunger Vital Sign    Worried About Running Out of Food in the Last Year: Never true    Hope in the Last Year: Never true  Transportation Needs: No Transportation Needs (09/04/2021)   PRAPARE - Hydrologist (Medical): No    Lack of Transportation (Non-Medical): No  Physical Activity: Sufficiently Active (09/04/2021)   Exercise Vital Sign    Days of Exercise per Week: 7 days    Minutes of Exercise per Session: 40 min  Stress: No Stress Concern Present (09/04/2021)   [place]    Feeling of Stress : Not at all  Social Connections: [place] (09/04/2021)   Social Connection and Isolation Panel [NHANES]    Frequency of Communication with Friends and Family: More than three times a week    Frequency of Social Gatherings with Friends and Family: More than three times a week    Attends Religious Services: More than 4 times per year    Active Member of [employer] or Organizations: Yes    Attends Music therapist: More than 4 times per year    Marital Status: Married     Family History: The patient's family history includes Alcohol abuse in her father and maternal grandfather; COPD in her father; Cancer in her maternal grandmother; Depression in her maternal grandfather; Early death in her mother; Heart attack in her father; Heart disease in her paternal grandmother; Multiple myeloma in her mother. There is no history of Colon cancer, Stomach cancer, Esophageal cancer, or Rectal cancer.  ROS:   Please see the history of present illness.     All other systems reviewed and are negative.  EKGs/Labs/Other Studies Reviewed:    The following studies were reviewed today:   EKG: November 30, 2021:  Normal sinus rhythm.  No ST or T wave changes.  Nonspecific tiny Q waves in the inferior leads.  Recent Labs: 02/21/2022: Hemoglobin 12.5; Platelets 216 02/28/2022: ALT 15; BUN 14; Creatinine, Ser 0.92; Potassium 3.8; Sodium 141  Recent Lipid Panel    Component Value Date/Time   CHOL 142 02/28/2022 0818   TRIG 79 02/28/2022 0818   HDL 68 02/28/2022 0818   CHOLHDL 2.1 02/28/2022 0818   CHOLHDL 3 09/07/2019  1417   VLDL 25.0 09/07/2019 1417   LDLCALC 59 02/28/2022 0818     Risk Assessment/Calculations:           Physical Exam:    VS:  BP 120/80   Pulse 88   Ht _0  (1.702 m)   Wt 213 lb (96.6 kg)   LMP 01/28/2010   SpO2 95%   BMI 33.36 kg/m     Wt Readings from Last 3 Encounters:  03/06/22 213 lb (96.6 kg)  02/21/22 200 lb (90.7 kg)  12/06/21 201 lb 9.6 oz (91.4 kg)     GEN:  Well nourished, well developed in no acute distress HEENT: Normal NECK: No JVD; No carotid bruits LYMPHATICS: No lymphadenopathy CARDIAC: RRR, no murmurs, rubs, gallops RESPIRATORY:  Clear to auscultation without rales, wheezing or rhonchi  ABDOMEN: Soft, non-tender, non-distended MUSCULOSKELETAL:  No edema; No deformity  SKIN: Warm and dry NEUROLOGIC:  Alert and oriented x 3 PSYCHIATRIC:  Normal affect   ASSESSMENT:    1. Dyspnea on exertion    PLAN:      1.  Carotid calcification: She was noted to have "carotid calcification on a dental x-ray recently.   No carotid bruits.  This seems to be stable.  2.  Chest pain: She had an episode of chest pain last week which brought her to the ER.  She has very minimal elevation of her LDL.  Coronary calcium score 0.  She was found to have very mild plaque in her proximal LAD.  Overall this should not be causing any angina.  I would like to continue with aggressive lipid-lowering therapy with LDL goal of 55.  Her last LDL was 59.  Continue diet, exercise, weight loss.  3.  Shortness of breath with exertion.  We will get an echocardiogram.   She may have some diastolic dysfunction.  We will consider starting Jardiance for mildly elevated glucose levels and her diastolic dysfunction if present. .            Medication Adjustments/Labs and Tests Ordered: Current medicines are reviewed at length with the patient today.  Concerns regarding medicines are outlined above.  Orders Placed This Encounter  Procedures   ECHOCARDIOGRAM COMPLETE   No orders of the defined types were placed in this encounter.     Patient Instructions  Medication Instructions:  Your physician recommends that you continue on your current medications as directed. Please refer to the Current Medication list given to you today.  *If you need a refill on your cardiac medications before your next appointment, please call your pharmacy*  Lab Work: NONE If you have labs (blood work) drawn today and your tests are completely normal, you will receive your results only by: [place] (if you have MyChart) OR A paper copy in the mail If you have any lab test that is abnormal or we need to change your treatment, we will call you to review the results.  Testing/Procedures: ECHO Your physician has requested that you have an echocardiogram. Echocardiography is a painless test that uses sound waves to create images of your heart. It provides your doctor with information about the size and shape of your heart and how well your heart's chambers and valves are working. This procedure takes approximately one hour. There are no restrictions for this procedure. Please do NOT wear cologne, perfume, aftershave, or lotions (deodorant is allowed). Please arrive 15 minutes prior to your appointment time.  Follow-Up: At [facility], you and your  health needs are our priority.  As part of our continuing mission to provide you with exceptional heart care, we have created designated Provider Care Teams.  These Care Teams include your primary Cardiologist  (physician) and Advanced Practice Providers (APPs -  Physician Assistants and Nurse Practitioners) who all work together to provide you with the care you need, when you need it.  Your next appointment:   6 month(s)  The format for your next appointment:   In Person  Provider:   Mertie Moores, MD       Important Information About Sugar         Signed, Mertie Moores, MD  03/06/2022 2:42 PM    [place]

## 2022-03-06 ENCOUNTER — Ambulatory Visit: Payer: Medicare Other | Attending: Cardiovascular Disease | Admitting: Cardiovascular Disease

## 2022-03-06 ENCOUNTER — Encounter: Payer: Self-pay | Admitting: Cardiovascular Disease

## 2022-03-06 VITALS — BP 120/80 | HR 88 | Ht 67.0 in | Wt 213.0 lb

## 2022-03-06 DIAGNOSIS — R0609 Other forms of dyspnea: Secondary | ICD-10-CM | POA: Diagnosis present

## 2022-03-06 NOTE — Patient Instructions (Signed)
Medication Instructions:  Your physician recommends that you continue on your current medications as directed. Please refer to the Current Medication list given to you today.  *If you need a refill on your cardiac medications before your next appointment, please call your pharmacy*  Lab Work: NONE If you have labs (blood work) drawn today and your tests are completely normal, you will receive your results only by: [place] (if you have MyChart) OR A paper copy in the mail If you have any lab test that is abnormal or we need to change your treatment, we will call you to review the results.  Testing/Procedures: ECHO Your physician has requested that you have an echocardiogram. Echocardiography is a painless test that uses sound waves to create images of your heart. It provides your doctor with information about the size and shape of your heart and how well your heart's chambers and valves are working. This procedure takes approximately one hour. There are no restrictions for this procedure. Please do NOT wear cologne, perfume, aftershave, or lotions (deodorant is allowed). Please arrive 15 minutes prior to your appointment time.  Follow-Up: At [facility], you and your health needs are our priority.  As part of our continuing mission to provide you with exceptional heart care, we have created designated Provider Care Teams.  These Care Teams include your primary Cardiologist (physician) and Advanced Practice Providers (APPs -  Physician Assistants and Nurse Practitioners) who all work together to provide you with the care you need, when you need it.  Your next appointment:   6 month(s)  The format for your next appointment:   In Person  Provider:   Mertie Moores, MD       Important Information About Sugar

## 2022-03-21 ENCOUNTER — Other Ambulatory Visit (HOSPITAL_COMMUNITY): Payer: PRIVATE HEALTH INSURANCE

## 2022-03-26 ENCOUNTER — Telehealth (HOSPITAL_BASED_OUTPATIENT_CLINIC_OR_DEPARTMENT_OTHER): Payer: Self-pay | Admitting: Obstetrics & Gynecology

## 2022-03-26 ENCOUNTER — Ambulatory Visit (INDEPENDENT_AMBULATORY_CARE_PROVIDER_SITE_OTHER): Payer: Medicare Other | Admitting: *Deleted

## 2022-03-26 DIAGNOSIS — R350 Frequency of micturition: Secondary | ICD-10-CM

## 2022-03-26 DIAGNOSIS — R319 Hematuria, unspecified: Secondary | ICD-10-CM

## 2022-03-26 LAB — POCT URINALYSIS DIPSTICK
Appearance: NORMAL
Bilirubin, UA: NEGATIVE
Glucose, UA: NEGATIVE
Nitrite, UA: POSITIVE
Odor: ABNORMAL
Protein, UA: POSITIVE — AB
Spec Grav, UA: 1.025 (ref 1.010–1.025)
Urobilinogen, UA: 1 E.U./dL
pH, UA: 6.5 (ref 5.0–8.0)

## 2022-03-26 MED ORDER — NITROFURANTOIN MONOHYD MACRO 100 MG PO CAPS: 100.0000 mg | ORAL_CAPSULE | Freq: Two times a day (BID) | ORAL | 0 refills | Status: AC

## 2022-03-26 NOTE — Progress Notes (Signed)
Pt here with complaints of urinary frequency and urgency. Clean catch urine obtained. Urine dip shows positive nitrates and large blood. Urine sent for culture. Rx sent to pharmacy for treatment.

## 2022-03-26 NOTE — Telephone Encounter (Signed)
Pt feels that she has a UTI. She complains of urinary frequency and urgency. Pt provided with appt for nurse visit for urine culture.

## 2022-03-26 NOTE — Telephone Encounter (Signed)
Patient call and stated that she thinks she has a UTI would like to be seen .

## 2022-03-30 LAB — URINE CULTURE

## 2022-04-02 ENCOUNTER — Ambulatory Visit (INDEPENDENT_AMBULATORY_CARE_PROVIDER_SITE_OTHER): Payer: Medicare Other | Admitting: *Deleted

## 2022-04-02 ENCOUNTER — Telehealth (HOSPITAL_BASED_OUTPATIENT_CLINIC_OR_DEPARTMENT_OTHER): Payer: Self-pay | Admitting: Obstetrics & Gynecology

## 2022-04-02 DIAGNOSIS — R350 Frequency of micturition: Secondary | ICD-10-CM

## 2022-04-02 DIAGNOSIS — R319 Hematuria, unspecified: Secondary | ICD-10-CM

## 2022-04-02 LAB — POCT URINALYSIS DIPSTICK
Appearance: NORMAL
Bilirubin, UA: NEGATIVE
Glucose, UA: NEGATIVE
Nitrite, UA: POSITIVE
Protein, UA: POSITIVE — AB
Spec Grav, UA: 1.025 (ref 1.010–1.025)
Urobilinogen, UA: 2 E.U./dL — AB
pH, UA: 7 (ref 5.0–8.0)

## 2022-04-02 MED ORDER — SULFAMETHOXAZOLE-TRIMETHOPRIM 800-160 MG PO TABS: 1.0000 | ORAL_TABLET | Freq: Two times a day (BID) | ORAL | 0 refills | Status: AC

## 2022-04-02 NOTE — Telephone Encounter (Signed)
Patient called and said  she would like for the nurse or doctor to please call her.

## 2022-04-02 NOTE — Telephone Encounter (Signed)
Pt states that she is still experiencing urinary frequency and feeling that she has a UTI despite finishing antibiotics. Pt will come in an provide urine sample.

## 2022-04-02 NOTE — Progress Notes (Signed)
Pt here with continued UTI symptoms. Urine obtained for culture. Different antibiotic sent to pharmacy based off of culture from 03/26/22 and being resistant to antibiotic that was prescribed.

## 2022-04-04 ENCOUNTER — Telehealth: Payer: Self-pay

## 2022-04-04 NOTE — Telephone Encounter (Signed)
Pt called [facility] Triage  Wanted [facility] to know she just rescheduled her Echocardiogram Dr. Acie Fredrickson ordered for 05/04/2022 at 900 am.  This new appointment is not present in the system yet, and call came into Triage.   Message sent to Dr. Elmarie Shiley RN for heads up.  Pt told we appreciate the communication, and will follow up only if there are any questions or concerns.  Pt understood.

## 2022-04-06 LAB — URINE CULTURE

## 2022-04-17 ENCOUNTER — Ambulatory Visit (INDEPENDENT_AMBULATORY_CARE_PROVIDER_SITE_OTHER): Payer: Medicare Other

## 2022-04-17 ENCOUNTER — Other Ambulatory Visit (HOSPITAL_BASED_OUTPATIENT_CLINIC_OR_DEPARTMENT_OTHER): Payer: Self-pay | Admitting: Obstetrics & Gynecology

## 2022-04-17 DIAGNOSIS — R350 Frequency of micturition: Secondary | ICD-10-CM

## 2022-04-17 DIAGNOSIS — N390 Urinary tract infection, site not specified: Secondary | ICD-10-CM

## 2022-04-17 DIAGNOSIS — R8290 Unspecified abnormal findings in urine: Secondary | ICD-10-CM

## 2022-04-17 LAB — POCT URINALYSIS DIPSTICK
Glucose, UA: NEGATIVE
Ketones, UA: NEGATIVE
Nitrite, UA: NEGATIVE
Protein, UA: POSITIVE — AB
Spec Grav, UA: >=1.03 — AB (ref 1.010–1.025)
Urobilinogen, UA: 0.2 E.U./dL
pH, UA: 5.5 (ref 5.0–8.0)

## 2022-04-17 MED ORDER — AMOXICILLIN-POT CLAVULANATE 500-125 MG PO TABS: 1.0000 | ORAL_TABLET | Freq: Two times a day (BID) | ORAL | 0 refills | Status: DC

## 2022-04-17 NOTE — Progress Notes (Signed)
Patient came in today to give a repeat urine sample. Patient was positive for leukocytes in last urine sample collected on 04/02/2022. Urine collected, evaluated and sent for culture. tbw

## 2022-04-19 LAB — URINE CULTURE

## 2022-05-04 ENCOUNTER — Ambulatory Visit (HOSPITAL_COMMUNITY): Payer: Medicare Other | Attending: Cardiovascular Disease

## 2022-05-04 DIAGNOSIS — R0609 Other forms of dyspnea: Secondary | ICD-10-CM | POA: Diagnosis not present

## 2022-05-04 LAB — ECHOCARDIOGRAM COMPLETE
Area-P 1/2: 3.08 cm2
S' Lateral: 1.7 cm

## 2022-05-09 ENCOUNTER — Other Ambulatory Visit: Payer: Self-pay | Admitting: Urology

## 2022-05-16 NOTE — Patient Instructions (Signed)
DUE TO COVID-19 ONLY TWO VISITORS  (aged 68 and older)  ARE ALLOWED TO COME WITH YOU AND STAY IN THE WAITING ROOM ONLY DURING PRE OP AND PROCEDURE.   **NO VISITORS ARE ALLOWED IN THE SHORT STAY AREA OR RECOVERY ROOM!!**  IF YOU WILL BE ADMITTED INTO THE HOSPITAL YOU ARE ALLOWED ONLY FOUR SUPPORT PEOPLE DURING VISITATION HOURS ONLY (7 AM -8PM)   The support person(s) must pass our screening, gel in and out, and wear a mask at all times, including in the patient's room. Patients must also wear a mask when staff or their support person are in the room. Visitors GUEST BADGE MUST BE WORN VISIBLY  One adult visitor may remain with you overnight and MUST be in the room by 8 P.M.     Your procedure is scheduled on: 05/22/22   Report to [facility] Main Entrance    Report to admitting at : 8:00 AM   Call this number if you have problems the morning of surgery [phone]   Do not eat food :After Midnight.   After Midnight you may have the following liquids until : 7:00 AM DAY OF SURGERY  Water Black Coffee (sugar ok, NO MILK/CREAM OR CREAMERS)  Tea (sugar ok, NO MILK/CREAM OR CREAMERS) regular and decaf                             Plain Jell-O (NO RED)                                           Fruit ices (not with fruit pulp, NO RED)                                     Popsicles (NO RED)                                                                  Juice: apple, WHITE grape, WHITE cranberry Sports drinks like Gatorade (NO RED)    Oral Hygiene is also important to reduce your risk of infection.                                    Remember - BRUSH YOUR TEETH THE MORNING OF SURGERY WITH YOUR REGULAR TOOTHPASTE  DENTURES WILL BE REMOVED PRIOR TO SURGERY PLEASE DO NOT APPLY "Poly grip" OR ADHESIVES!!!   Do NOT smoke after Midnight   Take these medicines the morning of surgery with A SIP OF WATER: aripiprazole,desvenlafaxine.  DO NOT TAKE ANY ORAL DIABETIC MEDICATIONS DAY OF YOUR  SURGERY  Bring CPAP mask and tubing day of surgery.                              You may not have any metal on your body including hair pins, jewelry, and body piercing             Do  not wear make-up, lotions, powders, perfumes/cologne, or deodorant  Do not wear nail polish including gel and S&S, artificial/acrylic nails, or any other type of covering on natural nails including finger and toenails. If you have artificial nails, gel coating, etc. that needs to be removed by a nail salon please have this removed prior to surgery or surgery may need to be canceled/ delayed if the surgeon/ anesthesia feels like they are unable to be safely monitored.   Do not shave  48 hours prior to surgery.    Do not bring valuables to the hospital. [place].   Contacts, glasses, or bridgework may not be worn into surgery.   Bring small overnight bag day of surgery.   DO NOT [place]. PHARMACY WILL DISPENSE MEDICATIONS LISTED ON YOUR MEDICATION LIST TO YOU DURING YOUR ADMISSION [place]!    Patients discharged on the day of surgery will not be allowed to drive home.  Someone NEEDS to stay with you for the first 24 hours after anesthesia.   Special Instructions: Bring a copy of your healthcare power of attorney and living will documents         the day of surgery if you haven't scanned them before.              Please read over the following fact sheets you were given: IF YOU HAVE QUESTIONS ABOUT YOUR PRE-OP INSTRUCTIONS PLEASE CALL [phone]    [facility] - Preparing for Surgery Before surgery, you can play an important role.  Because skin is not sterile, your skin needs to be as free of germs as possible.  You can reduce the number of germs on your skin by washing with CHG (chlorahexidine gluconate) soap before surgery.  CHG is an antiseptic cleaner which kills germs and bonds with the skin to continue killing  germs even after washing. Please DO NOT use if you have an allergy to CHG or antibacterial soaps.  If your skin becomes reddened/irritated stop using the CHG and inform your nurse when you arrive at Short Stay. Do not shave (including legs and underarms) for at least 48 hours prior to the first CHG shower.  You may shave your face/neck. Please follow these instructions carefully:  1.  Shower with CHG Soap the night before surgery and the  morning of Surgery.  2.  If you choose to wash your hair, wash your hair first as usual with your  normal  shampoo.  3.  After you shampoo, rinse your hair and body thoroughly to remove the  shampoo.                           4.  Use CHG as you would any other liquid soap.  You can apply chg directly  to the skin and wash                       Gently with a scrungie or clean washcloth.  5.  Apply the CHG Soap to your body ONLY FROM THE NECK DOWN.   Do not use on face/ open                           Wound or open sores. Avoid contact with eyes, ears  mouth and genitals (private parts).                       Wash face,  Genitals (private parts) with your normal soap.             6.  Wash thoroughly, paying special attention to the area where your surgery  will be performed.  7.  Thoroughly rinse your body with warm water from the neck down.  8.  DO NOT shower/wash with your normal soap after using and rinsing off  the CHG Soap.                9.  Pat yourself dry with a clean towel.            10.  Wear clean pajamas.            11.  Place clean sheets on your bed the night of your first shower and do not  sleep with pets. Day of Surgery : Do not apply any lotions/deodorants the morning of surgery.  Please wear clean clothes to the hospital/surgery center.  FAILURE TO FOLLOW THESE INSTRUCTIONS MAY RESULT IN THE CANCELLATION OF YOUR SURGERY PATIENT SIGNATURE_________________________________  NURSE  SIGNATURE__________________________________  ________________________________________________________________________

## 2022-05-17 ENCOUNTER — Other Ambulatory Visit: Payer: Self-pay

## 2022-05-17 ENCOUNTER — Encounter (HOSPITAL_COMMUNITY): Payer: Self-pay

## 2022-05-17 ENCOUNTER — Encounter (HOSPITAL_COMMUNITY)
Admission: RE | Admit: 2022-05-17 | Discharge: 2022-05-17 | Disposition: A | Discharge status: Home / Self Care | Payer: Medicare Other | Source: Ambulatory Visit | Attending: Urology | Admitting: Urology

## 2022-05-17 VITALS — BP 127/77 | HR 78 | Temp 97.9°F | Ht 67.0 in | Wt 198.0 lb

## 2022-05-17 DIAGNOSIS — N200 Calculus of kidney: Secondary | ICD-10-CM | POA: Insufficient documentation

## 2022-05-17 DIAGNOSIS — G4730 Sleep apnea, unspecified: Secondary | ICD-10-CM | POA: Diagnosis not present

## 2022-05-17 DIAGNOSIS — N289 Disorder of kidney and ureter, unspecified: Secondary | ICD-10-CM | POA: Diagnosis not present

## 2022-05-17 DIAGNOSIS — R7303 Prediabetes: Secondary | ICD-10-CM

## 2022-05-17 DIAGNOSIS — Z87891 Personal history of nicotine dependence: Secondary | ICD-10-CM | POA: Insufficient documentation

## 2022-05-17 DIAGNOSIS — I2510 Atherosclerotic heart disease of native coronary artery without angina pectoris: Secondary | ICD-10-CM | POA: Diagnosis not present

## 2022-05-17 DIAGNOSIS — Z01812 Encounter for preprocedural laboratory examination: Secondary | ICD-10-CM | POA: Diagnosis present

## 2022-05-17 HISTORY — DX: Personal history of urinary calculi: Z87.442

## 2022-05-17 HISTORY — DX: Angina pectoris, unspecified: I20.9

## 2022-05-17 LAB — BASIC METABOLIC PANEL
Anion gap: 5 (ref 5–15)
BUN: 13 mg/dL (ref 8–23)
CO2: 25 mmol/L (ref 22–32)
Calcium: 9.4 mg/dL (ref 8.9–10.3)
Chloride: 108 mmol/L (ref 98–111)
Creatinine, Ser: 0.94 mg/dL (ref 0.44–1.00)
GFR, Estimated: >60 mL/min (ref >60)
Glucose, Bld: 101 mg/dL — ABNORMAL HIGH (ref 70–99)
Potassium: 3.8 mmol/L (ref 3.5–5.1)
Sodium: 138 mmol/L (ref 135–145)

## 2022-05-17 LAB — CBC
HCT: 42.5 % (ref 36.0–46.0)
Hemoglobin: 13.3 g/dL (ref 12.0–15.0)
MCH: 27.1 pg (ref 26.0–34.0)
MCHC: 31.3 g/dL (ref 30.0–36.0)
MCV: 86.7 fL (ref 80.0–100.0)
Platelets: 161 10*3/uL (ref 150–400)
RBC: 4.9 MIL/uL (ref 3.87–5.11)
RDW: 17 % — ABNORMAL HIGH (ref 11.5–15.5)
WBC: 3.3 10*3/uL — ABNORMAL LOW (ref 4.0–10.5)
nRBC: 0 % (ref 0.0–0.2)

## 2022-05-17 NOTE — Progress Notes (Addendum)
For Short Stay: [place] appointment date:  Bowel Prep reminder:   For Anesthesia: PCP - Dr. Hale Bogus  Cardiologist - Dr. Tillie Rung.: LOV: 03/06/22  Chest x-ray - 11/30/21 EKG - 02/21/22 Stress Test -  ECHO - 05/04/22 Cardiac Cath -  Pacemaker/ICD device last checked: Pacemaker orders received: Device Rep notified:  Spinal Cord Stimulator:  Sleep Study - Yes  CPAP - NO  Fasting Blood Sugar -  Checks Blood Sugar _____ times a day Date and result of last Hgb A1c-  Last dose of GLP1 agonist-  GLP1 instructions:   Last dose of SGLT-2 inhibitors-  SGLT-2 instructions:   Blood Thinner Instructions: Aspirin Instructions: Last Dose:  Activity level: Can go up a flight of stairs and activities of daily living without stopping and without chest pain and/or shortness of breath   Able to exercise without chest pain and/or shortness of breath    Anesthesia review: Hx: OSA(NO CPAP),Chest pain  Patient denies shortness of breath, fever, cough and chest pain at PAT appointment   Patient verbalized understanding of instructions that were given to them at the PAT appointment. Patient was also instructed that they will need to review over the PAT instructions again at home before surgery.

## 2022-05-18 NOTE — Progress Notes (Signed)
Anesthesia Chart Review   Case: [id number] Date/Time: 05/22/22 1000   Procedures:      CYSTOSCOPY WITH RETROGRADE PYELOGRAM, URETEROSCOPY AND STENT PLACEMENT (Left)     MOSES LASER APPLICATION (Left)   Anesthesia type: General   Pre-op diagnosis: RENAL CALCULUS   Location: [facility] / [facility]   Surgeons: Robley Fries, MD       DISCUSSION:67 y.o. former smoker with h/o sleep apnea, renal calculus scheduled for above procedure 05/22/22 with Dr. Jacalyn Lefevre.   Pt last seen by cardiology 03/06/2022. CT coronary with nonobstructive CAD.  Echo 05/04/2022 with normal LV systolic function, normal RV size and function.  VS: BP 127/77   Pulse 78   Temp 36.6 C (Oral)   Ht '5\' 7"'$  (1.702 m)   Wt 89.8 kg   LMP 01/28/2010   SpO2 96%   BMI 31.01 kg/m   PROVIDERS: Megan Salon, MD is PCP   Mertie Moores, MD is Cardiologist  LABS: Labs reviewed: Acceptable for surgery. (all labs ordered are listed, but only abnormal results are displayed)  Labs Reviewed  BASIC METABOLIC PANEL - Abnormal; Notable for the following components:      Result Value   Glucose, Bld 101 (*)    All other components within normal limits  CBC - Abnormal; Notable for the following components:   WBC 3.3 (*)    RDW 17.0 (*)    All other components within normal limits     IMAGES:   EKG:   CV: Echo 05/04/22 1. Left ventricular ejection fraction, by estimation, is 60 to 65%. The  left ventricle has normal function. The left ventricle has no regional  wall motion abnormalities. Left ventricular diastolic parameters are  indeterminate. The average left  ventricular global longitudinal strain is -21.8 %. The global longitudinal  strain is normal.   2. Right ventricular systolic function is normal. The right ventricular  size is normal. Tricuspid regurgitation signal is inadequate for assessing  PA pressure.   3. The mitral valve is normal in structure. No evidence of mitral valve  regurgitation.  No evidence of mitral stenosis.   4. The aortic valve is normal in structure. Aortic valve regurgitation is  not visualized. No aortic stenosis is present.   5. The inferior vena cava is normal in size with greater than 50%  respiratory variability, suggesting right atrial pressure of 3 mmHg.  Past Medical History:  Diagnosis Date   Anemia    Anginal pain (HCC)    Anxiety    Arthritis    Chest pain    Depression    Diverticulosis    GERD (gastroesophageal reflux disease)    tums only PRN   History of diverticulitis    History of kidney stones    Ovarian cyst    Prediabetes    Recurrent UTI    Sleep apnea    no CPAP    Past Surgical History:  Procedure Laterality Date   BLADDER SUSPENSION N/A 04/24/2013   Procedure: TRANSVAGINAL TAPE (TVT) Exact PROCEDURE;  Surgeon: Elveria Royals, MD;  Location: [facility];  Service: Gynecology;  Laterality: N/A;   CARPAL TUNNEL RELEASE Bilateral 1997   CATARACT EXTRACTION, BILATERAL     COLONOSCOPY     CYSTOSCOPY N/A 04/24/2013   Procedure: CYSTOSCOPY;  Surgeon: Elveria Royals, MD;  Location: [facility];  Service: Gynecology;  Laterality: N/A;   GIVENS CAPSULE STUDY N/A 10/21/2017   Procedure: GIVENS CAPSULE STUDY;  Surgeon: Wilfrid Lund  L III, MD;  Location: [facility];  Service: Gastroenterology;  Laterality: N/A;   LAPAROSCOPY Bilateral 05/03/2014   Procedure: LAPAROSCOPIC BILATERAL SALPINGO OOPHORECTOMY;  Surgeon: Lyman Speller, MD;  Location: [facility];  Service: Gynecology;  Laterality: Bilateral;   MOUTH SURGERY     TOTAL KNEE ARTHROPLASTY Bilateral 04/2010    MEDICATIONS:  acetaminophen (TYLENOL) 500 MG tablet   ALPRAZolam (XANAX) 0.5 MG tablet   amoxicillin (AMOXIL) 500 MG capsule   ARIPiprazole (ABILIFY) 10 MG tablet   AUVELITY 45-105 MG TBCR   CRANBERRY-VITAMIN C PO   estradiol (ESTRACE) 0.1 MG/GM vaginal cream   ibuprofen (ADVIL) 200 MG tablet   Methenamine-Sodium Salicylate (AZO URINARY TRACT DEFENSE PO)    Phenazopyrid-Cranbry-C-Probiot (AZO URINARY TRACT SUPPORT PO)   rosuvastatin (CRESTOR) 20 MG tablet   No current facility-administered medications for this encounter.   Konrad Felix Ward, PA-C [facility] [phone]

## 2022-05-20 NOTE — H&P (Signed)
CC/HPI: cc: microscopic hematuria   09/20/21: 67 yo woman referred for persistent microscopic hematuria. She was recently treated for e.coli UTI. She gets 1-2 UTIs per year. The most recent 1 was very painful to her but she is also been treated for UTI in the absence of symptoms. She does have 10-20 RBCs per high-powered field on UA today. She currently feels depressed as her sister has Alzheimer's and she is trying to coordinate her moved to [place] to be near her sister's daughter.   09/27/2021: Here for cystoscopy and CT results. CT of the abdomen pelvis shows 2 calculi in the left renal pelvis measuring up to 6 mm with inflammatory stranding as well as small lesion on the right renal sinus. Patient has persistent microscopic hematuria on urinalysis today.   11/06/21: 67 year old woman initially seen for microscopic hematuria found to have left renal calculi on upper tract imaging. There is also concern for enhancing lesion around the right renal sinus. She subsequently underwent an MRI of the kidneys which showed a cyst but no abnormal enhancement. Patient is currently undergoing [place] treatment for depression.   05/02/22: 67 year old woman initially seen for microscopic hematuria found to have left urolithiasis on imaging. She is here today for repeat KUB to assess for interval change. Left renal pelvis calculus has increased in size now approximately 15 mm. She feels like she has a UTI today. This is her fourth UTI in the last several months. She did not notice any improvement in her depression with [place].     ALLERGIES: No Allergies    MEDICATIONS: Crestor 20 mg tablet  Abilify 10 mg tablet  Alprazolam 0.5 mg tablet  Alprazolam Er 0.5 mg tablet, extended release 24 hr  Auvelity 45 mg-105 mg tablet,immed, extended release, biphasic  Desvenlafaxine Er 50 mg tablet, extended release 24 hr tablet BID  Rosuvastatin Calcium 20 mg tablet     GU PSH: Cystoscopy - 09/27/2021 Locm 300-'399Mg'$ /Ml Iodine,1Ml  - 09/22/2021       PSH Notes: Knee Replacement, Wrist Incision, fallopian tubes removed   NON-GU PSH: Revise Knee Joint - 2013     GU PMH: Microscopic hematuria - 11/06/2021, - 09/27/2021, - 09/22/2021, - 09/20/2021 Renal calculus - 11/06/2021, - 09/27/2021 Renal cyst - 11/06/2021 Ureter, Right, 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 Chronic cystitis (with hematuria) - 09/20/2021 Mixed incontinence, Urge and stress incontinence - 2014 Urinary Frequency, Increased urinary frequency - 2014 Urinary incontinence, Unspec, Urinary incontinence - 2014 Weak Urinary Stream, Weak urinary stream - 2014      PMH Notes:  1898-04-30 00:00:00 - Note: Normal Routine History And Physical Adult  2011-11-21 08:35:16 - Note: Arthritis   NON-GU PMH: Anxiety, Anxiety (Symptom) - 2014 Personal history of other diseases of the digestive system, History of esophageal reflux - 2014 Personal history of other diseases of the nervous system and sense organs, History of sleep apnea - 2014 Personal history of other mental and behavioral disorders, History of depression - 2014 Depression GERD    FAMILY HISTORY: 1 Daughter - Daughter 1 son - Son copd - Father Death In The Family Father - Runs In Family Death In The Family Mother - Runs In Family Family Health Status Number - Runs In Family multiple myeloma - Mother   SOCIAL HISTORY: Marital Status: Married Preferred Language: English; Ethnicity: Not Hispanic Or Latino; Race: White Current Smoking Status: Patient does not smoke anymore.   Tobacco Use Assessment Completed: Used Tobacco in last 30 days? Has never drank.  Drinks  2 caffeinated drinks per day.     Notes: Former smoker, Marital History - Currently Married, Caffeine Use, Being A Economist, Occupation:   REVIEW OF SYSTEMS:    GU Review Female:   Patient denies frequent urination, hard to postpone urination, burning /pain with urination, get up at night to urinate, leakage of urine, stream  starts and stops, trouble starting your stream, have to strain to urinate, and being pregnant.  Gastrointestinal (Upper):   Patient denies nausea, vomiting, and indigestion/ heartburn.  Gastrointestinal (Lower):   Patient denies diarrhea and constipation.  Constitutional:   Patient denies fever, night sweats, weight loss, and fatigue.  Skin:   Patient denies skin rash/ lesion and itching.  Eyes:   Patient denies blurred vision and double vision.  Ears/ Nose/ Throat:   Patient denies sore throat and sinus problems.  Hematologic/Lymphatic:   Patient denies swollen glands and easy bruising.  Cardiovascular:   Patient denies leg swelling and chest pains.  Respiratory:   Patient denies cough and shortness of breath.  Endocrine:   Patient denies excessive thirst.  Musculoskeletal:   Patient denies back pain and joint pain.  Neurological:   Patient denies headaches and dizziness.  Psychologic:   Patient denies depression and anxiety.   VITAL SIGNS:      05/02/2022 09:29 AM  BP 100/66 mmHg  Pulse 87 /min  Temperature 97.3 F / 36.2 C   MULTI-SYSTEM PHYSICAL EXAMINATION:    Constitutional: Well-nourished. No physical deformities. Normally developed. Good grooming.  Neck: Neck symmetrical, not swollen. Normal tracheal position.  Respiratory: No labored breathing, no use of accessory muscles.   Lymphatic: No enlargement of neck, axillae, groin.  Skin: No paleness, no jaundice, no cyanosis. No lesion, no ulcer, no rash.  Neurologic / Psychiatric: Oriented to time, oriented to place, oriented to person. No depression, no anxiety, no agitation.  Eyes: Normal conjunctivae. Normal eyelids.  Ears, Nose, Mouth, and Throat: Left ear no scars, no lesions, no masses. Right ear no scars, no lesions, no masses. Nose no scars, no lesions, no masses. Normal hearing. Normal lips.  Musculoskeletal: Normal gait and station of head and neck.     Complexity of Data:  Records Review:   Previous Patient Records,  POC Tool  Urine Test Review:   Urinalysis  X-Ray Review: KUB: Reviewed Films. Discussed With Patient. 15 mm left renal pelvis stone    PROCEDURES:         KUB - 16967  A single view of the abdomen is obtained.      Patient confirmed No Neulasta OnPro Device.           Urinalysis w/Scope Dipstick Dipstick Cont'd Micro  Color: Orange Bilirubin: Neg mg/dL WBC/hpf: 40 - 60/hpf  Appearance: Cloudy Ketones: Neg mg/dL RBC/hpf: 3 - 10/hpf  Specific Gravity: 1.025 Blood: 3+ ery/uL Bacteria: Many (>50/hpf)  pH: 6.0 Protein: 1+ mg/dL Cystals: Ca Oxalate  Glucose: Neg mg/dL Urobilinogen: 0.2 mg/dL Casts: Hyaline    Nitrites: Positive Trichomonas: Not Present    Leukocyte Esterase: 3+ leu/uL Mucous: Present      Epithelial Cells: 6 - 10/hpf      Yeast: NS (Not Seen)      Sperm: Not Present    Notes: Mixed cellular components present    ASSESSMENT:      ICD-10 Details  1 GU:   Chronic cystitis (with hematuria) - N30.21 Chronic, Stable  2   Renal calculus - N20.0 Chronic, Stable  3   Acute Cystitis/UTI -  Y92.44 Acute, Uncomplicated   PLAN:            Medications New Meds: Bactrim Ds 800 mg-160 mg tablet 1 tablet PO BID   #14  0 Refill(s)  Estradiol 0.01 % cream with applicator 1 gram Transdermal Q HS Place 1 g of cream in the vagina nightly for 14 days then twice a week thereafter  #60  3 Refill(s)  Pharmacy Name:  [facility]  Address:  [place], [place] [id number]  Phone:  [phone]  Fax:  [phone]            Orders Labs CULTURE, URINE          Document Letter(s):  Created for Patient: Clinical Summary         Notes:   1. Acute cystitis: Send urine for culture today and treat empirically with Bactrim.   2. Chronic cystitis: Patient's renal calculus likely contributing as a nidus for recurrent infections however I did give her informational handout on recurrent UTIs. I recommended vaginal estradiol cream and she was counseled  regarding how to use this. In addition I recommended a bladder probiotic with d-mannose and cranberry.   3. Renal calculus: Reviewed KUB which has shown that the stone is doubled in size. She is getting frequent UTIs as well. We discussed treatment of the stone including ESWL versus ureteroscopy. After discussion of risks and benefits we decided to proceed with ureteroscopy. Risks include but are not limited to pain, bleeding, infection, stent discomfort, damage surrounding structures, need for additional procedure, need for staged procedure, ureteral stricture. Patient understand these risks and wishes to proceed. She will be scheduled for next available date.

## 2022-05-21 NOTE — Discharge Instructions (Addendum)
DISCHARGE INSTRUCTIONS FOR KIDNEY STONE/URETERAL STENT   MEDICATIONS:  1. Resume all your other meds from home  2. AZO over the counter can help with the burning/stinging when you urinate. 3. Tramadol is for moderate/severe pain, otherwise taking up to 1000 mg every 6 hours of plain Tylenol and ibuprofen will help treat your pain.   4. Tamsulosin can help with stent discomfort 5. Oxybuytnin can be used to the feeling of overactive bladder 6. Cephalexin '250mg'$  daily is an antibiotic to help prevent infection    ACTIVITY:  1. No strenuous activity x 1week  2. No driving while on narcotic pain medications  3. Drink plenty of water  4. Continue to walk at home - you can still get blood clots when you are at home, so keep active, but don't over do it.  5. May return to work/school tomorrow or when you feel ready   BATHING:  1. You can shower anytime  SIGNS/SYMPTOMS TO CALL:  Please call us if you have a fever greater than 101.5, uncontrolled nausea/vomiting, uncontrolled pain, dizziness, unable to urinate, bloody urine, chest pain, shortness of breath, leg swelling, leg pain, redness around wound, drainage from wound, or any other concerns or questions.   You can reach [name] at [phone].   FOLLOW-UP:  1. You will be called with your next surgery date

## 2022-05-21 NOTE — Anesthesia Preprocedure Evaluation (Addendum)
Anesthesia Evaluation  Patient identified by MRN, date of birth, ID band Patient awake    Reviewed: Allergy & Precautions, NPO status , Patient's Chart, lab work & pertinent test results  Airway Mallampati: III  TM Distance: >3 FB Neck ROM: Full    Dental no notable dental hx. (+) Dental Advisory Given   Pulmonary sleep apnea , former smoker   Pulmonary exam normal        Cardiovascular negative cardio ROS Normal cardiovascular exam  TTE 05/04/2022 IMPRESSIONS     1. Left ventricular ejection fraction, by estimation, is 60 to 65%. The  left ventricle has normal function. The left ventricle has no regional  wall motion abnormalities. Left ventricular diastolic parameters are  indeterminate. The average left  ventricular global longitudinal strain is -21.8 %. The global longitudinal  strain is normal.   2. Right ventricular systolic function is normal. The right ventricular  size is normal. Tricuspid regurgitation signal is inadequate for assessing  PA pressure.   3. The mitral valve is normal in structure. No evidence of mitral valve  regurgitation. No evidence of mitral stenosis.   4. The aortic valve is normal in structure. Aortic valve regurgitation is  not visualized. No aortic stenosis is present.   5. The inferior vena cava is normal in size with greater than 50%  respiratory variability, suggesting right atrial pressure of 3 mmHg.      Neuro/Psych  PSYCHIATRIC DISORDERS Anxiety Depression    negative neurological ROS     GI/Hepatic negative GI ROS, Neg liver ROS,,,  Endo/Other  negative endocrine ROS    Renal/GU      Musculoskeletal negative musculoskeletal ROS (+)    Abdominal   Peds  Hematology negative hematology ROS (+)   Anesthesia Other Findings   Reproductive/Obstetrics                             Anesthesia Physical Anesthesia Plan  ASA: 2  Anesthesia Plan:  General   Post-op Pain Management: Tylenol PO (pre-op)* and Toradol IV (intra-op)*   Induction: Intravenous  PONV Risk Score and Plan: 3 and Ondansetron, Midazolam and Dexamethasone  Airway Management Planned: LMA  Additional Equipment:   Intra-op Plan:   Post-operative Plan: Extubation in OR  Informed Consent: I have reviewed the patients History and Physical, chart, labs and discussed the procedure including the risks, benefits and alternatives for the proposed anesthesia with the patient or authorized representative who has indicated his/her understanding and acceptance.     Dental advisory given  Plan Discussed with: Anesthesiologist and CRNA  Anesthesia Plan Comments:        Anesthesia Quick Evaluation

## 2022-05-22 ENCOUNTER — Other Ambulatory Visit: Payer: Self-pay

## 2022-05-22 ENCOUNTER — Encounter (HOSPITAL_COMMUNITY)
Admission: RE | Disposition: A | Discharge status: Home / Self Care | Payer: Self-pay | Source: Home / Self Care | Attending: Urology

## 2022-05-22 ENCOUNTER — Ambulatory Visit (HOSPITAL_BASED_OUTPATIENT_CLINIC_OR_DEPARTMENT_OTHER): Payer: Medicare Other | Admitting: Anesthesiology

## 2022-05-22 ENCOUNTER — Encounter (HOSPITAL_COMMUNITY): Payer: Self-pay | Admitting: Urology

## 2022-05-22 ENCOUNTER — Ambulatory Visit (HOSPITAL_COMMUNITY): Payer: Medicare Other

## 2022-05-22 ENCOUNTER — Ambulatory Visit (HOSPITAL_COMMUNITY): Payer: Medicare Other | Admitting: Physician Assistant

## 2022-05-22 ENCOUNTER — Ambulatory Visit (HOSPITAL_COMMUNITY)
Admission: RE | Admit: 2022-05-22 | Discharge: 2022-05-22 | Disposition: A | Discharge status: Home / Self Care | Payer: Medicare Other | Attending: Urology | Admitting: Urology

## 2022-05-22 DIAGNOSIS — F419 Anxiety disorder, unspecified: Secondary | ICD-10-CM | POA: Diagnosis not present

## 2022-05-22 DIAGNOSIS — N3001 Acute cystitis with hematuria: Secondary | ICD-10-CM | POA: Insufficient documentation

## 2022-05-22 DIAGNOSIS — Z87891 Personal history of nicotine dependence: Secondary | ICD-10-CM | POA: Insufficient documentation

## 2022-05-22 DIAGNOSIS — Z87440 Personal history of urinary (tract) infections: Secondary | ICD-10-CM | POA: Diagnosis not present

## 2022-05-22 DIAGNOSIS — N200 Calculus of kidney: Secondary | ICD-10-CM | POA: Insufficient documentation

## 2022-05-22 DIAGNOSIS — N3021 Other chronic cystitis with hematuria: Secondary | ICD-10-CM | POA: Insufficient documentation

## 2022-05-22 DIAGNOSIS — F32A Depression, unspecified: Secondary | ICD-10-CM | POA: Insufficient documentation

## 2022-05-22 DIAGNOSIS — G4730 Sleep apnea, unspecified: Secondary | ICD-10-CM | POA: Insufficient documentation

## 2022-05-22 HISTORY — PX: CYSTOSCOPY WITH RETROGRADE PYELOGRAM, URETEROSCOPY AND STENT PLACEMENT: SHX5789

## 2022-05-22 HISTORY — PX: HOLMIUM LASER APPLICATION: SHX5852

## 2022-05-22 SURGERY — CYSTOURETEROSCOPY, WITH RETROGRADE PYELOGRAM AND STENT INSERTION
Anesthesia: General | Laterality: Left

## 2022-05-22 MED ORDER — PROMETHAZINE HCL 25 MG/ML IJ SOLN: 6.2500 mg | INTRAMUSCULAR | Status: DC | PRN

## 2022-05-22 MED ORDER — LIDOCAINE HCL (PF) 2 % IJ SOLN
INTRAMUSCULAR | Status: AC
  Filled 2022-05-22: qty 5

## 2022-05-22 MED ORDER — CEFAZOLIN SODIUM-DEXTROSE 2-4 GM/100ML-% IV SOLN
2.0000 g | INTRAVENOUS | Status: AC
  Administered 2022-05-22: 2 g via INTRAVENOUS
  Filled 2022-05-22: qty 100

## 2022-05-22 MED ORDER — PHENYLEPHRINE 80 MCG/ML (10ML) SYRINGE FOR IV PUSH (FOR BLOOD PRESSURE SUPPORT)
PREFILLED_SYRINGE | INTRAVENOUS | Status: AC
  Filled 2022-05-22: qty 20

## 2022-05-22 MED ORDER — PROPOFOL 10 MG/ML IV BOLUS
INTRAVENOUS | Status: DC | PRN
  Administered 2022-05-22: 120 mg via INTRAVENOUS

## 2022-05-22 MED ORDER — MIDAZOLAM HCL 2 MG/2ML IJ SOLN
INTRAMUSCULAR | Status: AC
  Filled 2022-05-22: qty 2

## 2022-05-22 MED ORDER — IOHEXOL 300 MG/ML  SOLN
INTRAMUSCULAR | Status: DC | PRN
  Administered 2022-05-22: 6 mL

## 2022-05-22 MED ORDER — TAMSULOSIN HCL 0.4 MG PO CAPS: 0.4000 mg | ORAL_CAPSULE | Freq: Every day | ORAL | 0 refills | Status: DC

## 2022-05-22 MED ORDER — TRAMADOL HCL 50 MG PO TABS: 50.0000 mg | ORAL_TABLET | Freq: Four times a day (QID) | ORAL | 0 refills | Status: DC | PRN

## 2022-05-22 MED ORDER — ACETAMINOPHEN 500 MG PO TABS
1000.0000 mg | ORAL_TABLET | Freq: Once | ORAL | Status: AC
  Administered 2022-05-22: 1000 mg via ORAL
  Filled 2022-05-22: qty 2

## 2022-05-22 MED ORDER — ORAL CARE MOUTH RINSE: 15.0000 mL | Freq: Once | OROMUCOSAL | Status: AC

## 2022-05-22 MED ORDER — DEXAMETHASONE SODIUM PHOSPHATE 10 MG/ML IJ SOLN
INTRAMUSCULAR | Status: AC
  Filled 2022-05-22: qty 1

## 2022-05-22 MED ORDER — FENTANYL CITRATE (PF) 100 MCG/2ML IJ SOLN
INTRAMUSCULAR | Status: AC
  Filled 2022-05-22: qty 2

## 2022-05-22 MED ORDER — LACTATED RINGERS IV SOLN: INTRAVENOUS | Status: DC

## 2022-05-22 MED ORDER — PROPOFOL 10 MG/ML IV BOLUS
INTRAVENOUS | Status: AC
  Filled 2022-05-22: qty 20

## 2022-05-22 MED ORDER — AMISULPRIDE (ANTIEMETIC) 5 MG/2ML IV SOLN: 10.0000 mg | Freq: Once | INTRAVENOUS | Status: DC | PRN

## 2022-05-22 MED ORDER — SODIUM CHLORIDE 0.9 % IR SOLN
Status: DC | PRN
  Administered 2022-05-22: 3000 mL

## 2022-05-22 MED ORDER — LIDOCAINE 2% (20 MG/ML) 5 ML SYRINGE
INTRAMUSCULAR | Status: DC | PRN
  Administered 2022-05-22: 100 mg via INTRAVENOUS

## 2022-05-22 MED ORDER — DEXAMETHASONE SODIUM PHOSPHATE 10 MG/ML IJ SOLN
INTRAMUSCULAR | Status: DC | PRN
  Administered 2022-05-22: 4 mg via INTRAVENOUS

## 2022-05-22 MED ORDER — CEPHALEXIN 250 MG PO CAPS: 250.0000 mg | ORAL_CAPSULE | Freq: Every day | ORAL | 0 refills | Status: AC

## 2022-05-22 MED ORDER — CHLORHEXIDINE GLUCONATE 0.12 % MT SOLN
15.0000 mL | Freq: Once | OROMUCOSAL | Status: AC
  Administered 2022-05-22: 15 mL via OROMUCOSAL

## 2022-05-22 MED ORDER — MIDAZOLAM HCL 2 MG/2ML IJ SOLN
INTRAMUSCULAR | Status: DC | PRN
  Administered 2022-05-22: 1 mg via INTRAVENOUS

## 2022-05-22 MED ORDER — ONDANSETRON HCL 4 MG/2ML IJ SOLN
INTRAMUSCULAR | Status: DC | PRN
  Administered 2022-05-22: 4 mg via INTRAVENOUS

## 2022-05-22 MED ORDER — OXYBUTYNIN CHLORIDE ER 10 MG PO TB24: 10.0000 mg | ORAL_TABLET | Freq: Every day | ORAL | 0 refills | Status: DC

## 2022-05-22 MED ORDER — ONDANSETRON HCL 4 MG/2ML IJ SOLN
INTRAMUSCULAR | Status: AC
  Filled 2022-05-22: qty 2

## 2022-05-22 MED ORDER — FENTANYL CITRATE PF 50 MCG/ML IJ SOSY: 25.0000 ug | PREFILLED_SYRINGE | INTRAMUSCULAR | Status: DC | PRN

## 2022-05-22 MED ORDER — FENTANYL CITRATE (PF) 100 MCG/2ML IJ SOLN
INTRAMUSCULAR | Status: DC | PRN
  Administered 2022-05-22: 50 ug via INTRAVENOUS

## 2022-05-22 SURGICAL SUPPLY — 22 items
BAG URO CATCHER STRL LF (MISCELLANEOUS) ×1 IMPLANT
BASKET ZERO TIP NITINOL 2.4FR (BASKET) IMPLANT
BSKT STON RTRVL ZERO TP 2.4FR (BASKET) ×1
CATH URETL OPEN 5X70 (CATHETERS) ×1 IMPLANT
CLOTH BEACON ORANGE TIMEOUT ST (SAFETY) ×1 IMPLANT
DRSG TEGADERM 2-3/8X2-3/4 SM (GAUZE/BANDAGES/DRESSINGS) IMPLANT
EXTRACTOR STONE 1.7FRX115CM (UROLOGICAL SUPPLIES) IMPLANT
GLOVE BIO SURGEON STRL SZ 6.5 (GLOVE) ×1 IMPLANT
GOWN STRL REUS W/ TWL LRG LVL3 (GOWN DISPOSABLE) ×1 IMPLANT
GOWN STRL REUS W/TWL LRG LVL3 (GOWN DISPOSABLE) ×1
GUIDEWIRE STR DUAL SENSOR (WIRE) ×1 IMPLANT
KIT TURNOVER KIT A (KITS) IMPLANT
LASER FIB FLEXIVA PULSE ID 365 (Laser) IMPLANT
MANIFOLD NEPTUNE II (INSTRUMENTS) ×1 IMPLANT
PACK CYSTO (CUSTOM PROCEDURE TRAY) ×1 IMPLANT
SHEATH NAVIGATOR HD 11/13X28 (SHEATH) IMPLANT
SHEATH NAVIGATOR HD 11/13X36 (SHEATH) IMPLANT
STENT URET 6FRX26 CONTOUR (STENTS) IMPLANT
TRACTIP FLEXIVA PULS ID 200XHI (Laser) IMPLANT
TRACTIP FLEXIVA PULSE ID 200 (Laser) ×1
TUBING CONNECTING 10 (TUBING) ×1 IMPLANT
TUBING UROLOGY SET (TUBING) ×1 IMPLANT

## 2022-05-22 NOTE — OR Nursing (Signed)
Stone taken with patient

## 2022-05-22 NOTE — Interval H&P Note (Signed)
History and Physical Interval Note:  05/22/2022 9:50 AM  Jasmine [name] Sharia Reeve  has presented today for surgery, with the diagnosis of RENAL CALCULUS.  The various methods of treatment have been discussed with the patient and family. After consideration of risks, benefits and other options for treatment, the patient has consented to  Procedure(s): CYSTOSCOPY WITH RETROGRADE PYELOGRAM, URETEROSCOPY AND STENT PLACEMENT (Left) MOSES LASER APPLICATION (Left) as a surgical intervention.  The patient's history has been reviewed, patient examined, no change in status, stable for surgery.  I have reviewed the patient's chart and labs.  Questions were answered to the patient's satisfaction.     Laden Fieldhouse D Kimeka Badour

## 2022-05-22 NOTE — Anesthesia Procedure Notes (Signed)
Procedure Name: LMA Insertion Date/Time: 05/22/2022 10:05 AM  Performed by: Lind Covert, CRNAPre-anesthesia Checklist: Patient identified, Emergency Drugs available, Suction available, Patient being monitored and Timeout performed Patient Re-evaluated:Patient Re-evaluated prior to induction Oxygen Delivery Method: Circle system utilized Preoxygenation: Pre-oxygenation with 100% oxygen Induction Type: IV induction LMA: LMA inserted LMA Size: 4.0 Tube type: Oral Number of attempts: 1 Placement Confirmation: positive ETCO2 and breath sounds checked- equal and bilateral Tube secured with: Tape Dental Injury: Teeth and Oropharynx as per pre-operative assessment

## 2022-05-22 NOTE — Anesthesia Postprocedure Evaluation (Signed)
Anesthesia Post Note  Patient: Jasmine [name]  Procedure(s) Performed: CYSTOSCOPY WITH RETROGRADE PYELOGRAM, URETEROSCOPY AND STENT PLACEMENT (Left) MOSES LASER APPLICATION (Left)     Patient location during evaluation: PACU Anesthesia Type: General Level of consciousness: sedated Pain management: pain level controlled Vital Signs Assessment: post-procedure vital signs reviewed and stable Respiratory status: spontaneous breathing and respiratory function stable Cardiovascular status: stable Postop Assessment: no apparent nausea or vomiting Anesthetic complications: no  No notable events documented.  Last Vitals:  Vitals:   05/22/22 1125 05/22/22 1130  BP:  (!) 152/82  Pulse: 67 70  Resp: 13 12  Temp: 36.7 C   SpO2: 94% 95%    Last Pain:  Vitals:   05/22/22 1145  TempSrc:   PainSc: 0-No pain                 Quynh Basso DANIEL

## 2022-05-22 NOTE — Op Note (Signed)
Preoperative diagnosis: left renal calculus  Postoperative diagnosis: left renal calculus  Procedure:  Cystoscopy left ureteroscopy, laser lithotripsy, basket stone extraction left 70F x 26cm ureteral stent placement - no tether left retrograde pyelography with interpretation  Surgeon: Jacalyn Lefevre, MD  Anesthesia: General  Complications: None  Intraoperative findings:  Normal urethra Bilateral orthotropic ureteral orifices left retrograde pyelography demonstrated a filling defect within the left renal pelvis consistent with the patient's known calculus without other abnormalities. Bladder mucosa normal without masses   EBL: Minimal  Specimens: left renal calculus  Disposition of specimens: [facility] for stone analysis  Indication: Jasmine [name] is a 67 y.o.   patient with a 48m left renal stone and associated left symptoms. After reviewing the management options for treatment, the patient elected to proceed with the above surgical procedure(s). We have discussed the potential benefits and risks of the procedure, side effects of the proposed treatment, the likelihood of the patient achieving the goals of the procedure, and any potential problems that might occur during the procedure or recuperation. Informed consent has been obtained.   Description of procedure:  The patient was taken to the operating room and general anesthesia was induced.  The patient was placed in the dorsal lithotomy position, prepped and draped in the usual sterile fashion, and preoperative antibiotics were administered. A preoperative time-out was performed.   Cystourethroscopy was performed.  The patient's urethra was examined and was normal. The bladder was then systematically examined in its entirety. There was no evidence for any bladder tumors, stones, or other mucosal pathology.    Attention then turned to the left ureteral orifice and a ureteral catheter was used to  intubate the ureteral orifice.  Omnipaque contrast was injected through the ureteral catheter and a retrograde pyelogram was performed with findings as dictated above.  A 0.38 sensor guidewire was then advanced through the open-ended ureteral catheter and into the renal pelvis with fluoroscopic guidance.  The open-ended ureteral catheter was then removed.  Next a second 0.38 sensor wire was advanced alongside the first sensor wire and advanced to the kidney with fluoroscopic guidance.  One of the wires was secured as a safety wire.  The ureteral access sheath was then advanced over the second wire and using fluoroscopic guidance advanced to the proximal ureter. Flexible ureteroscopy then took place and a large renal calculus consistent with imaging was seen in the renal pelvis.  There was significant edema of the mucosa in the renal pelvis as well as UPJ. The stone was then fragmented with the 242 micron holmium laser fiber.  Basket stone extraction then began with the larger pieces.  When reinserting the ureteroscope through the UPJ a small flap of mucosal tissue was lifted.  At this point time the decision was made to stop removing any stones via basket and place a stent.  The ureteral access sheath was removed and unison with the ureteroscope taking care to examine the ureter on the way out.  The only area of concern was the submucosal flap noted at the UPJ.    The wire was then backloaded through the cystoscope and a ureteral stent was advance over the wire using Seldinger technique.  The stent was positioned appropriately under fluoroscopic and cystoscopic guidance.  The wire was then removed with an adequate stent curl noted in the renal pelvis as well as in the bladder.  The bladder was then emptied and the procedure ended.  The patient appeared to tolerate the procedure well  and without complications.  The patient was able to be awakened and transferred to the recovery unit in satisfactory  condition.   Disposition: The patient to be discharged home today. She will be scheduled for a second surgery in 2 week for to clear residual stone burden.

## 2022-05-22 NOTE — Transfer of Care (Signed)
Immediate Anesthesia Transfer of Care Note  Patient: Jasmine [name]  Procedure(s) Performed: CYSTOSCOPY WITH RETROGRADE PYELOGRAM, URETEROSCOPY AND STENT PLACEMENT (Left) MOSES LASER APPLICATION (Left)  Patient Location: PACU  Anesthesia Type:General  Level of Consciousness: sedated  Airway & Oxygen Therapy: Patient Spontanous Breathing and Patient connected to face mask oxygen  Post-op Assessment: Report given to RN and Post -op Vital signs reviewed and stable  Post vital signs: Reviewed and stable  Last Vitals:  Vitals Value Taken Time  BP    Temp    Pulse 62 05/22/22 1105  Resp 26 05/22/22 1105  SpO2 100 % 05/22/22 1105  Vitals shown include unvalidated device data.  Last Pain:  Vitals:   05/22/22 0835  TempSrc:   PainSc: 0-No pain         Complications: No notable events documented.

## 2022-05-23 ENCOUNTER — Encounter (HOSPITAL_COMMUNITY): Payer: Self-pay | Admitting: Urology

## 2022-05-28 ENCOUNTER — Other Ambulatory Visit: Payer: Self-pay | Admitting: Urology

## 2022-05-30 ENCOUNTER — Encounter (HOSPITAL_COMMUNITY): Payer: Self-pay | Admitting: Urology

## 2022-05-30 ENCOUNTER — Other Ambulatory Visit: Payer: Self-pay

## 2022-05-30 NOTE — H&P (Signed)
CC/HPI: cc: microscopic hematuria   09/20/21: 67 yo woman referred for persistent microscopic hematuria. She was recently treated for e.coli UTI. She gets 1-2 UTIs per year. The most recent 1 was very painful to her but she is also been treated for UTI in the absence of symptoms. She does have 10-20 RBCs per high-powered field on UA today. She currently feels depressed as her sister has Alzheimer's and she is trying to coordinate her moved to [place] to be near her sister's daughter.   09/27/2021: Here for cystoscopy and CT results. CT of the abdomen pelvis shows 2 calculi in the left renal pelvis measuring up to 6 mm with inflammatory stranding as well as small lesion on the right renal sinus. Patient has persistent microscopic hematuria on urinalysis today.   11/06/21: 67 year old woman initially seen for microscopic hematuria found to have left renal calculi on upper tract imaging. There is also concern for enhancing lesion around the right renal sinus. She subsequently underwent an MRI of the kidneys which showed a cyst but no abnormal enhancement. Patient is currently undergoing [place] treatment for depression.   05/02/22: 67 year old woman initially seen for microscopic hematuria found to have left urolithiasis on imaging. She is here today for repeat KUB to assess for interval change. Left renal pelvis calculus has increased in size now approximately 15 mm. She feels like she has a UTI today. This is her fourth UTI in the last several months. She did not notice any improvement in her depression with [place].     ALLERGIES: No Allergies    MEDICATIONS: Crestor 20 mg tablet  Abilify 10 mg tablet  Alprazolam 0.5 mg tablet  Alprazolam Er 0.5 mg tablet, extended release 24 hr  Auvelity 45 mg-105 mg tablet,immed, extended release, biphasic  Desvenlafaxine Er 50 mg tablet, extended release 24 hr tablet BID  Rosuvastatin Calcium 20 mg tablet     GU PSH: Cystoscopy - 09/27/2021 Locm 300-'399Mg'$ /Ml Iodine,1Ml  - 09/22/2021       PSH Notes: Knee Replacement, Wrist Incision, fallopian tubes removed   NON-GU PSH: Revise Knee Joint - 2013     GU PMH: Microscopic hematuria - 11/06/2021, - 09/27/2021, - 09/22/2021, - 09/20/2021 Renal calculus - 11/06/2021, - 09/27/2021 Renal cyst - 11/06/2021 Ureter, Right, 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 Chronic cystitis (with hematuria) - 09/20/2021 Mixed incontinence, Urge and stress incontinence - 2014 Urinary Frequency, Increased urinary frequency - 2014 Urinary incontinence, Unspec, Urinary incontinence - 2014 Weak Urinary Stream, Weak urinary stream - 2014      PMH Notes:  1898-04-30 00:00:00 - Note: Normal Routine History And Physical Adult  2011-11-21 08:35:16 - Note: Arthritis   NON-GU PMH: Anxiety, Anxiety (Symptom) - 2014 Personal history of other diseases of the digestive system, History of esophageal reflux - 2014 Personal history of other diseases of the nervous system and sense organs, History of sleep apnea - 2014 Personal history of other mental and behavioral disorders, History of depression - 2014 Depression GERD    FAMILY HISTORY: 1 Daughter - Daughter 1 son - Son copd - Father Death In The Family Father - Runs In Family Death In The Family Mother - Runs In Family Family Health Status Number - Runs In Family multiple myeloma - Mother   SOCIAL HISTORY: Marital Status: Married Preferred Language: English; Ethnicity: Not Hispanic Or Latino; Race: White Current Smoking Status: Patient does not smoke anymore.   Tobacco Use Assessment Completed: Used Tobacco in last 30 days? Has never drank.  Drinks  2 caffeinated drinks per day.     Notes: Former smoker, Marital History - Currently Married, Caffeine Use, Being A Economist, Occupation:   REVIEW OF SYSTEMS:    GU Review Female:   Patient denies frequent urination, hard to postpone urination, burning /pain with urination, get up at night to urinate, leakage of urine, stream  starts and stops, trouble starting your stream, have to strain to urinate, and being pregnant.  Gastrointestinal (Upper):   Patient denies nausea, vomiting, and indigestion/ heartburn.  Gastrointestinal (Lower):   Patient denies diarrhea and constipation.  Constitutional:   Patient denies fever, night sweats, weight loss, and fatigue.  Skin:   Patient denies skin rash/ lesion and itching.  Eyes:   Patient denies blurred vision and double vision.  Ears/ Nose/ Throat:   Patient denies sore throat and sinus problems.  Hematologic/Lymphatic:   Patient denies swollen glands and easy bruising.  Cardiovascular:   Patient denies leg swelling and chest pains.  Respiratory:   Patient denies cough and shortness of breath.  Endocrine:   Patient denies excessive thirst.  Musculoskeletal:   Patient denies back pain and joint pain.  Neurological:   Patient denies headaches and dizziness.  Psychologic:   Patient denies depression and anxiety.   VITAL SIGNS:      05/02/2022 09:29 AM  BP 100/66 mmHg  Pulse 87 /min  Temperature 97.3 F / 36.2 C   MULTI-SYSTEM PHYSICAL EXAMINATION:    Constitutional: Well-nourished. No physical deformities. Normally developed. Good grooming.  Neck: Neck symmetrical, not swollen. Normal tracheal position.  Respiratory: No labored breathing, no use of accessory muscles.   Lymphatic: No enlargement of neck, axillae, groin.  Skin: No paleness, no jaundice, no cyanosis. No lesion, no ulcer, no rash.  Neurologic / Psychiatric: Oriented to time, oriented to place, oriented to person. No depression, no anxiety, no agitation.  Eyes: Normal conjunctivae. Normal eyelids.  Ears, Nose, Mouth, and Throat: Left ear no scars, no lesions, no masses. Right ear no scars, no lesions, no masses. Nose no scars, no lesions, no masses. Normal hearing. Normal lips.  Musculoskeletal: Normal gait and station of head and neck.     Complexity of Data:  Records Review:   Previous Patient Records,  POC Tool  Urine Test Review:   Urinalysis  X-Ray Review: KUB: Reviewed Films. Discussed With Patient. 15 mm left renal pelvis stone    PROCEDURES:         KUB - 96789  A single view of the abdomen is obtained.      Patient confirmed No Neulasta OnPro Device.           Urinalysis w/Scope Dipstick Dipstick Cont'd Micro  Color: Orange Bilirubin: Neg mg/dL WBC/hpf: 40 - 60/hpf  Appearance: Cloudy Ketones: Neg mg/dL RBC/hpf: 3 - 10/hpf  Specific Gravity: 1.025 Blood: 3+ ery/uL Bacteria: Many (>50/hpf)  pH: 6.0 Protein: 1+ mg/dL Cystals: Ca Oxalate  Glucose: Neg mg/dL Urobilinogen: 0.2 mg/dL Casts: Hyaline    Nitrites: Positive Trichomonas: Not Present    Leukocyte Esterase: 3+ leu/uL Mucous: Present      Epithelial Cells: 6 - 10/hpf      Yeast: NS (Not Seen)      Sperm: Not Present    Notes: Mixed cellular components present    ASSESSMENT:      ICD-10 Details  1 GU:   Chronic cystitis (with hematuria) - N30.21 Chronic, Stable  2   Renal calculus - N20.0 Chronic, Stable  3   Acute Cystitis/UTI -  D64.38 Acute, Uncomplicated   PLAN:            Medications New Meds: Bactrim Ds 800 mg-160 mg tablet 1 tablet PO BID   #14  0 Refill(s)  Estradiol 0.01 % cream with applicator 1 gram Transdermal Q HS Place 1 g of cream in the vagina nightly for 14 days then twice a week thereafter  #60  3 Refill(s)  Pharmacy Name:  [facility]  Address:  [place], [place] [id number]  Phone:  [phone]  Fax:  [phone]            Orders Labs CULTURE, URINE          Document Letter(s):  Created for Patient: Clinical Summary         Notes:   1. Acute cystitis: Send urine for culture today and treat empirically with Bactrim.   2. Chronic cystitis: Patient's renal calculus likely contributing as a nidus for recurrent infections however I did give her informational handout on recurrent UTIs. I recommended vaginal estradiol cream and she was counseled  regarding how to use this. In addition I recommended a bladder probiotic with d-mannose and cranberry.   3. Renal calculus: Reviewed KUB which has shown that the stone is doubled in size. She is getting frequent UTIs as well. We discussed treatment of the stone including ESWL versus ureteroscopy. After discussion of risks and benefits we decided to proceed with ureteroscopy. Risks include but are not limited to pain, bleeding, infection, stent discomfort, damage surrounding structures, need for additional procedure, need for staged procedure, ureteral stricture. Patient understand these risks and wishes to proceed. She will be scheduled for next available date.

## 2022-06-05 ENCOUNTER — Ambulatory Visit (HOSPITAL_BASED_OUTPATIENT_CLINIC_OR_DEPARTMENT_OTHER): Payer: Medicare Other | Admitting: Anesthesiology

## 2022-06-05 ENCOUNTER — Other Ambulatory Visit: Payer: Self-pay

## 2022-06-05 ENCOUNTER — Ambulatory Visit (HOSPITAL_COMMUNITY)
Admission: RE | Admit: 2022-06-05 | Discharge: 2022-06-05 | Disposition: A | Discharge status: Home / Self Care | Payer: Medicare Other | Source: Ambulatory Visit | Attending: Urology | Admitting: Urology

## 2022-06-05 ENCOUNTER — Encounter (HOSPITAL_COMMUNITY): Payer: Self-pay | Admitting: Urology

## 2022-06-05 ENCOUNTER — Ambulatory Visit (HOSPITAL_COMMUNITY): Payer: Medicare Other

## 2022-06-05 ENCOUNTER — Encounter (HOSPITAL_COMMUNITY)
Admission: RE | Disposition: A | Discharge status: Home / Self Care | Payer: Self-pay | Source: Ambulatory Visit | Attending: Urology

## 2022-06-05 ENCOUNTER — Ambulatory Visit (HOSPITAL_COMMUNITY): Payer: Medicare Other | Admitting: Anesthesiology

## 2022-06-05 DIAGNOSIS — G4730 Sleep apnea, unspecified: Secondary | ICD-10-CM | POA: Insufficient documentation

## 2022-06-05 DIAGNOSIS — N200 Calculus of kidney: Secondary | ICD-10-CM | POA: Diagnosis not present

## 2022-06-05 DIAGNOSIS — F32A Depression, unspecified: Secondary | ICD-10-CM | POA: Diagnosis not present

## 2022-06-05 DIAGNOSIS — Z87440 Personal history of urinary (tract) infections: Secondary | ICD-10-CM | POA: Diagnosis not present

## 2022-06-05 DIAGNOSIS — N201 Calculus of ureter: Secondary | ICD-10-CM | POA: Insufficient documentation

## 2022-06-05 DIAGNOSIS — R7303 Prediabetes: Secondary | ICD-10-CM

## 2022-06-05 DIAGNOSIS — N3021 Other chronic cystitis with hematuria: Secondary | ICD-10-CM | POA: Insufficient documentation

## 2022-06-05 DIAGNOSIS — Z01818 Encounter for other preprocedural examination: Secondary | ICD-10-CM

## 2022-06-05 DIAGNOSIS — N3001 Acute cystitis with hematuria: Secondary | ICD-10-CM | POA: Insufficient documentation

## 2022-06-05 DIAGNOSIS — F418 Other specified anxiety disorders: Secondary | ICD-10-CM | POA: Diagnosis not present

## 2022-06-05 DIAGNOSIS — Z87891 Personal history of nicotine dependence: Secondary | ICD-10-CM | POA: Diagnosis not present

## 2022-06-05 HISTORY — PX: HOLMIUM LASER APPLICATION: SHX5852

## 2022-06-05 HISTORY — PX: CYSTOSCOPY WITH RETROGRADE PYELOGRAM, URETEROSCOPY AND STENT PLACEMENT: SHX5789

## 2022-06-05 LAB — BASIC METABOLIC PANEL
Anion gap: 8 (ref 5–15)
BUN: 12 mg/dL (ref 8–23)
CO2: 25 mmol/L (ref 22–32)
Calcium: 9 mg/dL (ref 8.9–10.3)
Chloride: 107 mmol/L (ref 98–111)
Creatinine, Ser: 0.88 mg/dL (ref 0.44–1.00)
GFR, Estimated: >60 mL/min (ref >60)
Glucose, Bld: 95 mg/dL (ref 70–99)
Potassium: 3.3 mmol/L — ABNORMAL LOW (ref 3.5–5.1)
Sodium: 140 mmol/L (ref 135–145)

## 2022-06-05 LAB — CBC
HCT: 39.6 % (ref 36.0–46.0)
Hemoglobin: 12.6 g/dL (ref 12.0–15.0)
MCH: 27.6 pg (ref 26.0–34.0)
MCHC: 31.8 g/dL (ref 30.0–36.0)
MCV: 86.8 fL (ref 80.0–100.0)
Platelets: 147 10*3/uL — ABNORMAL LOW (ref 150–400)
RBC: 4.56 MIL/uL (ref 3.87–5.11)
RDW: 16.8 % — ABNORMAL HIGH (ref 11.5–15.5)
WBC: 3.8 10*3/uL — ABNORMAL LOW (ref 4.0–10.5)
nRBC: 0 % (ref 0.0–0.2)

## 2022-06-05 LAB — HEMOGLOBIN A1C
Hgb A1c MFr Bld: 5.5 % (ref 4.8–5.6)
Mean Plasma Glucose: 111.15 mg/dL

## 2022-06-05 SURGERY — CYSTOURETEROSCOPY, WITH RETROGRADE PYELOGRAM AND STENT INSERTION
Anesthesia: General | Laterality: Left

## 2022-06-05 MED ORDER — LIDOCAINE 2% (20 MG/ML) 5 ML SYRINGE
INTRAMUSCULAR | Status: DC | PRN
  Administered 2022-06-05: 100 mg via INTRAVENOUS

## 2022-06-05 MED ORDER — MIDAZOLAM HCL 5 MG/5ML IJ SOLN
INTRAMUSCULAR | Status: DC | PRN
  Administered 2022-06-05: 2 mg via INTRAVENOUS

## 2022-06-05 MED ORDER — EPHEDRINE SULFATE-NACL 50-0.9 MG/10ML-% IV SOSY
PREFILLED_SYRINGE | INTRAVENOUS | Status: DC | PRN
  Administered 2022-06-05: 10 mg via INTRAVENOUS
  Administered 2022-06-05: 15 mg via INTRAVENOUS

## 2022-06-05 MED ORDER — KETOROLAC TROMETHAMINE 30 MG/ML IJ SOLN
INTRAMUSCULAR | Status: DC | PRN
  Administered 2022-06-05: 15 mg via INTRAVENOUS

## 2022-06-05 MED ORDER — OXYCODONE HCL 5 MG/5ML PO SOLN: 5.0000 mg | Freq: Once | ORAL | Status: DC | PRN

## 2022-06-05 MED ORDER — OXYCODONE HCL 5 MG PO TABS: 5.0000 mg | ORAL_TABLET | Freq: Once | ORAL | Status: DC | PRN

## 2022-06-05 MED ORDER — FENTANYL CITRATE (PF) 100 MCG/2ML IJ SOLN
INTRAMUSCULAR | Status: DC | PRN
  Administered 2022-06-05: 100 ug via INTRAVENOUS

## 2022-06-05 MED ORDER — ONDANSETRON HCL 4 MG/2ML IJ SOLN
INTRAMUSCULAR | Status: AC
  Filled 2022-06-05: qty 2

## 2022-06-05 MED ORDER — DEXAMETHASONE SODIUM PHOSPHATE 10 MG/ML IJ SOLN
INTRAMUSCULAR | Status: AC
  Filled 2022-06-05: qty 1

## 2022-06-05 MED ORDER — ONDANSETRON HCL 4 MG/2ML IJ SOLN
INTRAMUSCULAR | Status: DC | PRN
  Administered 2022-06-05: 4 mg via INTRAVENOUS

## 2022-06-05 MED ORDER — SODIUM CHLORIDE 0.9 % IR SOLN
Status: DC | PRN
  Administered 2022-06-05: 3000 mL

## 2022-06-05 MED ORDER — 0.9 % SODIUM CHLORIDE (POUR BTL) OPTIME
TOPICAL | Status: DC | PRN
  Administered 2022-06-05: 1000 mL

## 2022-06-05 MED ORDER — LIDOCAINE HCL (PF) 2 % IJ SOLN
INTRAMUSCULAR | Status: AC
  Filled 2022-06-05: qty 5

## 2022-06-05 MED ORDER — ORAL CARE MOUTH RINSE: 15.0000 mL | Freq: Once | OROMUCOSAL | Status: AC

## 2022-06-05 MED ORDER — EPHEDRINE 5 MG/ML INJ
INTRAVENOUS | Status: AC
  Filled 2022-06-05: qty 5

## 2022-06-05 MED ORDER — FENTANYL CITRATE (PF) 100 MCG/2ML IJ SOLN
INTRAMUSCULAR | Status: AC
  Filled 2022-06-05: qty 2

## 2022-06-05 MED ORDER — LACTATED RINGERS IV SOLN: INTRAVENOUS | Status: DC

## 2022-06-05 MED ORDER — KETOROLAC TROMETHAMINE 30 MG/ML IJ SOLN
INTRAMUSCULAR | Status: AC
  Filled 2022-06-05: qty 1

## 2022-06-05 MED ORDER — DEXAMETHASONE SODIUM PHOSPHATE 10 MG/ML IJ SOLN
INTRAMUSCULAR | Status: DC | PRN
  Administered 2022-06-05: 5 mg via INTRAVENOUS

## 2022-06-05 MED ORDER — CEFAZOLIN SODIUM-DEXTROSE 2-4 GM/100ML-% IV SOLN
2.0000 g | INTRAVENOUS | Status: AC
  Administered 2022-06-05: 2 g via INTRAVENOUS
  Filled 2022-06-05: qty 100

## 2022-06-05 MED ORDER — CEPHALEXIN 500 MG PO CAPS: 500.0000 mg | ORAL_CAPSULE | Freq: Once | ORAL | 0 refills | Status: AC

## 2022-06-05 MED ORDER — PROPOFOL 10 MG/ML IV BOLUS
INTRAVENOUS | Status: DC | PRN
  Administered 2022-06-05: 100 mg via INTRAVENOUS
  Administered 2022-06-05: 20 mg via INTRAVENOUS

## 2022-06-05 MED ORDER — CHLORHEXIDINE GLUCONATE 0.12 % MT SOLN
15.0000 mL | Freq: Once | OROMUCOSAL | Status: AC
  Administered 2022-06-05: 15 mL via OROMUCOSAL

## 2022-06-05 MED ORDER — FENTANYL CITRATE PF 50 MCG/ML IJ SOSY: 25.0000 ug | PREFILLED_SYRINGE | INTRAMUSCULAR | Status: DC | PRN

## 2022-06-05 MED ORDER — MIDAZOLAM HCL 2 MG/2ML IJ SOLN
INTRAMUSCULAR | Status: AC
  Filled 2022-06-05: qty 2

## 2022-06-05 MED ORDER — ONDANSETRON HCL 4 MG/2ML IJ SOLN: 4.0000 mg | Freq: Four times a day (QID) | INTRAMUSCULAR | Status: DC | PRN

## 2022-06-05 SURGICAL SUPPLY — 24 items
BAG URO CATCHER STRL LF (MISCELLANEOUS) ×1 IMPLANT
BASKET ZERO TIP NITINOL 2.4FR (BASKET) IMPLANT
BSKT STON RTRVL ZERO TP 2.4FR (BASKET) ×1
CATH URETL OPEN 5X70 (CATHETERS) ×1 IMPLANT
CLOTH BEACON ORANGE TIMEOUT ST (SAFETY) ×1 IMPLANT
DRSG TEGADERM 2-3/8X2-3/4 SM (GAUZE/BANDAGES/DRESSINGS) IMPLANT
EXTRACTOR STONE 1.7FRX115CM (UROLOGICAL SUPPLIES) IMPLANT
FIBER LASER MOSES 200 DFL (Laser) IMPLANT
FIBER LASER MOSES 365 DFL (Laser) IMPLANT
GLOVE BIO SURGEON STRL SZ 6.5 (GLOVE) ×1 IMPLANT
GOWN STRL REUS W/ TWL LRG LVL3 (GOWN DISPOSABLE) ×1 IMPLANT
GOWN STRL REUS W/TWL LRG LVL3 (GOWN DISPOSABLE) ×1
GUIDEWIRE STR DUAL SENSOR (WIRE) ×1 IMPLANT
KIT TURNOVER KIT A (KITS) IMPLANT
LASER FIB FLEXIVA PULSE ID 365 (Laser) IMPLANT
MANIFOLD NEPTUNE II (INSTRUMENTS) ×1 IMPLANT
PACK CYSTO (CUSTOM PROCEDURE TRAY) ×1 IMPLANT
SHEATH NAVIGATOR HD 11/13X28 (SHEATH) IMPLANT
SHEATH NAVIGATOR HD 11/13X36 (SHEATH) IMPLANT
STENT URET 6FRX26 CONTOUR (STENTS) IMPLANT
TRACTIP FLEXIVA PULS ID 200XHI (Laser) IMPLANT
TRACTIP FLEXIVA PULSE ID 200 (Laser)
TUBING CONNECTING 10 (TUBING) ×1 IMPLANT
TUBING UROLOGY SET (TUBING) ×1 IMPLANT

## 2022-06-05 NOTE — Interval H&P Note (Signed)
History and Physical Interval Note: She previously underwent right ureteroscopy with laser lithotripsy and stent placement.  She now returns to remove stone fragments.    06/05/2022 2:14 PM  Jasmine [name]  has presented today for surgery, with the diagnosis of LEFT RENAL CALCULUS.  The various methods of treatment have been discussed with the patient and family. After consideration of risks, benefits and other options for treatment, the patient has consented to  Procedure(s) with comments: CYSTOSCOPY WITH RETROGRADE PYELOGRAM, URETEROSCOPY AND STENT EXCHANGE (Left) - 1 HR HOLMIUM LASER APPLICATION (Left) as a surgical intervention.  The patient's history has been reviewed, patient examined, no change in status, stable for surgery.  I have reviewed the patient's chart and labs.  Questions were answered to the patient's satisfaction.     Yaelis Scharfenberg D Greyden Besecker

## 2022-06-05 NOTE — Anesthesia Procedure Notes (Signed)
Procedure Name: LMA Insertion Date/Time: 06/05/2022 3:06 PM  Performed by: Lind Covert, CRNAPre-anesthesia Checklist: Patient identified, Emergency Drugs available, Suction available, Patient being monitored and Timeout performed Patient Re-evaluated:Patient Re-evaluated prior to induction Oxygen Delivery Method: Circle system utilized Preoxygenation: Pre-oxygenation with 100% oxygen Induction Type: IV induction LMA: LMA inserted LMA Size: 4.0 Tube type: Oral Number of attempts: 1 Placement Confirmation: positive ETCO2 and breath sounds checked- equal and bilateral Tube secured with: Tape Dental Injury: Teeth and Oropharynx as per pre-operative assessment

## 2022-06-05 NOTE — Anesthesia Preprocedure Evaluation (Signed)
Anesthesia Evaluation  Patient identified by MRN, date of birth, ID band Patient awake    Reviewed: Allergy & Precautions, H&P , NPO status , Patient's Chart, lab work & pertinent test results  Airway Mallampati: II   Neck ROM: full    Dental   Pulmonary sleep apnea , former smoker   breath sounds clear to auscultation       Cardiovascular negative cardio ROS  Rhythm:regular Rate:Normal     Neuro/Psych  PSYCHIATRIC DISORDERS Anxiety Depression       GI/Hepatic ,GERD  ,,  Endo/Other    Renal/GU stones     Musculoskeletal  (+) Arthritis ,    Abdominal   Peds  Hematology   Anesthesia Other Findings   Reproductive/Obstetrics                             Anesthesia Physical Anesthesia Plan  ASA: 2  Anesthesia Plan: General   Post-op Pain Management:    Induction: Intravenous  PONV Risk Score and Plan: 3 and Ondansetron, Dexamethasone, Treatment may vary due to age or medical condition and Midazolam  Airway Management Planned: LMA  Additional Equipment:   Intra-op Plan:   Post-operative Plan: Extubation in OR  Informed Consent: I have reviewed the patients History and Physical, chart, labs and discussed the procedure including the risks, benefits and alternatives for the proposed anesthesia with the patient or authorized representative who has indicated his/her understanding and acceptance.     Dental advisory given  Plan Discussed with: CRNA, Anesthesiologist and Surgeon  Anesthesia Plan Comments:        Anesthesia Quick Evaluation

## 2022-06-05 NOTE — Transfer of Care (Signed)
Immediate Anesthesia Transfer of Care Note  Patient: Jasmine [name]  Procedure(s) Performed: Procedure(s) with comments: CYSTOSCOPY WITH RETROGRADE PYELOGRAM, URETEROSCOPY AND STENT EXCHANGE (Left) - 1 HR HOLMIUM LASER APPLICATION (Left)  Patient Location: PACU  Anesthesia Type:General  Level of Consciousness: Alert, Awake, Oriented  Airway & Oxygen Therapy: Patient Spontanous Breathing  Post-op Assessment: Report given to RN  Post vital signs: Reviewed and stable  Last Vitals:  Vitals:   06/05/22 1358  BP: (!) 148/83  Pulse: 79  Resp: 16  Temp: 36.7 C  SpO2: 94%    Complications: No apparent anesthesia complications

## 2022-06-05 NOTE — Discharge Instructions (Signed)
DISCHARGE INSTRUCTIONS FOR KIDNEY STONE/URETERAL STENT   MEDICATIONS:  1. Resume all your other meds from home  2. AZO over the counter can help with the burning/stinging when you urinate. 3. Tramadol is for moderate/severe pain, otherwise taking up to 1000 mg every 6 hours of plainTylenol will help treat your pain.   4. Take Cephalexin one hour prior to removal of your stent.    ACTIVITY:  1. No strenuous activity x 1week  2. No driving while on narcotic pain medications  3. Drink plenty of water  4. Continue to walk at home - you can still get blood clots when you are at home, so keep active, but don't over do it.  5. May return to work/school tomorrow or when you feel ready   BATHING:  1. You can shower and we recommend daily showers  2. You have a string coming from your urethra: The stent string is attached to your ureteral stent. Do not pull on this.   SIGNS/SYMPTOMS TO CALL:  Please call us if you have a fever greater than 101.5, uncontrolled nausea/vomiting, uncontrolled pain, dizziness, unable to urinate, bloody urine, chest pain, shortness of breath, leg swelling, leg pain, redness around wound, drainage from wound, or any other concerns or questions.   You can reach [name] at [phone].   FOLLOW-UP:  1. You have a string attached to your stent, you may remove it on Monday, February 12. To do this, pull the string until the stent is completely removed. You may feel an odd sensation in your back.

## 2022-06-05 NOTE — Op Note (Signed)
Preoperative diagnosis: left renal calculus  Postoperative diagnosis: left renal calculus  Procedure:  Cystoscopy left ureteroscopy, laser lithotripsy, basket stone extraction left 55F x 26cm ureteral stent exchange - no tether   Surgeon: Jacalyn Lefevre, MD  Anesthesia: General  Complications: None  Intraoperative findings:  Normal urethra Bilateral orthotropic ureteral orifices Bladder mucosa normal without masses   EBL: Minimal  Specimens: left renal calculus  Disposition of specimens: [facility] for stone analysis  Indication: Jasmine [name] is a 67 y.o.   patient with a 15 mm left ureteral stone and associated left symptoms who returns for second stage ureteroscopy. After reviewing the management options for treatment, the patient elected to proceed with the above surgical procedure(s). We have discussed the potential benefits and risks of the procedure, side effects of the proposed treatment, the likelihood of the patient achieving the goals of the procedure, and any potential problems that might occur during the procedure or recuperation. Informed consent has been obtained.   Description of procedure:  The patient was taken to the operating room and general anesthesia was induced.  The patient was placed in the dorsal lithotomy position, prepped and draped in the usual sterile fashion, and preoperative antibiotics were administered. A preoperative time-out was performed.   Cystourethroscopy was performed.  The patient's urethra was examined and was normal.  The bladder was then systematically examined in its entirety. There was no evidence for any bladder tumors, stones, or other mucosal pathology.    Attention then turned to the left ureteral orifice and graspers were used to bring the existing ureteral stent to the urethral meatus.  A wire was then advanced through the ureteral stent and up to the kidney with fluoroscopic guidance.  The stent was  removed and examined and deemed to be intact.  It was then discarded.  A second wire was placed alongside the first wire and advanced the kidney with fluoroscopic guidance.  The cystoscope was removed.  1 wire was secured as a safety wire.  A ureteral access sheath was then placed over the second wire and advanced to the proximal ureter with fluoroscopic guidance.  The inner sheath and wire removed.  Next flexible ureteroscopy took place.  There were a conglomeration of stone fragments from prior ureteroscopy.  The stone fragments were then fragmented with the 200 micron holmium laser fiber.    Basketing then took place until visualization became poor.  There were some small fragments too small to basket remaining the lower pole.  Reinspection of the ureter revealed no remaining visible stones or fragments.  The ureteroscope was removed and unison with the ureteral access sheath taking care to examine the ureter on the way out.  No trauma was noted.  The wire was then backloaded through the cystoscope and a ureteral stent was advance over the wire using Seldinger technique.  The stent was positioned appropriately under fluoroscopic and cystoscopic guidance.  The wire was then removed with an adequate stent curl noted in the renal pelvis as well as in the bladder.  The bladder was then emptied and the procedure ended.  The patient appeared to tolerate the procedure well and without complications.  The patient was able to be awakened and transferred to the recovery unit in satisfactory condition.   Disposition: The tether of the stent was left on and tucked inside the patient's vagina.  She does have some small remaining stone fragment in renal pelvis but fragments are all small. Instructions for removing the stent  have been provided to the patient.

## 2022-06-06 ENCOUNTER — Encounter (HOSPITAL_COMMUNITY): Payer: Self-pay | Admitting: Urology

## 2022-06-06 NOTE — Anesthesia Postprocedure Evaluation (Signed)
Anesthesia Post Note  Patient: Jasmine [name]  Procedure(s) Performed: CYSTOSCOPY WITH RETROGRADE PYELOGRAM, URETEROSCOPY AND STENT EXCHANGE (Left) HOLMIUM LASER APPLICATION (Left)     Patient location during evaluation: PACU Anesthesia Type: General Level of consciousness: awake and alert Pain management: pain level controlled Vital Signs Assessment: post-procedure vital signs reviewed and stable Respiratory status: spontaneous breathing, nonlabored ventilation, respiratory function stable and patient connected to nasal cannula oxygen Cardiovascular status: blood pressure returned to baseline and stable Postop Assessment: no apparent nausea or vomiting Anesthetic complications: no   No notable events documented.  Last Vitals:  Vitals:   06/05/22 1715 06/05/22 1724  BP: (!) 143/70 (!) 154/75  Pulse: 73 78  Resp: 13   Temp: (!) 36.4 C   SpO2: 93% 99%    Last Pain:  Vitals:   06/05/22 1724  TempSrc:   PainSc: 0-No pain                 Steffi Noviello S

## 2022-10-22 ENCOUNTER — Other Ambulatory Visit (HOSPITAL_COMMUNITY)
Admission: RE | Admit: 2022-10-22 | Discharge: 2022-10-22 | Disposition: A | Discharge status: Home / Self Care | Payer: Medicare Other | Source: Ambulatory Visit | Attending: Obstetrics & Gynecology | Admitting: Obstetrics & Gynecology

## 2022-10-22 ENCOUNTER — Ambulatory Visit (HOSPITAL_BASED_OUTPATIENT_CLINIC_OR_DEPARTMENT_OTHER): Payer: Medicare Other | Admitting: Obstetrics & Gynecology

## 2022-10-22 ENCOUNTER — Encounter (HOSPITAL_BASED_OUTPATIENT_CLINIC_OR_DEPARTMENT_OTHER): Payer: Self-pay | Admitting: Obstetrics & Gynecology

## 2022-10-22 VITALS — BP 112/63 | HR 96 | Ht 67.0 in | Wt 193.2 lb

## 2022-10-22 DIAGNOSIS — R3915 Urgency of urination: Secondary | ICD-10-CM | POA: Diagnosis not present

## 2022-10-22 DIAGNOSIS — Z1231 Encounter for screening mammogram for malignant neoplasm of breast: Secondary | ICD-10-CM | POA: Diagnosis not present

## 2022-10-22 DIAGNOSIS — Z1151 Encounter for screening for human papillomavirus (HPV): Secondary | ICD-10-CM | POA: Diagnosis not present

## 2022-10-22 DIAGNOSIS — Z780 Asymptomatic menopausal state: Secondary | ICD-10-CM | POA: Diagnosis not present

## 2022-10-22 DIAGNOSIS — Z01419 Encounter for gynecological examination (general) (routine) without abnormal findings: Secondary | ICD-10-CM

## 2022-10-22 DIAGNOSIS — Z124 Encounter for screening for malignant neoplasm of cervix: Secondary | ICD-10-CM | POA: Diagnosis not present

## 2022-10-22 LAB — POCT URINALYSIS DIPSTICK
Appearance: NORMAL
Bilirubin, UA: NEGATIVE
Blood, UA: NEGATIVE
Glucose, UA: NEGATIVE
Nitrite, UA: POSITIVE
Odor: ABNORMAL
Protein, UA: POSITIVE — AB
Spec Grav, UA: 1.02 (ref 1.010–1.025)
Urobilinogen, UA: 1 E.U./dL
pH, UA: 6.5 (ref 5.0–8.0)

## 2022-10-22 MED ORDER — NITROFURANTOIN MONOHYD MACRO 100 MG PO CAPS: 100.0000 mg | ORAL_CAPSULE | Freq: Two times a day (BID) | ORAL | 0 refills | Status: DC

## 2022-10-22 NOTE — Progress Notes (Unsigned)
67 y.o. [id number] Married White or Caucasian female here for breast and pelvic exam.  I am also following her for h/o recurrent UTis as well.  She feels like she has one today.  Has been followed by urology this year due to microscopic hematuria.  Has undergone two procedures with Dr. Estrellita Ludwig this year as well in Jan and Feb.  She doesn't feel her UTI frequency has improved.  Denies vaginal bleeding.  Has three grandchildren now with her daughter's family and son got married last year as well.  Feel they will likely start their family soon.  Patient's last menstrual period was 01/28/2010.          Sexually active: No.  H/O STD:  no  Health Maintenance: PCP:  Dr. Patsy Lager.   Vaccines are up to date:  no Colonoscopy:  10/09/2017, follow up 7 years MMG:  11/24/2021 Negative BMD:  11/24/2020 Last pap smear:  09/20/2020 Negative.     reports that she quit smoking about 5 years ago. Her smoking use included cigarettes. She smoked an average of .5 packs per day. She has never used smokeless tobacco. She reports that she does not currently use alcohol. She reports that she does not use drugs.  Past Medical History:  Diagnosis Date   Anemia    Anginal pain (HCC)    Anxiety    Arthritis    Chest pain    Depression    Diverticulosis    GERD (gastroesophageal reflux disease)    tums only PRN   History of diverticulitis    History of kidney stones    Ovarian cyst    Prediabetes    Recurrent UTI    Sleep apnea    no CPAP    Past Surgical History:  Procedure Laterality Date   BLADDER SUSPENSION N/A 04/24/2013   Procedure: TRANSVAGINAL TAPE (TVT) Exact PROCEDURE;  Surgeon: Robley Fries, MD;  Location: [facility];  Service: Gynecology;  Laterality: N/A;   CARPAL TUNNEL RELEASE Bilateral 1997   CATARACT EXTRACTION, BILATERAL     COLONOSCOPY     CYSTOSCOPY N/A 04/24/2013   Procedure: CYSTOSCOPY;  Surgeon: Robley Fries, MD;  Location: [facility];  Service: Gynecology;  Laterality: N/A;    CYSTOSCOPY WITH RETROGRADE PYELOGRAM, URETEROSCOPY AND STENT PLACEMENT Left 05/22/2022   Procedure: CYSTOSCOPY WITH RETROGRADE PYELOGRAM, URETEROSCOPY AND STENT PLACEMENT;  Surgeon: Noel Christmas, MD;  Location: [facility];  Service: Urology;  Laterality: Left;   CYSTOSCOPY WITH RETROGRADE PYELOGRAM, URETEROSCOPY AND STENT PLACEMENT Left 06/05/2022   Procedure: CYSTOSCOPY WITH RETROGRADE PYELOGRAM, URETEROSCOPY AND STENT EXCHANGE;  Surgeon: Noel Christmas, MD;  Location: [facility];  Service: Urology;  Laterality: Left;  1 HR   GIVENS CAPSULE STUDY N/A 10/21/2017   Procedure: GIVENS CAPSULE STUDY;  Surgeon: Sherrilyn Rist, MD;  Location: [facility] ENDOSCOPY;  Service: Gastroenterology;  Laterality: N/A;   HOLMIUM LASER APPLICATION Left 05/22/2022   Procedure: MOSES LASER APPLICATION;  Surgeon: Noel Christmas, MD;  Location: [facility];  Service: Urology;  Laterality: Left;   HOLMIUM LASER APPLICATION Left 06/05/2022   Procedure: HOLMIUM LASER APPLICATION;  Surgeon: Noel Christmas, MD;  Location: [facility];  Service: Urology;  Laterality: Left;   LAPAROSCOPY Bilateral 05/03/2014   Procedure: LAPAROSCOPIC BILATERAL SALPINGO OOPHORECTOMY;  Surgeon: Annamaria Boots, MD;  Location: [facility];  Service: Gynecology;  Laterality: Bilateral;   MOUTH SURGERY     TOTAL KNEE ARTHROPLASTY Bilateral 04/2010    Current Outpatient Medications  Medication Sig Dispense Refill   acetaminophen (TYLENOL) 500 MG tablet Take 500 mg by mouth every 6 (six) hours as needed (for pain.).     ALPRAZolam (XANAX) 0.5 MG tablet Take 0.5-1 mg by mouth See admin instructions. 0.5 mg during the day as needed, and taking 1 mg at bedtime every night     ARIPiprazole (ABILIFY) 10 MG tablet Take 10 mg by mouth every morning.     ibuprofen (ADVIL) 200 MG tablet Take 600 mg by mouth every 6 (six) hours as needed (pain.).     Methenamine-Sodium Salicylate (AZO URINARY TRACT DEFENSE PO) Take 1 tablet by mouth daily. With D-Mannose      nitrofurantoin, macrocrystal-monohydrate, (MACROBID) 100 MG capsule Take 1 capsule (100 mg total) by mouth 2 (two) times daily. 10 capsule 0   rosuvastatin (CRESTOR) 20 MG tablet Take 1 tablet (20 mg total) by mouth daily. 90 tablet 3   No current facility-administered medications for this visit.    Family History  Problem Relation Age of Onset   Multiple myeloma Mother    Early death Mother    Heart attack Father    Alcohol abuse Father    COPD Father    Cancer Maternal Grandmother    Alcohol abuse Maternal Grandfather    Depression Maternal Grandfather    Heart disease Paternal Grandmother    Colon cancer Neg Hx    Stomach cancer Neg Hx    Esophageal cancer Neg Hx    Rectal cancer Neg Hx     Review of Systems  Constitutional: Negative.   Genitourinary:  Positive for dysuria and urgency.    Exam:   BP 112/63 (BP Location: Right Arm, Patient Position: Sitting, Cuff Size: Large)   Pulse 96   Ht 5\' 7"  (1.702 m) Comment: reported  Wt 193 lb 3.2 oz (87.6 kg)   LMP 01/28/2010   BMI 30.26 kg/m   Height: 5\' 7"  (170.2 cm) (reported)  General appearance: alert, cooperative and appears stated age Breasts: normal appearance, no masses or tenderness Abdomen: soft, non-tender; bowel sounds normal; no masses,  no organomegaly Lymph nodes: Cervical, supraclavicular, and axillary nodes normal.  No abnormal inguinal nodes palpated Neurologic: Grossly normal  Pelvic: External genitalia:  no lesions              Urethra:  normal appearing urethra with no masses, tenderness or lesions              Bartholins and Skenes: normal                 Vagina: normal appearing vagina with atrophic changes and no discharge, no lesions              Cervix: no lesions              Pap taken: Yes.   Bimanual Exam:  Uterus:  normal size, contour, position, consistency, mobility, non-tender              Adnexa: normal adnexa and no mass, fullness, tenderness               Rectovaginal: Confirms                Anus:  normal sphincter tone, no lesions  Chaperone, Ina Homes, CMA, was present for exam.  Assessment/Plan: 1. Encntr for gyn exam (general) (routine) w/o abn findings - Pap smear obtained today - Mammogram ordered.  Is due in July. - Colonoscopy 10/09/2017, follow up 7 years -  Bone mineral density 10/2020 - no lab work ordered - vaccines reviewed/updated  2. Urinary urgency - Urine Culture - POCT urinalysis dipstick - rx for macrobid 100mg  bid x 5 days to pharmacy as POCT appears positive for UTI  3. Encounter for screening mammogram for malignant neoplasm of breast - MM 3D SCREENING MAMMOGRAM BILATERAL BREAST; Future  4. Cervical cancer screening - Cytology - PAP( [place]) - PR OBTAINING SCREEN PAP SMEAR  5. Postmenopausal - not on HRT

## 2022-10-25 LAB — URINE CULTURE

## 2022-10-29 LAB — CYTOLOGY - PAP
Comment: NEGATIVE
Diagnosis: UNDETERMINED — AB
High risk HPV: NEGATIVE

## 2022-12-01 ENCOUNTER — Other Ambulatory Visit: Payer: Self-pay | Admitting: Cardiovascular Disease

## 2023-02-13 ENCOUNTER — Encounter (HOSPITAL_BASED_OUTPATIENT_CLINIC_OR_DEPARTMENT_OTHER): Payer: Self-pay | Admitting: Obstetrics & Gynecology

## 2023-02-26 ENCOUNTER — Encounter: Payer: Self-pay | Admitting: Psychology

## 2023-05-29 ENCOUNTER — Other Ambulatory Visit: Payer: Self-pay | Admitting: Cardiovascular Disease

## 2023-05-31 ENCOUNTER — Telehealth: Payer: Self-pay

## 2023-05-31 MED ORDER — ROSUVASTATIN CALCIUM 20 MG PO TABS: 20.0000 mg | ORAL_TABLET | Freq: Every day | ORAL | 0 refills | Status: DC

## 2023-05-31 NOTE — Telephone Encounter (Signed)
Received refill request for Rosuvastatin and pt is more than 1.5 yrs out from last office visit. Called and spoke with patient's husband and got pt scheduled for next month. Refill sent in at this time to carry through time of the appt.

## 2023-06-04 ENCOUNTER — Ambulatory Visit (INDEPENDENT_AMBULATORY_CARE_PROVIDER_SITE_OTHER): Payer: Medicare Other | Admitting: Neurology

## 2023-06-04 VITALS — BP 112/68 | HR 93 | Ht 67.0 in | Wt 204.5 lb

## 2023-06-04 DIAGNOSIS — F32A Depression, unspecified: Secondary | ICD-10-CM | POA: Diagnosis not present

## 2023-06-04 DIAGNOSIS — R4189 Other symptoms and signs involving cognitive functions and awareness: Secondary | ICD-10-CM | POA: Insufficient documentation

## 2023-06-04 NOTE — Progress Notes (Signed)
 Chief Complaint  Patient presents with   New Patient (Initial Visit)    Rm14, husband present, referral for Major Neurocognitive Disorder / Elna Lo MD [facility] [phone]: moca 19      ASSESSMENT AND PLAN  Jasmine [name] is a 68 y.o. female   Slow worsening cognitive impairment History of severe depression anxiety, had ECT treatment about 20 years ago  Neuropsychology evaluation by Dr.Jenna Renfore showed cognitive impairment suggestive of Alzheimer's disease,  MoCA examination today 19/30,  Complete evaluation with MRI of the brain, laboratory evaluation first,  May consider further evaluation for Alzheimer's pathology such as amyloid PET scan, ATN profile, APOE genetic variant,    DIAGNOSTIC DATA (LABS, IMAGING, TESTING) - I reviewed patient records, labs, notes, testing and imaging myself where available.   MEDICAL HISTORY:  Jasmine [name] is a 68 year old female, seen in request by her psychiatrist Dr. Lo, Elna for evaluation of cognitive malfunction, her primary care physician is Dr. Cleotilde, Ronal RAMAN, initial evaluation was on February 4th 2025  History is obtained from the patient and review of electronic medical records. I personally reviewed pertinent available imaging films in PACS.   PMHx of  HLD Depression, on effexor 225mg  since 2024, abilify since 2023,   She has long history of depression anxiety, even received ECT treatment 20 years ago, but over the years, her mood disorder is under excellent control with medications, she retired from her own business of estate sale around age 54  Since 2022, she was noted to have gradual change, loss of interest, no longer social with her friends, difficult to carry on a conversation, was treated by Dr. Lo over past one year, tried different medications, eventually settled on current Effexor, and Abilify around 2023, there was some improvement of her mood, she began to go out, interact with people  some  But she is far from her baseline, not like to initiate conversation, lost change of the thought in the middle of the conversation, sedentary, watch TV most of the time, her older sister at age 32  suffered severe Alzheimer's disease, required nursing home  She already had neuropsychology evaluation by Dr. 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  PHYSICAL EXAMNIATION:  Gen: NAD, conversant, well nourised, well groomed                     Cardiovascular: Regular rate rhythm, no peripheral edema, warm, nontender. Eyes: Conjunctivae clear without exudates or hemorrhage Neck: Supple, no carotid bruits. Pulmonary: Clear to auscultation bilaterally   NEUROLOGICAL EXAM:  MENTAL STATUS: Speech/cognition: Rely on her husband to provide history, cooperative on examination    06/04/2023    8:25 AM  Montreal Cognitive Assessment   Visuospatial/ Executive (0/5) 5  Naming (0/3) 3  Attention: Read list of digits (0/2) 2  Attention: Read list of letters (0/1) 1  Attention: Serial 7 subtraction  starting at 100 (0/3) 1  Language: Repeat phrase (0/2) 1  Language : Fluency (0/1) 1  Abstraction (0/2) 2  Delayed Recall (0/5) 0  Orientation (0/6) 3  Total 19    CRANIAL NERVES: CN II: Visual fields  are full to confrontation. Pupils are round equal and briskly reactive to light. CN III, IV, VI: extraocular movement are normal. No ptosis. CN V: Facial sensation is intact to light touch CN VII: Face is symmetric with normal eye closure  CN VIII: Hearing is normal to causal conversation. CN IX, X: Phonation is normal. CN XI: Head turning and shoulder shrug are intact  MOTOR: There is no pronator drift of out-stretched arms. Muscle bulk and tone are normal. Muscle strength is normal.  REFLEXES: Reflexes are 2+ and symmetric at the biceps, triceps, knees, and ankles. Plantar responses are flexor.  SENSORY: Intact to light touch, pinprick and vibratory sensation are intact in fingers and toes.  COORDINATION: There is no trunk or limb dysmetria noted.  GAIT/STANCE: Posture is normal. Gait is steady  REVIEW OF SYSTEMS:  Full 14 system review of systems performed and notable only for as above All other review of systems were negative.   ALLERGIES: Allergies  Allergen Reactions   Trintellix [Vortioxetine] Itching and Rash    HOME MEDICATIONS: Current Outpatient Medications  Medication Sig Dispense Refill   acetaminophen  (TYLENOL ) 500 MG tablet Take 500 mg by mouth every 6 (six) hours as needed (for pain.).     ALPRAZolam (XANAX) 0.5 MG tablet Take 0.5-1 mg by mouth See admin instructions. 0.5 mg during the day as needed, and taking 1 mg at bedtime every night     ARIPiprazole (ABILIFY) 2 MG tablet Take 2 mg by mouth daily.     ibuprofen  (ADVIL ) 200 MG tablet Take 600 mg by mouth every 6 (six) hours as needed (pain.).     memantine (NAMENDA XR) 28 MG CP24 24 hr capsule Take 28 mg by mouth every morning.     Methenamine-Sodium Salicylate (AZO URINARY TRACT DEFENSE PO) Take 1 tablet by mouth daily. With D-Mannose     rosuvastatin  (CRESTOR ) 20 MG tablet Take 1 tablet (20 mg total) by mouth daily. Must keep upcoming appt for additional refills 30 tablet 0   venlafaxine XR  (EFFEXOR-XR) 75 MG 24 hr capsule Take 225 mg by mouth at bedtime.     No current facility-administered medications for this visit.    PAST MEDICAL HISTORY: Past Medical History:  Diagnosis Date   Anemia    Anginal pain (HCC)    Anxiety    Arthritis    Chest pain    Depression    Diverticulosis    GERD (gastroesophageal reflux disease)    tums only PRN   History of diverticulitis    History of kidney stones    Ovarian cyst    Prediabetes    Recurrent UTI    Sleep apnea    no CPAP    PAST SURGICAL HISTORY: Past Surgical History:  Procedure Laterality Date   BLADDER SUSPENSION N/A 04/24/2013   Procedure: TRANSVAGINAL TAPE (TVT) Exact PROCEDURE;  Surgeon: Robbi JONELLE Render, MD;  Location: [facility];  Service: Gynecology;  Laterality: N/A;   CARPAL TUNNEL RELEASE Bilateral 1997   CATARACT EXTRACTION, BILATERAL     COLONOSCOPY     CYSTOSCOPY N/A 04/24/2013   Procedure: CYSTOSCOPY;  Surgeon: Robbi JONELLE Render, MD;  Location: [facility];  Service: Gynecology;  Laterality: N/A;   CYSTOSCOPY WITH RETROGRADE PYELOGRAM,  URETEROSCOPY AND STENT PLACEMENT Left 05/22/2022   Procedure: CYSTOSCOPY WITH RETROGRADE PYELOGRAM, URETEROSCOPY AND STENT PLACEMENT;  Surgeon: Elisabeth Valli BIRCH, MD;  Location: [facility];  Service: Urology;  Laterality: Left;   CYSTOSCOPY WITH RETROGRADE PYELOGRAM, URETEROSCOPY AND STENT PLACEMENT Left 06/05/2022   Procedure: CYSTOSCOPY WITH RETROGRADE PYELOGRAM, URETEROSCOPY AND STENT EXCHANGE;  Surgeon: Elisabeth Valli BIRCH, MD;  Location: [facility];  Service: Urology;  Laterality: Left;  1 HR   GIVENS CAPSULE STUDY N/A 10/21/2017   Procedure: GIVENS CAPSULE STUDY;  Surgeon: Legrand Victory LITTIE DOUGLAS, MD;  Location: [facility];  Service: Gastroenterology;  Laterality: N/A;   HOLMIUM LASER APPLICATION Left 05/22/2022   Procedure: MOSES LASER APPLICATION;  Surgeon: Elisabeth Valli BIRCH, MD;  Location: [facility];  Service: Urology;  Laterality: Left;   HOLMIUM LASER APPLICATION Left 06/05/2022    Procedure: HOLMIUM LASER APPLICATION;  Surgeon: Elisabeth Valli BIRCH, MD;  Location: [facility];  Service: Urology;  Laterality: Left;   LAPAROSCOPY Bilateral 05/03/2014   Procedure: LAPAROSCOPIC BILATERAL SALPINGO OOPHORECTOMY;  Surgeon: Ronal Elvie Pinal, MD;  Location: [facility];  Service: Gynecology;  Laterality: Bilateral;   MOUTH SURGERY     TOTAL KNEE ARTHROPLASTY Bilateral 04/2010    FAMILY HISTORY: Family History  Problem Relation Age of Onset   Multiple myeloma Mother    Early death Mother    Heart attack Father    Alcohol abuse Father    COPD Father    Cancer Maternal Grandmother    Alcohol abuse Maternal Grandfather    Depression Maternal Grandfather    Heart disease Paternal Grandmother    Colon cancer Neg Hx    Stomach cancer Neg Hx    Esophageal cancer Neg Hx    Rectal cancer Neg Hx     SOCIAL HISTORY: Social History   Socioeconomic History   Marital status: Married    Spouse name: Not on file   Number of children: 2   Years of education: Not on file   Highest education level: Not on file  Occupational History   Not on file  Tobacco Use   Smoking status: Former    Current packs/day: 0.00    Types: Cigarettes    Quit date: 10/28/2016    Years since quitting: 6.6   Smokeless tobacco: Never   Tobacco comments:    1/2 PPD  Vaping Use   Vaping status: Never Used  Substance and Sexual Activity   Alcohol use: Not Currently   Drug use: No   Sexual activity: Not Currently    Partners: Male    Birth control/protection: Abstinence  Other Topics Concern   Not on file  Social History Narrative   Not on file   Social Drivers of Health   Financial Resource Strain: Low Risk  (09/04/2021)   Overall Financial Resource Strain (CARDIA)    Difficulty of Paying Living Expenses: Not hard at all  Food Insecurity: No Food Insecurity (09/04/2021)   Hunger Vital Sign    Worried About Running Out of Food in the Last Year: Never true    Ran Out of Food in the Last Year: Never  true  Transportation Needs: No Transportation Needs (09/04/2021)   PRAPARE - Administrator, Civil Service (Medical): No    Lack of Transportation (Non-Medical): No  Physical Activity: Sufficiently Active (09/04/2021)   Exercise Vital Sign    Days of Exercise per Week: 7 days    Minutes of Exercise per Session: 40 min  Stress: No Stress Concern  Present (09/04/2021)   [employer] of Occupational Health - Occupational Stress Questionnaire    Feeling of Stress : Not at all  Social Connections: Socially Integrated (09/04/2021)   Social Connection and Isolation Panel [NHANES]    Frequency of Communication with Friends and Family: More than three times a week    Frequency of Social Gatherings with Friends and Family: More than three times a week    Attends Religious Services: More than 4 times per year    Active Member of [employer] or Organizations: Yes    Attends Banker Meetings: More than 4 times per year    Marital Status: Married  Catering Manager Violence: Not At Risk (09/04/2021)   Humiliation, Afraid, Rape, and Kick questionnaire    Fear of Current or Ex-Partner: No    Emotionally Abused: No    Physically Abused: No    Sexually Abused: No      Modena Callander, M.D. Ph.D.  [facility] [address] [place], [place] [address] Ph: [phone] Fax: [phone]  CC:  Tasia Lung, MD [address] [place],  [place] [address]  Cleotilde Ronal RAMAN, MD

## 2023-06-06 ENCOUNTER — Telehealth: Payer: Self-pay | Admitting: Neurology

## 2023-06-06 ENCOUNTER — Encounter: Payer: Self-pay | Admitting: Cardiovascular Disease

## 2023-06-06 LAB — CBC WITH DIFFERENTIAL/PLATELET
Basophils Absolute: 0.1 x10E3/uL (ref 0.0–0.2)
Basos: 2 %
EOS (ABSOLUTE): 0.1 x10E3/uL (ref 0.0–0.4)
Eos: 2 %
Hematocrit: 47.8 % — ABNORMAL HIGH (ref 34.0–46.6)
Hemoglobin: 15.5 g/dL (ref 11.1–15.9)
Immature Grans (Abs): 0 x10E3/uL (ref 0.0–0.1)
Immature Granulocytes: 0 %
Lymphocytes Absolute: 1 x10E3/uL (ref 0.7–3.1)
Lymphs: 23 %
MCH: 31.8 pg (ref 26.6–33.0)
MCHC: 32.4 g/dL (ref 31.5–35.7)
MCV: 98 fL — ABNORMAL HIGH (ref 79–97)
Monocytes Absolute: 0.3 x10E3/uL (ref 0.1–0.9)
Monocytes: 8 %
Neutrophils Absolute: 2.8 x10E3/uL (ref 1.4–7.0)
Neutrophils: 65 %
Platelets: 180 x10E3/uL (ref 150–450)
RBC: 4.87 x10E6/uL (ref 3.77–5.28)
RDW: 12 % (ref 11.7–15.4)
WBC: 4.3 x10E3/uL (ref 3.4–10.8)

## 2023-06-06 LAB — ANA W/REFLEX IF POSITIVE
Anti JO-1: <0.2 AI (ref 0.0–0.9)
Anti Nuclear Antibody (ANA): POSITIVE — AB
Centromere Ab Screen: <0.2 AI (ref 0.0–0.9)
Chromatin Ab SerPl-aCnc: 0.3 AI (ref 0.0–0.9)
ENA RNP Ab: 2 AI — ABNORMAL HIGH (ref 0.0–0.9)
ENA SM Ab Ser-aCnc: 0.3 AI (ref 0.0–0.9)
ENA SSA (RO) Ab: <0.2 AI (ref 0.0–0.9)
ENA SSB (LA) Ab: <0.2 AI (ref 0.0–0.9)
Scleroderma (Scl-70) (ENA) Antibody, IgG: <0.2 AI (ref 0.0–0.9)
dsDNA Ab: 3 [IU]/mL (ref 0–9)

## 2023-06-06 LAB — COMPREHENSIVE METABOLIC PANEL WITH GFR
ALT: 62 IU/L — ABNORMAL HIGH (ref 0–32)
AST: 36 IU/L (ref 0–40)
Albumin: 4.2 g/dL (ref 3.9–4.9)
Alkaline Phosphatase: 89 IU/L (ref 44–121)
BUN/Creatinine Ratio: 14 (ref 12–28)
BUN: 14 mg/dL (ref 8–27)
Bilirubin Total: 0.6 mg/dL (ref 0.0–1.2)
CO2: 23 mmol/L (ref 20–29)
Calcium: 9.4 mg/dL (ref 8.7–10.3)
Chloride: 106 mmol/L (ref 96–106)
Creatinine, Ser: 1.02 mg/dL — ABNORMAL HIGH (ref 0.57–1.00)
Globulin, Total: 2 g/dL (ref 1.5–4.5)
Glucose: 98 mg/dL (ref 70–99)
Potassium: 3.8 mmol/L (ref 3.5–5.2)
Sodium: 141 mmol/L (ref 134–144)
Total Protein: 6.2 g/dL (ref 6.0–8.5)
eGFR: 60 mL/min/1.73

## 2023-06-06 LAB — SEDIMENTATION RATE: Sed Rate: 2 mm/h (ref 0–40)

## 2023-06-06 LAB — TSH: TSH: 4.25 u[IU]/mL (ref 0.450–4.500)

## 2023-06-06 LAB — VITAMIN B12: Vitamin B-12: 226 pg/mL — ABNORMAL LOW (ref 232–1245)

## 2023-06-06 LAB — RPR: RPR Ser Ql: NONREACTIVE

## 2023-06-06 LAB — C-REACTIVE PROTEIN: CRP: <1 mg/L (ref 0–10)

## 2023-06-06 NOTE — Telephone Encounter (Signed)
 no auth required sent to [facility] [phone]

## 2023-06-06 NOTE — Progress Notes (Signed)
 Cardiology Office Note:    Date:  06/07/2023   ID:  Jasmine, [name] 13-Sep-1955, MRN [id number]  PCP:  Cleotilde Ronal RAMAN, MD   [facility] Providers Cardiologist:  Askari Kinley   Referring MD: Cleotilde Ronal RAMAN, MD   Chief Complaint  Patient presents with   Shortness of Breath     History of Present Illness: 12/06/21   Jasmine [name] is a 68 y.o. female with a hx of OSA, anxiety.  She was recently at the dentist and the xrays revealed some calcification of the carotid arteries.    Also has had some chest pain last week. No previous stroke symptoms No syncope, presyncope  Gets some exercise, not enough Does her normal chores without any cp , dyspnea  A week ago  She work up with CP , ( right upper chest pain )  Got up out of bed, walked aroud a bit Last 5-6 hours  Went to the ER ,tightness No radiation to jaw or arm, Worse when she leaned over to reach to pick up something with her right arm   Workup in the emergency room was unremarkable.  Her troponins were 3, 3.  EKG revealed some nonspecific changes but nothing acute.   Nov. 7, 2023   Jasmine [name] is see for follow up of her carotid artery calcification and chest pain  Is not exercising much   Coronary calcium  score is 0. She had mild plaque in her LAD.  Otherwise her coronary arteries are unremarkable.  LDL was 59 ( nov. 1, 2023)     Feb. 7, 2025 Jasmine [name] is seen for follow up of her carotid calcification CAC score was 0 Has mild LAD plaque  Walks infrequently  No CP     Past Medical History:  Diagnosis Date   Anemia    Anginal pain (HCC)    Anxiety    Arthritis    Chest pain    Depression    Diverticulosis    GERD (gastroesophageal reflux disease)    tums only PRN   History of diverticulitis    History of kidney stones    Ovarian cyst    Prediabetes    Recurrent UTI    Sleep apnea    no CPAP    Past Surgical History:  Procedure Laterality Date   BLADDER SUSPENSION N/A 04/24/2013    Procedure: TRANSVAGINAL TAPE (TVT) Exact PROCEDURE;  Surgeon: Robbi JONELLE Render, MD;  Location: [facility];  Service: Gynecology;  Laterality: N/A;   CARPAL TUNNEL RELEASE Bilateral 1997   CATARACT EXTRACTION, BILATERAL     COLONOSCOPY     CYSTOSCOPY N/A 04/24/2013   Procedure: CYSTOSCOPY;  Surgeon: Robbi JONELLE Render, MD;  Location: [facility];  Service: Gynecology;  Laterality: N/A;   CYSTOSCOPY WITH RETROGRADE PYELOGRAM, URETEROSCOPY AND STENT PLACEMENT Left 05/22/2022   Procedure: CYSTOSCOPY WITH RETROGRADE PYELOGRAM, URETEROSCOPY AND STENT PLACEMENT;  Surgeon: Elisabeth Valli BIRCH, MD;  Location: [facility];  Service: Urology;  Laterality: Left;   CYSTOSCOPY WITH RETROGRADE PYELOGRAM, URETEROSCOPY AND STENT PLACEMENT Left 06/05/2022   Procedure: CYSTOSCOPY WITH RETROGRADE PYELOGRAM, URETEROSCOPY AND STENT EXCHANGE;  Surgeon: Elisabeth Valli BIRCH, MD;  Location: [facility];  Service: Urology;  Laterality: Left;  1 HR   GIVENS CAPSULE STUDY N/A 10/21/2017   Procedure: GIVENS CAPSULE STUDY;  Surgeon: Legrand Victory LITTIE DOUGLAS, MD;  Location: [facility]COPY;  Service: Gastroenterology;  Laterality: N/A;   HOLMIUM LASER APPLICATION Left 05/22/2022   Procedure: MOSES LASER APPLICATION;  Surgeon: Elisabeth,  Valli BIRCH, MD;  Location: [facility];  Service: Urology;  Laterality: Left;   HOLMIUM LASER APPLICATION Left 06/05/2022   Procedure: HOLMIUM LASER APPLICATION;  Surgeon: Elisabeth Valli BIRCH, MD;  Location: [facility];  Service: Urology;  Laterality: Left;   LAPAROSCOPY Bilateral 05/03/2014   Procedure: LAPAROSCOPIC BILATERAL SALPINGO OOPHORECTOMY;  Surgeon: Ronal Elvie Pinal, MD;  Location: [facility];  Service: Gynecology;  Laterality: Bilateral;   MOUTH SURGERY     TOTAL KNEE ARTHROPLASTY Bilateral 04/2010    Current Medications: No outpatient medications have been marked as taking for the 06/07/23 encounter (Office Visit) with Zyrus Hetland, Aleene PARAS, MD.     Allergies:   Trintellix [vortioxetine]   Social History   Socioeconomic History    Marital status: Married    Spouse name: Not on file   Number of children: 2   Years of education: Not on file   Highest education level: Not on file  Occupational History   Not on file  Tobacco Use   Smoking status: Former    Current packs/day: 0.00    Types: Cigarettes    Quit date: 10/28/2016    Years since quitting: 6.6   Smokeless tobacco: Never   Tobacco comments:    1/2 PPD  Vaping Use   Vaping status: Never Used  Substance and Sexual Activity   Alcohol use: Not Currently   Drug use: No   Sexual activity: Not Currently    Partners: Male    Birth control/protection: Abstinence  Other Topics Concern   Not on file  Social History Narrative   Not on file   Social Drivers of Health   Financial Resource Strain: Low Risk  (09/04/2021)   Overall Financial Resource Strain (CARDIA)    Difficulty of Paying Living Expenses: Not hard at all  Food Insecurity: No Food Insecurity (09/04/2021)   Hunger Vital Sign    Worried About Running Out of Food in the Last Year: Never true    Ran Out of Food in the Last Year: Never true  Transportation Needs: No Transportation Needs (09/04/2021)   PRAPARE - Administrator, Civil Service (Medical): No    Lack of Transportation (Non-Medical): No  Physical Activity: Sufficiently Active (09/04/2021)   Exercise Vital Sign    Days of Exercise per Week: 7 days    Minutes of Exercise per Session: 40 min  Stress: No Stress Concern Present (09/04/2021)   [employer] of Occupational Health - Occupational Stress Questionnaire    Feeling of Stress : Not at all  Social Connections: Socially Integrated (09/04/2021)   Social Connection and Isolation Panel [NHANES]    Frequency of Communication with Friends and Family: More than three times a week    Frequency of Social Gatherings with Friends and Family: More than three times a week    Attends Religious Services: More than 4 times per year    Active Member of [employer] or Organizations: Yes    Attends  Engineer, Structural: More than 4 times per year    Marital Status: Married     Family History: The patient's family history includes Alcohol abuse in her father and maternal grandfather; COPD in her father; Cancer in her maternal grandmother; Depression in her maternal grandfather; Early death in her mother; Heart attack in her father; Heart disease in her paternal grandmother; Multiple myeloma in her mother. There is no history of Colon cancer, Stomach cancer, Esophageal cancer, or Rectal cancer.  ROS:   Please see the  history of present illness.     All other systems reviewed and are negative.  EKGs/Labs/Other Studies Reviewed:    The following studies were reviewed today:   EKG:    EKG Interpretation Date/Time:  Friday June 07 2023 14:22:32 EST Ventricular Rate:  103 PR Interval:  180 QRS Duration:  84 QT Interval:  344 QTC Calculation: 450 R Axis:   191  Text Interpretation: Sinus tachycardia Right superior axis deviation Low voltage QRS When compared with ECG of 21-Feb-2022 04:04, HR is faster since previous ECG Confirmed by Alveta Mungo ([id number]) on 06/07/2023 2:47:31 PM     Recent Labs: 06/04/2023: ALT 62; BUN 14; Creatinine, Ser 1.02; Hemoglobin 15.5; Platelets 180; Potassium 3.8; Sodium 141; TSH 4.250  Recent Lipid Panel    Component Value Date/Time   CHOL 142 02/28/2022 0818   TRIG 79 02/28/2022 0818   HDL 68 02/28/2022 0818   CHOLHDL 2.1 02/28/2022 0818   CHOLHDL 3 09/07/2019 1417   VLDL 25.0 09/07/2019 1417   LDLCALC 59 02/28/2022 0818     Risk Assessment/Calculations:           Physical Exam:     Physical Exam: Blood pressure 100/74, pulse (!) 107, height 5' 7 (1.702 m), weight 200 lb 3.2 oz (90.8 kg), last menstrual period 01/28/2010, SpO2 94%.       GEN:  Well nourished, well developed in no acute distress HEENT: Normal NECK: No JVD; No carotid bruits LYMPHATICS: No lymphadenopathy CARDIAC: RRR , no murmurs, rubs,  gallops RESPIRATORY:  Clear to auscultation without rales, wheezing or rhonchi  ABDOMEN: Soft, non-tender, non-distended MUSCULOSKELETAL:  No edema; No deformity  SKIN: Warm and dry NEUROLOGIC:  Alert and oriented x 3    ASSESSMENT:    1. Chest pain of uncertain etiology   2. Hyperlipidemia, unspecified hyperlipidemia type     PLAN:      1.  Carotid calcification:    2.  Chest pain: She had an episode of chest pain last week which brought her to the ER.  She has very minimal elevation of her LDL.  Coronary calcium  score 0.  She was found to have very mild plaque in her proximal LAD.    Last LDL was at goal.  Will continue the current medications.  Will check lipids today.  .  3.  Shortness of breath with exertion.   I encouraged her to get out and walk more. .            Medication Adjustments/Labs and Tests Ordered: Current medicines are reviewed at length with the patient today.  Concerns regarding medicines are outlined above.  Orders Placed This Encounter  Procedures   Lipid panel   EKG 12-Lead   Meds ordered this encounter  Medications   rosuvastatin  (CRESTOR ) 20 MG tablet    Sig: Take 1 tablet (20 mg total) by mouth daily. Must keep upcoming appt for additional refills    Dispense:  30 tablet    Refill:  0      Patient Instructions  Medication Instructions:  Refilled Rosuvastatin  (Crestor ) 20 mg once daily *If you need a refill on your cardiac medications before your next appointment, please call your pharmacy*   Lab Work: Lipids If you have labs (blood work) drawn today and your tests are completely normal, you will receive your results only by: MyChart Message (if you have MyChart) OR A paper copy in the mail If you have any lab test that is abnormal or we need  to change your treatment, we will call you to review the results.   Follow-Up: At [facility], you and your health needs are our priority.  As part of our continuing  mission to provide you with exceptional heart care, we have created designated Provider Care Teams.  These Care Teams include your primary Cardiologist (physician) and Advanced Practice Providers (APPs -  Physician Assistants and Nurse Practitioners) who all work together to provide you with the care you need, when you need it.  We recommend signing up for the patient portal called MyChart.  Sign up information is provided on this After Visit Summary.  MyChart is used to connect with patients for Virtual Visits (Telemedicine).  Patients are able to view lab/test results, encounter notes, upcoming appointments, etc.  Non-urgent messages can be sent to your provider as well.   To learn more about what you can do with MyChart, go to [email or web address].    Your next appointment:   1 year(s)  Provider:   Verlin, MD  Other Instructions   1st Floor: - Lobby - Registration  - Pharmacy  - Lab - Cafe  2nd Floor: - PV Lab - Diagnostic Testing (echo, CT, nuclear med)  3rd Floor: - Vacant  4th Floor: - TCTS (cardiothoracic surgery) - AFib Clinic - Structural Heart Clinic - Vascular Surgery  - Vascular Ultrasound  5th Floor: - HeartCare Cardiology (general and EP) - Clinical Pharmacy for coumadin, hypertension, lipid, weight-loss medications, and med management appointments    Valet parking services will be available as well.          Signed, Aleene Passe, MD  06/07/2023 2:48 PM    [facility]

## 2023-06-07 ENCOUNTER — Ambulatory Visit: Payer: Medicare Other | Attending: Cardiovascular Disease | Admitting: Cardiovascular Disease

## 2023-06-07 VITALS — BP 100/74 | HR 107 | Ht 67.0 in | Wt 200.2 lb

## 2023-06-07 DIAGNOSIS — R079 Chest pain, unspecified: Secondary | ICD-10-CM | POA: Diagnosis present

## 2023-06-07 DIAGNOSIS — E785 Hyperlipidemia, unspecified: Secondary | ICD-10-CM | POA: Insufficient documentation

## 2023-06-07 MED ORDER — ROSUVASTATIN CALCIUM 20 MG PO TABS: 20.0000 mg | ORAL_TABLET | Freq: Every day | ORAL | 0 refills | Status: DC

## 2023-06-07 NOTE — Patient Instructions (Signed)
 Medication Instructions:  Refilled Rosuvastatin  (Crestor ) 20 mg once daily *If you need a refill on your cardiac medications before your next appointment, please call your pharmacy*   Lab Work: Lipids If you have labs (blood work) drawn today and your tests are completely normal, you will receive your results only by: MyChart Message (if you have MyChart) OR A paper copy in the mail If you have any lab test that is abnormal or we need to change your treatment, we will call you to review the results.   Follow-Up: At [facility], you and your health needs are our priority.  As part of our continuing mission to provide you with exceptional heart care, we have created designated Provider Care Teams.  These Care Teams include your primary Cardiologist (physician) and Advanced Practice Providers (APPs -  Physician Assistants and Nurse Practitioners) who all work together to provide you with the care you need, when you need it.  We recommend signing up for the patient portal called MyChart.  Sign up information is provided on this After Visit Summary.  MyChart is used to connect with patients for Virtual Visits (Telemedicine).  Patients are able to view lab/test results, encounter notes, upcoming appointments, etc.  Non-urgent messages can be sent to your provider as well.   To learn more about what you can do with MyChart, go to [email or web address].    Your next appointment:   1 year(s)  Provider:   Verlin, MD  Other Instructions   1st Floor: - Lobby - Registration  - Pharmacy  - Lab - Cafe  2nd Floor: - PV Lab - Diagnostic Testing (echo, CT, nuclear med)  3rd Floor: - Vacant  4th Floor: - TCTS (cardiothoracic surgery) - AFib Clinic - Structural Heart Clinic - Vascular Surgery  - Vascular Ultrasound  5th Floor: - HeartCare Cardiology (general and EP) - Clinical Pharmacy for coumadin, hypertension, lipid, weight-loss medications, and med management  appointments    Valet parking services will be available as well.

## 2023-06-08 ENCOUNTER — Encounter: Payer: Self-pay | Admitting: Neurology

## 2023-06-08 LAB — LIPID PANEL
Chol/HDL Ratio: 2 {ratio} (ref 0.0–4.4)
Cholesterol, Total: 176 mg/dL (ref 100–199)
HDL: 86 mg/dL (ref >39)
LDL Chol Calc (NIH): 73 mg/dL (ref 0–99)
Triglycerides: 97 mg/dL (ref 0–149)
VLDL Cholesterol Cal: 17 mg/dL (ref 5–40)

## 2023-06-09 ENCOUNTER — Encounter: Payer: Self-pay | Admitting: Cardiovascular Disease

## 2023-07-05 ENCOUNTER — Ambulatory Visit
Admission: RE | Admit: 2023-07-05 | Discharge: 2023-07-05 | Disposition: A | Discharge status: Home / Self Care | Payer: No Typology Code available for payment source | Source: Ambulatory Visit | Attending: Neurology | Admitting: Neurology

## 2023-07-05 DIAGNOSIS — R4189 Other symptoms and signs involving cognitive functions and awareness: Secondary | ICD-10-CM | POA: Diagnosis not present

## 2023-07-05 DIAGNOSIS — F32A Depression, unspecified: Secondary | ICD-10-CM

## 2023-07-08 ENCOUNTER — Telehealth: Payer: Self-pay | Admitting: Neurology

## 2023-07-08 NOTE — Telephone Encounter (Signed)
 Please call patient MRI of the brain showed moderate small vessel disease, generalized atrophy there was no acute abnormality  May talk further evaluation at next follow-up visit   IMPRESSION: MRI scan of the brain without contrast and slightly age advanced changes of chronic small vessel disease and mild degree of generalized cerebral atrophy.  No acute abnormality.

## 2023-07-08 NOTE — Telephone Encounter (Signed)
 Pt's husband called and LVM wanting to speak to the nurse again since the phone call was disconnect during the conversation. Please advise.

## 2023-07-08 NOTE — Telephone Encounter (Signed)
 Called and relayed result to husband and that they May talk further evaluation at next follow-up visit

## 2023-07-09 ENCOUNTER — Encounter: Payer: Self-pay | Admitting: Neurology

## 2023-07-09 NOTE — Telephone Encounter (Signed)
 Documented in mychart per husbands request

## 2023-08-08 ENCOUNTER — Other Ambulatory Visit: Payer: Self-pay | Admitting: Cardiovascular Disease

## 2023-09-07 ENCOUNTER — Emergency Department (HOSPITAL_BASED_OUTPATIENT_CLINIC_OR_DEPARTMENT_OTHER)
Admission: EM | Admit: 2023-09-07 | Discharge: 2023-09-07 | Disposition: A | Discharge status: Home / Self Care | Attending: Emergency Medicine | Admitting: Emergency Medicine

## 2023-09-07 ENCOUNTER — Encounter (HOSPITAL_BASED_OUTPATIENT_CLINIC_OR_DEPARTMENT_OTHER): Payer: Self-pay | Admitting: *Deleted

## 2023-09-07 ENCOUNTER — Emergency Department (HOSPITAL_BASED_OUTPATIENT_CLINIC_OR_DEPARTMENT_OTHER)

## 2023-09-07 ENCOUNTER — Other Ambulatory Visit: Payer: Self-pay

## 2023-09-07 DIAGNOSIS — N200 Calculus of kidney: Secondary | ICD-10-CM | POA: Diagnosis not present

## 2023-09-07 DIAGNOSIS — N12 Tubulo-interstitial nephritis, not specified as acute or chronic: Secondary | ICD-10-CM | POA: Diagnosis not present

## 2023-09-07 DIAGNOSIS — N3080 Other cystitis without hematuria: Secondary | ICD-10-CM | POA: Insufficient documentation

## 2023-09-07 DIAGNOSIS — R109 Unspecified abdominal pain: Secondary | ICD-10-CM | POA: Diagnosis present

## 2023-09-07 LAB — COMPREHENSIVE METABOLIC PANEL WITH GFR
ALT: 36 U/L (ref 0–44)
AST: 29 U/L (ref 15–41)
Albumin: 4.2 g/dL (ref 3.5–5.0)
Alkaline Phosphatase: 95 U/L (ref 38–126)
Anion gap: 13 (ref 5–15)
BUN: 14 mg/dL (ref 8–23)
CO2: 24 mmol/L (ref 22–32)
Calcium: 9.6 mg/dL (ref 8.9–10.3)
Chloride: 103 mmol/L (ref 98–111)
Creatinine, Ser: 1.06 mg/dL — ABNORMAL HIGH (ref 0.44–1.00)
GFR, Estimated: 57 mL/min — ABNORMAL LOW (ref >60)
Glucose, Bld: 131 mg/dL — ABNORMAL HIGH (ref 70–99)
Potassium: 3.6 mmol/L (ref 3.5–5.1)
Sodium: 140 mmol/L (ref 135–145)
Total Bilirubin: 0.8 mg/dL (ref 0.0–1.2)
Total Protein: 6.3 g/dL — ABNORMAL LOW (ref 6.5–8.1)

## 2023-09-07 LAB — URINALYSIS, W/ REFLEX TO CULTURE (INFECTION SUSPECTED)
Bilirubin Urine: NEGATIVE
Glucose, UA: NEGATIVE mg/dL
Ketones, ur: NEGATIVE mg/dL
Leukocytes,Ua: NEGATIVE
Nitrite: NEGATIVE
Protein, ur: NEGATIVE mg/dL
Specific Gravity, Urine: 1.005 (ref 1.005–1.030)
pH: 6.5 (ref 5.0–8.0)

## 2023-09-07 LAB — CBC WITH DIFFERENTIAL/PLATELET
Abs Immature Granulocytes: 0.02 10*3/uL (ref 0.00–0.07)
Basophils Absolute: 0 10*3/uL (ref 0.0–0.1)
Basophils Relative: 0 %
Eosinophils Absolute: 0 10*3/uL (ref 0.0–0.5)
Eosinophils Relative: 0 %
HCT: 46.7 % — ABNORMAL HIGH (ref 36.0–46.0)
Hemoglobin: 15.5 g/dL — ABNORMAL HIGH (ref 12.0–15.0)
Immature Granulocytes: 0 %
Lymphocytes Relative: 3 %
Lymphs Abs: 0.2 10*3/uL — ABNORMAL LOW (ref 0.7–4.0)
MCH: 31.6 pg (ref 26.0–34.0)
MCHC: 33.2 g/dL (ref 30.0–36.0)
MCV: 95.1 fL (ref 80.0–100.0)
Monocytes Absolute: 0 10*3/uL — ABNORMAL LOW (ref 0.1–1.0)
Monocytes Relative: 0 %
Neutro Abs: 6.4 10*3/uL (ref 1.7–7.7)
Neutrophils Relative %: 97 %
Platelets: 161 10*3/uL (ref 150–400)
RBC: 4.91 MIL/uL (ref 3.87–5.11)
RDW: 12.4 % (ref 11.5–15.5)
WBC: 6.8 10*3/uL (ref 4.0–10.5)
nRBC: 0 % (ref 0.0–0.2)

## 2023-09-07 LAB — LIPASE, BLOOD: Lipase: 43 U/L (ref 11–51)

## 2023-09-07 MED ORDER — ONDANSETRON HCL 4 MG/2ML IJ SOLN
4.0000 mg | Freq: Once | INTRAMUSCULAR | Status: AC
  Administered 2023-09-07: 4 mg via INTRAVENOUS
  Filled 2023-09-07: qty 2

## 2023-09-07 MED ORDER — SODIUM CHLORIDE 0.9 % IV BOLUS
1000.0000 mL | Freq: Once | INTRAVENOUS | Status: AC
  Administered 2023-09-07: 1000 mL via INTRAVENOUS

## 2023-09-07 MED ORDER — CEPHALEXIN 500 MG PO CAPS: 500.0000 mg | ORAL_CAPSULE | Freq: Three times a day (TID) | ORAL | 0 refills | Status: AC

## 2023-09-07 MED ORDER — MORPHINE SULFATE (PF) 4 MG/ML IV SOLN
4.0000 mg | Freq: Once | INTRAVENOUS | Status: AC
  Administered 2023-09-07: 4 mg via INTRAVENOUS
  Filled 2023-09-07: qty 1

## 2023-09-07 MED ORDER — SODIUM CHLORIDE 0.9 % IV SOLN
1.0000 g | Freq: Once | INTRAVENOUS | Status: AC
  Administered 2023-09-07: 1 g via INTRAVENOUS
  Filled 2023-09-07: qty 10

## 2023-09-07 MED ORDER — IOHEXOL 300 MG/ML  SOLN
100.0000 mL | Freq: Once | INTRAMUSCULAR | Status: AC | PRN
  Administered 2023-09-07: 100 mL via INTRAVENOUS

## 2023-09-07 NOTE — ED Provider Notes (Signed)
  Physical Exam  BP 119/60 (BP Location: Right Arm)   Pulse 85   Temp 97.6 F (36.4 C) (Oral)   Resp 18   Ht 5\' 7"  (1.702 m)   Wt 88.5 kg   LMP 01/28/2010   SpO2 91%   BMI 30.54 kg/m   Physical Exam  Procedures  Procedures  ED Course / MDM   Clinical Course as of 09/07/23 1619  Sat Sep 07, 2023  1347 Labs approximately at baseline. CT and UA pending. [VK]  1425 Bacteruria on UA with few squams. CT read is pending. [VK]  1501 Patient signed out to Dr. Delana Favors pending CT read. [VK]    Clinical Course User Index [VK] Kingsley, Victoria K, DO   Medical Decision Making Care assumed at 3 PM.  Patient is here with abdominal pain.  Patient has a history of kidney stones and also recurrent cystitis.  Patient has no fever or vomiting.  Signed out pending CT abdomen pelvis results  4:23 PM White blood cell count is normal.  UA showed bacteria.  CT abdomen pelvis showed clustered stones in the left pelvis and no hydronephrosis.  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  Patient does not appear septic.  I discussed case with Dr. Estanislao Heimlich from urology.  He agree with antibiotics and will arrange follow-up next week   Problems Addressed: Emphysematous cystitis: acute illness or injury Kidney stone: acute illness or injury Pyelonephritis: acute illness or injury  Amount and/or Complexity of Data Reviewed Labs: ordered. Decision-making details documented in ED Course. Radiology: ordered.  Risk Prescription drug management.          Dalene Duck, MD 09/07/23 [phone]

## 2023-09-07 NOTE — ED Notes (Signed)
 Pt CA&O to baseline. D/C instructions discussed with patient and all questions answered. All belongings with patient. Pt ambulatory on discharge.

## 2023-09-07 NOTE — ED Provider Notes (Signed)
 [place] EMERGENCY DEPARTMENT AT [facility] Provider Note   CSN: [id number] Arrival date & time: 09/07/23  1201     History  Chief Complaint  Patient presents with   Abdominal Pain    Jasmine [name] is a 68 y.o. female.  Patient is a 68 year old female with a past medical history of hyperlipidemia and cognitive decline presenting to the emergency department with abdominal pain.  Patient states that she started to develop some left-sided abdominal pain last night and has been constant since it started.  She reports some nausea but denies any vomiting.  Denies any fevers or chills, diarrhea or constipation, dysuria or hematuria, black or bloody stools.  She states that she has had diverticulitis in the past and this does feel similar.  She denies any prior abdominal surgeries.  The history is provided by the patient and the spouse.  Abdominal Pain      Home Medications Prior to Admission medications   Medication Sig Start Date End Date Taking? Authorizing Provider  acetaminophen  (TYLENOL ) 500 MG tablet Take 500 mg by mouth every 6 (six) hours as needed (for pain.).    [provider]  ALPRAZolam (XANAX) 0.5 MG tablet Take 0.5-1 mg by mouth See admin instructions. 0.5 mg during the day as needed, and taking 1 mg at bedtime every night 01/08/17   [provider]  ARIPiprazole (ABILIFY) 2 MG tablet Take 2 mg by mouth daily.    [provider]  ibuprofen  (ADVIL ) 200 MG tablet Take 600 mg by mouth every 6 (six) hours as needed (pain.).    [provider]  memantine (NAMENDA XR) 28 MG CP24 24 hr capsule Take 28 mg by mouth every morning. 05/14/23   [provider]  Methenamine-Sodium Salicylate (AZO URINARY TRACT DEFENSE PO) Take 1 tablet by mouth daily. With D-Mannose    [provider]  rosuvastatin  (CRESTOR ) 20 MG tablet Take 1 tablet (20 mg total) by mouth daily. 08/08/23   Nahser, Lela Purple, MD  venlafaxine XR  (EFFEXOR-XR) 75 MG 24 hr capsule Take 225 mg by mouth at bedtime.    [provider]      Allergies    Trintellix [vortioxetine]    Review of Systems   Review of Systems  Gastrointestinal:  Positive for abdominal pain.    Physical Exam Updated Vital Signs BP 138/60 (BP Location: Right Arm)   Pulse (!) 110   Temp 97.6 F (36.4 C) (Oral)   Resp 16   Ht 5\' 7"  (1.702 m)   Wt 88.5 kg   LMP 01/28/2010   SpO2 100%   BMI 30.54 kg/m  Physical Exam Vitals and nursing note reviewed.  Constitutional:      General: She is not in acute distress.    Appearance: She is well-developed.  HENT:     Head: Normocephalic.     Mouth/Throat:     Mouth: Mucous membranes are moist.  Eyes:     Extraocular Movements: Extraocular movements intact.  Cardiovascular:     Rate and Rhythm: Normal rate and regular rhythm.     Heart sounds: Normal heart sounds.  Pulmonary:     Effort: Pulmonary effort is normal.     Breath sounds: Normal breath sounds.  Abdominal:     General: Abdomen is flat.     Palpations: Abdomen is soft.     Tenderness: There is abdominal tenderness in the left lower quadrant. There is no right CVA tenderness, left CVA tenderness, guarding  or rebound.  Skin:    General: Skin is warm and dry.  Neurological:     General: No focal deficit present.     Mental Status: She is alert and oriented to person, place, and time.  Psychiatric:        Mood and Affect: Mood normal.        Behavior: Behavior normal.     ED Results / Procedures / Treatments   Labs (all labs ordered are listed, but only abnormal results are displayed) Labs Reviewed  COMPREHENSIVE METABOLIC PANEL WITH GFR - Abnormal; Notable for the following components:      Result Value   Glucose, Bld 131 (*)    Creatinine, Ser 1.06 (*)    Total Protein 6.3 (*)    GFR, Estimated 57 (*)    All other components within normal limits  CBC WITH DIFFERENTIAL/PLATELET - Abnormal; Notable for the following  components:   Hemoglobin 15.5 (*)    HCT 46.7 (*)    Lymphs Abs 0.2 (*)    Monocytes Absolute 0.0 (*)    All other components within normal limits  URINALYSIS, W/ REFLEX TO CULTURE (INFECTION SUSPECTED) - Abnormal; Notable for the following components:   Color, Urine COLORLESS (*)    Hgb urine dipstick TRACE (*)    Bacteria, UA MANY (*)    All other components within normal limits  LIPASE, BLOOD    EKG None  Radiology No results found.  Procedures Procedures    Medications Ordered in ED Medications  sodium chloride  0.9 % bolus 1,000 mL (0 mLs Intravenous Stopped 09/07/23 1329)  ondansetron  (ZOFRAN ) injection 4 mg (4 mg Intravenous Given 09/07/23 1225)  morphine  (PF) 4 MG/ML injection 4 mg (4 mg Intravenous Given 09/07/23 1225)  iohexol  (OMNIPAQUE ) 300 MG/ML solution 100 mL (100 mLs Intravenous Contrast Given 09/07/23 1353)    ED Course/ Medical Decision Making/ A&P Clinical Course as of 09/07/23 1501  Sat Sep 07, 2023  1347 Labs approximately at baseline. CT and UA pending. [VK]  1425 Bacteruria on UA with few squams. CT read is pending. [VK]  1501 Patient signed out to Dr. Delana Favors pending CT read. [VK]    Clinical Course User Index [VK] Kingsley, Zareth Rippetoe K, DO                                 Medical Decision Making This patient presents to the ED with chief complaint(s) of abdominal pain with pertinent past medical history of HLD, cognitive decline, diverticulosis which further complicates the presenting complaint. The complaint involves an extensive differential diagnosis and also carries with it a high risk of complications and morbidity.    The differential diagnosis includes diverticulitis, pyelonephritis, nephrolithiasis, other intra-abdominal infection, gastritis, GERD, pancreatitis, dehydration, electrolyte abnormality  Additional history obtained: Additional history obtained from spouse Records reviewed outpatient cardiology records  ED Course and  Reassessment: On patient's arrival she was initially tachycardic in triage, heart rate normalized on my evaluation in the room otherwise stable in no acute distress.  She does have some left-sided abdominal tenderness on exam and will have labs and CT imaging to evaluate for intra-abdominal infection.  She will be given pain, nausea control and fluids and will be closely reassessed.  Independent labs interpretation:  The following labs were independently interpreted: bacteruria, otherwise within normal range  Independent visualization of imaging: - Pending   Amount and/or Complexity of Data Reviewed Labs: ordered. Radiology:  ordered.  Risk Prescription drug management.          Final Clinical Impression(s) / ED Diagnoses Final diagnoses:  None    Rx / DC Orders ED Discharge Orders     None         Kingsley, Jaidev Sanger K, DO 09/07/23 1501

## 2023-09-07 NOTE — ED Triage Notes (Addendum)
 Pt to ED reporting sharp left upper abd pain starting last night. Pain reportedly feels like diverticulitis flare ups in the past. No NVD, no fevers.

## 2023-09-07 NOTE — Discharge Instructions (Addendum)
 As we discussed, you likely have a kidney infection from bladder infection  You also have some small kidney stones  I recommend you take Keflex  3 times daily for 2 weeks  I have contacted the urologist on call, Dr. Estanislao Heimlich.  His office should contact you on Monday for appointment next week.  If they do not contact you please call the office on Tuesday for appointment  Return to ER if you have fever or vomiting or severe pain

## 2023-09-09 ENCOUNTER — Ambulatory Visit (INDEPENDENT_AMBULATORY_CARE_PROVIDER_SITE_OTHER): Payer: Medicare Other | Admitting: Neurology

## 2023-09-09 ENCOUNTER — Encounter: Payer: Self-pay | Admitting: Neurology

## 2023-09-09 VITALS — BP 114/81 | HR 94 | Ht 67.0 in | Wt 209.5 lb

## 2023-09-09 DIAGNOSIS — F32A Depression, unspecified: Secondary | ICD-10-CM | POA: Diagnosis not present

## 2023-09-09 DIAGNOSIS — R4189 Other symptoms and signs involving cognitive functions and awareness: Secondary | ICD-10-CM | POA: Diagnosis not present

## 2023-09-09 DIAGNOSIS — I679 Cerebrovascular disease, unspecified: Secondary | ICD-10-CM | POA: Diagnosis not present

## 2023-09-09 NOTE — Patient Instructions (Addendum)
 Donanemab (KisunlaT) Lecanemab Sharlie Days)   [email or web address]

## 2023-09-09 NOTE — Progress Notes (Signed)
 Chief Complaint  Patient presents with   Memory Loss    Rm12, husband present, Memory loss: moca score was 24 LAST TIME 18       ASSESSMENT AND PLAN  Jasmine [name] is a 68 y.o. female   Dementia History of severe depression anxiety, had ECT treatment about 20 years ago  Neuropsychology evaluation by Dr.Jenna Renfore showed cognitive impairment suggestive of Alzheimer's disease,  MoCA examination 24/30  MRI of the brain showed mild atrophy, mild to moderate cerebral vascular disease.  Laboratory evaluation showed low B12 226, on supplement, otherwise no treatable etiology,  We discussed, decided to proceed with further evaluation for Alzheimer's pathology such as amyloid PET scan, ATN profile, APOE genetic variant,   Encouraged her moderate exercise,  Introduce Alzheimer's Association website, amyloid removing treatment options,  Return To Clinic With NP In 6 Months   DIAGNOSTIC DATA (LABS, IMAGING, TESTING) - I reviewed patient records, labs, notes, testing and imaging myself where available.   MEDICAL HISTORY:  Jasmine [name] is a 68 year old female, seen in request by her psychiatrist Dr. Levie Ream, Doroteo Gasmen for evaluation of cognitive malfunction, her primary care physician is Dr. Annabell Key, Jerilynn Montenegro, initial evaluation was on February 4th 2025  History is obtained from the patient and review of electronic medical records. I personally reviewed pertinent available imaging films in PACS.   PMHx of  HLD Depression, on effexor 225mg  since 2024, abilify since 2023,   She has long history of depression anxiety, even received ECT treatment 20 years ago, but over the years, her mood disorder is under excellent control with medications, she retired from her own business of estate sale around age 79  Since 2022, she was noted to have gradual change, loss of interest, no longer social with her friends, difficult to carry on a conversation, was treated by Dr. Levie Ream over past one  year, tried different medications, eventually settled on current Effexor, and Abilify around 2023, there was some improvement of her mood, she began to go out, interact with people some  But she is far from her baseline, not like to initiate conversation, lost train of the thought in the middle of the conversation, sedentary, watch TV most of the time, her older sister at age 34  suffered severe Alzheimer's disease, required nursing home  She already had neuropsychology evaluation by Dr. 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,  UPDATE Sep 09 2023: She is accompanied by her husband at today's clinical visit, overall doing well, depression is under good control, MoCA examination improved from 19 to 24/30 today, personally reviewed MRI of the brain, mild generalized atrophy, mild to moderate small vessel disease  Extensive laboratory evaluation, low normal B12 226, on supplement, positive ANA, otherwise no treatable etiology,  She has strong family history of Alzheimer's disease, older sister at age 77, was diagnosed of Alzheimer's in her 69s, now at nursing home, need total care,   We discussed the need for more extensive evaluation to further confirm the possible diagnosis of Alzheimer's, decided to proceed with PET scan, APO E genetic variant, and Alzheimer's profile,  We also  spent time discussed about current  amyloid removing treatment, potential side effect    PHYSICAL EXAM:   Vitals:   09/09/23 0855  BP: 114/81  Pulse: 94  Weight: 209 lb 8 oz (95 kg)  Height: 5\' 7"  (1.702 m)   Body mass index is 32.81 kg/m.  PHYSICAL EXAMNIATION:  Gen: NAD, conversant, well nourised, well groomed                     Cardiovascular: Regular rate rhythm, no peripheral edema, warm, nontender. Eyes: Conjunctivae clear without exudates or hemorrhage Neck: Supple, no carotid bruits. Pulmonary: Clear to auscultation bilaterally   NEUROLOGICAL EXAM:  MENTAL STATUS: Speech/cognition: Rely on her husband to provide history, cooperative on examination    09/09/2023    8:57 AM 06/04/2023    8:25 AM  Montreal Cognitive Assessment   Visuospatial/ Executive (0/5) 5 5  Naming (0/3) 3 3  Attention: Read list of digits (0/2) 2 2  Attention: Read list of letters (0/1) 1 1  Attention: Serial 7 subtraction starting at 100 (0/3) 1 1  Language: Repeat phrase (0/2) 2 1  Language : Fluency (0/1) 0 1  Abstraction (0/2) 2 2  Delayed Recall (0/5) 2 0  Orientation (0/6) 6 3  Total 24 19    CRANIAL NERVES: CN II: Visual fields are full to confrontation. Pupils are round equal and briskly reactive to light. CN III, IV, VI: extraocular movement are normal. No ptosis. CN V: Facial sensation is intact to light touch CN VII: Face is symmetric with normal eye closure  CN VIII: Hearing is normal to causal conversation. CN IX, X: Phonation is normal. CN XI: Head turning and shoulder shrug are intact  MOTOR: There is no pronator drift of out-stretched arms. Muscle bulk and tone are normal. Muscle strength is normal.  REFLEXES: Reflexes are 2+ and symmetric at the biceps, triceps, knees, and ankles. Plantar responses are flexor.  SENSORY: Intact to light touch, pinprick and vibratory sensation are intact in fingers and toes.  COORDINATION: There is no trunk or limb dysmetria noted.  GAIT/STANCE: Posture is normal. Gait is steady  REVIEW OF SYSTEMS:  Full 14  system review of systems performed and notable only for as above All other review of systems were negative.   ALLERGIES: Allergies  Allergen Reactions   Trintellix [Vortioxetine] Itching and Rash    HOME MEDICATIONS: Current Outpatient Medications  Medication Sig Dispense Refill   acetaminophen  (TYLENOL ) 500 MG tablet Take 500 mg by mouth every 6 (six) hours as needed (for pain.).     ALPRAZolam (XANAX) 0.5 MG tablet Take 0.5-1 mg by mouth See admin instructions. 0.5 mg during the day as needed, and taking 1 mg at bedtime every night     ARIPiprazole (ABILIFY) 2 MG tablet Take 2 mg by mouth daily.     cephALEXin  (KEFLEX ) 500 MG capsule Take 1 capsule (500 mg total) by mouth 3 (three) times daily for 14 days. 42 capsule 0   Cyanocobalamin (VITAMIN B 12 PO) Take 1,000 mg by mouth daily.     ibuprofen  (ADVIL ) 200 MG tablet Take 600 mg by mouth every 6 (six) hours as needed (pain.).     memantine (NAMENDA XR) 28 MG CP24 24 hr capsule Take 28 mg by mouth every morning.     Methenamine-Sodium Salicylate (AZO URINARY TRACT DEFENSE PO) Take 1 tablet by mouth daily. With D-Mannose     rosuvastatin  (CRESTOR ) 20 MG tablet Take 1 tablet (20  mg total) by mouth daily. 90 tablet 3   venlafaxine XR (EFFEXOR-XR) 75 MG 24 hr capsule Take 225 mg by mouth at bedtime.     No current facility-administered medications for this visit.    PAST MEDICAL HISTORY: Past Medical History:  Diagnosis Date   Anemia    Anginal pain (HCC)    Anxiety    Arthritis    Chest pain    Depression    Diverticulosis    GERD (gastroesophageal reflux disease)    tums only PRN   History of diverticulitis    History of kidney stones    Ovarian cyst    Prediabetes    Recurrent UTI    Sleep apnea    no CPAP    PAST SURGICAL HISTORY: Past Surgical History:  Procedure Laterality Date   BLADDER SUSPENSION N/A 04/24/2013   Procedure: TRANSVAGINAL TAPE (TVT) Exact PROCEDURE;  Surgeon: Shasta Deist, MD;  Location: [facility];  Service: Gynecology;  Laterality: N/A;   CARPAL TUNNEL RELEASE Bilateral 1997   CATARACT EXTRACTION, BILATERAL     COLONOSCOPY     CYSTOSCOPY N/A 04/24/2013   Procedure: CYSTOSCOPY;  Surgeon: Shasta Deist, MD;  Location: [facility];  Service: Gynecology;  Laterality: N/A;   CYSTOSCOPY WITH RETROGRADE PYELOGRAM, URETEROSCOPY AND STENT PLACEMENT Left 05/22/2022   Procedure: CYSTOSCOPY WITH RETROGRADE PYELOGRAM, URETEROSCOPY AND STENT PLACEMENT;  Surgeon: Roxane Copp, MD;  Location: [facility];  Service: Urology;  Laterality: Left;   CYSTOSCOPY WITH RETROGRADE PYELOGRAM, URETEROSCOPY AND STENT PLACEMENT Left 06/05/2022   Procedure: CYSTOSCOPY WITH RETROGRADE PYELOGRAM, URETEROSCOPY AND STENT EXCHANGE;  Surgeon: Roxane Copp, MD;  Location: [facility];  Service: Urology;  Laterality: Left;  1 HR   GIVENS CAPSULE STUDY N/A 10/21/2017   Procedure: GIVENS CAPSULE STUDY;  Surgeon: Albertina Hugger, MD;  Location: [facility] ENDOSCOPY;  Service: Gastroenterology;  Laterality: N/A;   HOLMIUM LASER APPLICATION Left 05/22/2022   Procedure: MOSES LASER APPLICATION;  Surgeon: Roxane Copp, MD;  Location: [facility];  Service: Urology;  Laterality: Left;   HOLMIUM LASER APPLICATION Left 06/05/2022   Procedure: HOLMIUM LASER APPLICATION;  Surgeon: Roxane Copp, MD;  Location: [facility];  Service: Urology;  Laterality: Left;   LAPAROSCOPY Bilateral 05/03/2014   Procedure: LAPAROSCOPIC BILATERAL SALPINGO OOPHORECTOMY;  Surgeon: Axel Bohr, MD;  Location: [facility];  Service: Gynecology;  Laterality: Bilateral;   MOUTH SURGERY     TOTAL KNEE ARTHROPLASTY Bilateral 04/2010    FAMILY HISTORY: Family History  Problem Relation Age of Onset   Multiple myeloma Mother    Early death Mother    Heart attack Father    Alcohol abuse Father    COPD Father    Cancer Maternal Grandmother    Alcohol abuse Maternal Grandfather    Depression Maternal Grandfather    Heart disease Paternal Grandmother    Colon  cancer Neg Hx    Stomach cancer Neg Hx    Esophageal cancer Neg Hx    Rectal cancer Neg Hx     SOCIAL HISTORY: Social History   Socioeconomic History   Marital status: Married    Spouse name: Not on file   Number of children: 2   Years of education: Not on file   Highest education level: Not on file  Occupational History   Not on file  Tobacco Use   Smoking status: Former    Current packs/day: 0.00    Types: Cigarettes    Quit date:  10/28/2016    Years since quitting: 6.8   Smokeless tobacco: Never   Tobacco comments:    1/2 PPD  Vaping Use   Vaping status: Never Used  Substance and Sexual Activity   Alcohol use: Not Currently   Drug use: No   Sexual activity: Not Currently    Partners: Male    Birth control/protection: Abstinence  Other Topics Concern   Not on file  Social History Narrative   Not on file   Social Drivers of Health   Financial Resource Strain: Low Risk  (09/04/2021)   Overall Financial Resource Strain (CARDIA)    Difficulty of Paying Living Expenses: Not hard at all  Food Insecurity: No Food Insecurity (09/04/2021)   Hunger Vital Sign    Worried About Running Out of Food in the Last Year: Never true    Ran Out of Food in the Last Year: Never true  Transportation Needs: No Transportation Needs (09/04/2021)   PRAPARE - Administrator, Civil Service (Medical): No    Lack of Transportation (Non-Medical): No  Physical Activity: Sufficiently Active (09/04/2021)   Exercise Vital Sign    Days of Exercise per Week: 7 days    Minutes of Exercise per Session: 40 min  Stress: No Stress Concern Present (09/04/2021)   [employer] of Occupational Health - Occupational Stress Questionnaire    Feeling of Stress : Not at all  Social Connections: Socially Integrated (09/04/2021)   Social Connection and Isolation Panel [NHANES]    Frequency of Communication with Friends and Family: More than three times a week    Frequency of Social Gatherings with  Friends and Family: More than three times a week    Attends Religious Services: More than 4 times per year    Active Member of [employer] or Organizations: Yes    Attends Banker Meetings: More than 4 times per year    Marital Status: Married  Catering manager Violence: Not At Risk (09/04/2021)   Humiliation, Afraid, Rape, and Kick questionnaire    Fear of Current or Ex-Partner: No    Emotionally Abused: No    Physically Abused: No    Sexually Abused: No      Phebe Brasil, M.D. Ph.D.  [facility] [address], [address] [place], [place] [address] Ph: [phone] Fax: [phone]  CC:  Lillian Rein, MD [address] [place],  [place] [address]  Lillian Rein, MD    Total time spent reviewing the chart, obtaining history, examined patient, ordering tests, documentation, consultations and family, care coordination was  40 minutes

## 2023-09-10 ENCOUNTER — Telehealth: Payer: Self-pay | Admitting: Neurology

## 2023-09-10 NOTE — Telephone Encounter (Signed)
 no auth required sent to [facility] [phone]

## 2023-09-11 LAB — URINE CULTURE: Culture: >=100000 — AB

## 2023-09-12 ENCOUNTER — Telehealth (HOSPITAL_BASED_OUTPATIENT_CLINIC_OR_DEPARTMENT_OTHER): Payer: Self-pay | Admitting: *Deleted

## 2023-09-12 NOTE — Telephone Encounter (Signed)
 Post ED Visit - Positive Culture Follow-up  Culture report reviewed by antimicrobial stewardship pharmacist: [facility] [x]  [place], [place], [place].D. []  Skeet Duke, Pharm.D., BCPS AQ-ID []  Leslee Rase, Pharm.D., BCPS []  Garland Junk, Pharm.D., BCPS []  [place], [place].D., BCPS, AAHIVP []  Alcide Aly, Pharm.D., BCPS, AAHIVP []  Jerri Morale, PharmD, BCPS []  Graham Laws, PharmD, BCPS []  Cleda Curly, PharmD, BCPS []  Tamar Fairly, PharmD []  Ballard Levels, PharmD, BCPS []  Ollen Beverage, PharmD  [facility] []  Arlyne Bering, PharmD []  Sherryle Don, PharmD []  [place], PharmD []  Delila Felty, Rph []  [place]) Cleora Daft, PharmD []  Augustina Block, PharmD []  Arie Kurtz, PharmD []  Sharlyn Deaner, PharmD []  Agnes Hose, PharmD []  Kendall Pauls, PharmD []  Gladstone Lamer, PharmD []  Armanda Bern, PharmD []  Tera Fellows, PharmD   Positive urine culture Treated with cephalexin , organism sensitive to the same and no further patient follow-up is required at this time.  Jasmine [name] 09/12/2023, 12:49 PM

## 2023-09-17 ENCOUNTER — Other Ambulatory Visit: Payer: Self-pay | Admitting: Urology

## 2023-09-19 LAB — APOE ALZHEIMER'S RISK

## 2023-09-19 LAB — ATN PROFILE
A -- Beta-amyloid 42/40 Ratio: 0.103 (ref >0.102)
Beta-amyloid 40: 211.93 pg/mL
Beta-amyloid 42: 21.83 pg/mL
N -- NfL, Plasma: 2.64 pg/mL (ref 0.00–3.65)
T -- p-tau181: 2.23 pg/mL — ABNORMAL HIGH (ref 0.00–0.97)

## 2023-09-24 ENCOUNTER — Ambulatory Visit: Payer: Self-pay | Admitting: Neurology

## 2023-09-25 ENCOUNTER — Encounter (HOSPITAL_COMMUNITY)
Admission: RE | Admit: 2023-09-25 | Discharge: 2023-09-25 | Disposition: A | Discharge status: Home / Self Care | Payer: PRIVATE HEALTH INSURANCE | Source: Ambulatory Visit | Attending: Neurology | Admitting: Neurology

## 2023-09-25 DIAGNOSIS — I680 Cerebral amyloid angiopathy: Secondary | ICD-10-CM | POA: Diagnosis not present

## 2023-09-25 DIAGNOSIS — E854 Organ-limited amyloidosis: Secondary | ICD-10-CM | POA: Diagnosis not present

## 2023-09-25 DIAGNOSIS — R4189 Other symptoms and signs involving cognitive functions and awareness: Secondary | ICD-10-CM | POA: Insufficient documentation

## 2023-09-25 DIAGNOSIS — F32A Depression, unspecified: Secondary | ICD-10-CM | POA: Diagnosis present

## 2023-09-25 DIAGNOSIS — I679 Cerebrovascular disease, unspecified: Secondary | ICD-10-CM | POA: Insufficient documentation

## 2023-09-25 DIAGNOSIS — R413 Other amnesia: Secondary | ICD-10-CM | POA: Diagnosis not present

## 2023-09-25 MED ORDER — FLORBETAPIR F 18 500-1900 MBQ/ML IV SOLN
9.9000 | Freq: Once | INTRAVENOUS | Status: AC
  Administered 2023-09-25: 9.9 via INTRAVENOUS

## 2023-09-27 ENCOUNTER — Inpatient Hospital Stay (HOSPITAL_COMMUNITY): Admitting: Anesthesiology

## 2023-09-27 ENCOUNTER — Ambulatory Visit (HOSPITAL_COMMUNITY)
Admission: AD | Admit: 2023-09-27 | Discharge: 2023-09-27 | Disposition: A | Discharge status: Home / Self Care | Source: Ambulatory Visit | Attending: Urology | Admitting: Urology

## 2023-09-27 ENCOUNTER — Encounter (HOSPITAL_COMMUNITY)
Admission: AD | Disposition: A | Discharge status: Home / Self Care | Payer: Self-pay | Source: Ambulatory Visit | Attending: Urology

## 2023-09-27 ENCOUNTER — Inpatient Hospital Stay (HOSPITAL_COMMUNITY)

## 2023-09-27 ENCOUNTER — Other Ambulatory Visit: Payer: Self-pay | Admitting: Urology

## 2023-09-27 ENCOUNTER — Other Ambulatory Visit: Payer: Self-pay

## 2023-09-27 ENCOUNTER — Encounter (HOSPITAL_COMMUNITY): Payer: Self-pay | Admitting: Urology

## 2023-09-27 DIAGNOSIS — Z87440 Personal history of urinary (tract) infections: Secondary | ICD-10-CM | POA: Diagnosis not present

## 2023-09-27 DIAGNOSIS — F419 Anxiety disorder, unspecified: Secondary | ICD-10-CM | POA: Insufficient documentation

## 2023-09-27 DIAGNOSIS — Z87891 Personal history of nicotine dependence: Secondary | ICD-10-CM | POA: Insufficient documentation

## 2023-09-27 DIAGNOSIS — M1990 Unspecified osteoarthritis, unspecified site: Secondary | ICD-10-CM | POA: Diagnosis not present

## 2023-09-27 DIAGNOSIS — G4730 Sleep apnea, unspecified: Secondary | ICD-10-CM | POA: Diagnosis not present

## 2023-09-27 DIAGNOSIS — E785 Hyperlipidemia, unspecified: Secondary | ICD-10-CM | POA: Insufficient documentation

## 2023-09-27 DIAGNOSIS — N201 Calculus of ureter: Secondary | ICD-10-CM | POA: Diagnosis present

## 2023-09-27 DIAGNOSIS — F32A Depression, unspecified: Secondary | ICD-10-CM | POA: Diagnosis not present

## 2023-09-27 DIAGNOSIS — N390 Urinary tract infection, site not specified: Secondary | ICD-10-CM | POA: Insufficient documentation

## 2023-09-27 DIAGNOSIS — R7303 Prediabetes: Secondary | ICD-10-CM | POA: Diagnosis not present

## 2023-09-27 DIAGNOSIS — K219 Gastro-esophageal reflux disease without esophagitis: Secondary | ICD-10-CM | POA: Diagnosis not present

## 2023-09-27 HISTORY — PX: CYSTOSCOPY WITH RETROGRADE PYELOGRAM, URETEROSCOPY AND STENT PLACEMENT: SHX5789

## 2023-09-27 SURGERY — CYSTOURETEROSCOPY, WITH RETROGRADE PYELOGRAM AND STENT INSERTION
Anesthesia: General | Site: Bladder | Laterality: Left

## 2023-09-27 MED ORDER — PROPOFOL 10 MG/ML IV BOLUS
INTRAVENOUS | Status: DC | PRN
Start: 2023-09-27 — End: 2023-09-27
  Administered 2023-09-27: 150 mg via INTRAVENOUS

## 2023-09-27 MED ORDER — LACTATED RINGERS IV SOLN: INTRAVENOUS | Status: DC

## 2023-09-27 MED ORDER — ONDANSETRON HCL 4 MG/2ML IJ SOLN
INTRAMUSCULAR | Status: DC | PRN
  Administered 2023-09-27: 4 mg via INTRAVENOUS

## 2023-09-27 MED ORDER — PHENYLEPHRINE HCL (PRESSORS) 10 MG/ML IV SOLN
INTRAVENOUS | Status: DC | PRN
  Administered 2023-09-27: 80 ug via INTRAVENOUS
  Administered 2023-09-27: 100 ug via INTRAVENOUS
  Administered 2023-09-27: 80 ug via INTRAVENOUS

## 2023-09-27 MED ORDER — OXYCODONE HCL 5 MG PO TABS: 5.0000 mg | ORAL_TABLET | Freq: Once | ORAL | Status: DC | PRN

## 2023-09-27 MED ORDER — FENTANYL CITRATE (PF) 100 MCG/2ML IJ SOLN
INTRAMUSCULAR | Status: AC
  Filled 2023-09-27: qty 2

## 2023-09-27 MED ORDER — LIDOCAINE HCL (CARDIAC) PF 100 MG/5ML IV SOSY
PREFILLED_SYRINGE | INTRAVENOUS | Status: DC | PRN
  Administered 2023-09-27: 20 mg via INTRAVENOUS

## 2023-09-27 MED ORDER — DROPERIDOL 2.5 MG/ML IJ SOLN: 0.6250 mg | Freq: Once | INTRAMUSCULAR | Status: DC | PRN

## 2023-09-27 MED ORDER — CHLORHEXIDINE GLUCONATE 0.12 % MT SOLN
15.0000 mL | Freq: Once | OROMUCOSAL | Status: AC
  Administered 2023-09-27: 15 mL via OROMUCOSAL

## 2023-09-27 MED ORDER — ACETAMINOPHEN 500 MG PO TABS
1000.0000 mg | ORAL_TABLET | Freq: Once | ORAL | Status: AC
  Administered 2023-09-27: 1000 mg via ORAL
  Filled 2023-09-27: qty 2

## 2023-09-27 MED ORDER — FENTANYL CITRATE (PF) 100 MCG/2ML IJ SOLN
INTRAMUSCULAR | Status: DC | PRN
  Administered 2023-09-27: 100 ug via INTRAVENOUS

## 2023-09-27 MED ORDER — MIDAZOLAM HCL 2 MG/2ML IJ SOLN
INTRAMUSCULAR | Status: AC
  Filled 2023-09-27: qty 2

## 2023-09-27 MED ORDER — SODIUM CHLORIDE 0.9 % IV SOLN
1000.0000 mg | Freq: Three times a day (TID) | INTRAVENOUS | Status: DC
  Administered 2023-09-27: 1000 mg via INTRAVENOUS
  Filled 2023-09-27: qty 20

## 2023-09-27 MED ORDER — OXYCODONE HCL 5 MG/5ML PO SOLN: 5.0000 mg | Freq: Once | ORAL | Status: DC | PRN

## 2023-09-27 MED ORDER — DEXAMETHASONE SODIUM PHOSPHATE 10 MG/ML IJ SOLN
INTRAMUSCULAR | Status: AC
  Filled 2023-09-27: qty 1

## 2023-09-27 MED ORDER — TAMSULOSIN HCL 0.4 MG PO CAPS: 0.4000 mg | ORAL_CAPSULE | Freq: Every day | ORAL | 0 refills | Status: DC

## 2023-09-27 MED ORDER — FENTANYL CITRATE PF 50 MCG/ML IJ SOSY: 25.0000 ug | PREFILLED_SYRINGE | INTRAMUSCULAR | Status: DC | PRN

## 2023-09-27 MED ORDER — SODIUM CHLORIDE 0.9 % IR SOLN
Status: DC | PRN
  Administered 2023-09-27: 3000 mL via INTRAVESICAL

## 2023-09-27 MED ORDER — AMOXICILLIN-POT CLAVULANATE 875-125 MG PO TABS: 1.0000 | ORAL_TABLET | Freq: Two times a day (BID) | ORAL | 0 refills | Status: DC

## 2023-09-27 MED ORDER — PROPOFOL 10 MG/ML IV BOLUS
INTRAVENOUS | Status: AC
Start: 2023-09-27 — End: ?
  Filled 2023-09-27: qty 20

## 2023-09-27 MED ORDER — IOHEXOL 300 MG/ML  SOLN
INTRAMUSCULAR | Status: DC | PRN
  Administered 2023-09-27: 9 mL

## 2023-09-27 MED ORDER — TRAMADOL HCL 50 MG PO TABS: 50.0000 mg | ORAL_TABLET | Freq: Four times a day (QID) | ORAL | 0 refills | Status: AC | PRN

## 2023-09-27 MED ORDER — DEXAMETHASONE SODIUM PHOSPHATE 4 MG/ML IJ SOLN
INTRAMUSCULAR | Status: DC | PRN
  Administered 2023-09-27: 5 mg via INTRAVENOUS

## 2023-09-27 MED ORDER — PHENYLEPHRINE 80 MCG/ML (10ML) SYRINGE FOR IV PUSH (FOR BLOOD PRESSURE SUPPORT)
PREFILLED_SYRINGE | INTRAVENOUS | Status: AC
  Filled 2023-09-27: qty 10

## 2023-09-27 MED ORDER — ONDANSETRON HCL 4 MG/2ML IJ SOLN
INTRAMUSCULAR | Status: AC
  Filled 2023-09-27: qty 2

## 2023-09-27 MED ORDER — HYOSCYAMINE SULFATE 0.125 MG PO TBDP: 0.1250 mg | ORAL_TABLET | Freq: Four times a day (QID) | ORAL | 0 refills | Status: AC | PRN

## 2023-09-27 SURGICAL SUPPLY — 18 items
BAG URO CATCHER STRL LF (MISCELLANEOUS) ×1 IMPLANT
BASKET ZERO TIP NITINOL 2.4FR (BASKET) IMPLANT
CATH URETL OPEN 5X70 (CATHETERS) ×1 IMPLANT
CLOTH BEACON ORANGE TIMEOUT ST (SAFETY) ×1 IMPLANT
DRSG TEGADERM 2-3/8X2-3/4 SM (GAUZE/BANDAGES/DRESSINGS) IMPLANT
FIBER LASER MOSES 200 DFL (Laser) IMPLANT
GLOVE BIO SURGEON STRL SZ 6.5 (GLOVE) ×1 IMPLANT
GOWN STRL REUS W/ TWL LRG LVL3 (GOWN DISPOSABLE) ×1 IMPLANT
GUIDEWIRE STR DUAL SENSOR (WIRE) ×1 IMPLANT
KIT TURNOVER KIT A (KITS) IMPLANT
MANIFOLD NEPTUNE II (INSTRUMENTS) ×1 IMPLANT
PACK CYSTO (CUSTOM PROCEDURE TRAY) ×1 IMPLANT
SHEATH NAVIGATOR HD 11/13X28 (SHEATH) IMPLANT
SHEATH NAVIGATOR HD 11/13X36 (SHEATH) IMPLANT
STENT URET 6FRX24 CONTOUR (STENTS) IMPLANT
TRACTIP FLEXIVA PULS ID 200XHI (Laser) IMPLANT
TUBING CONNECTING 10 (TUBING) ×1 IMPLANT
TUBING UROLOGY SET (TUBING) ×1 IMPLANT

## 2023-09-27 NOTE — H&P (Signed)
 CC/HPI: cc: microscopic hematuria, UTIs, urolithiasis   11/22/2022: 68 year old woman initially seen for microscopic hematuria (evaluation May 2023) underwent left ureteroscopy with laser lithotripsy in February 2024. She comes in today with a few weeks of urinary urgency, frequency but also the feeling that she cannot empty. She feels like she may have a UTI. No hematuria or flank pain. PVR around 90 cc. She was not able to leave a urine sample.   12/05/2022:  Patient presents for 2-week follow-up on UTI. Culture at last visit grew pansensitive Klebsiella pneumonia. Patient has completed her course of Cipro . She states she is not on vaginal Estrace cream. Since last seen, patient states that her symptoms have fully resolved. She is at baseline voiding function. Today, she denies dysuria, gross hematuria, flank pain, fever/chills, nausea/vomiting.   02/08/2023: 68 year old woman with a negative microscopic hematuria evaluation in 2023 and urolithiasis here for follow-up. She had an episode of acute cystitis in August 2024. She is feeling back to baseline now.   03/21/2023: Patient here today with concerns of a possible UTI. She has been having malodorous urine for the past 2 to 3 weeks. This is not associated with any increase in frequency/urgency, changes in force of stream, dysuria or gross hematuria. She denies flank and lower back pain. Patient has not been using topical estrogen that was previously recommended.   05/17/2023 68 year old female who presents today for concerns of malodorous urine, dysuria and suprapubic discomfort. She denies gross hematuria and fevers.   08/09/2023: 69 year old woman with a history of urolithiasis and chronic cystitis here for follow-up. She was last seen in January 2025 for UTI. She has not been using the vaginal estradiol cream lately. She is here with her husband today Josiah Nigh). No flank pain or hematuria.   09/10/2023: 68 year old woman with a history of urolithiasis  and chronic cystitis went to the ED over the weekend for left lower quadrant pain found to have a UTI. CT abdomen pelvis also showed a cluster of stones that has migrated into the renal pelvis. She has previously undergone ureteroscopy for stones in February 2024. She is feeling much better after being treated with antibiotic for UTI. She is going to [place] next week for a little getaway with her husband. She is also been seen by neurology recently.   09/27/2023: 68 year old woman with a history of urolithiasis and chronic cystitis developed acute onset left lower quadrant pain overnight. Patient has multiple stones that have migrated into the renal pelvis on recent imaging. Urinalysis appears infected. She has had some nausea but no vomiting. No fevers or chills.     ALLERGIES: No Allergies    MEDICATIONS: Crestor  20 MG Tablet  Abilify 10 MG Tablet  ALPRAZolam 0.5 MG Tablet tablet PRN  Effexor Xr  Memantine Hcl  Vitamin B12     GU PSH: Cystoscopy - 09/27/2021 Locm 300-399Mg /Ml Iodine,1Ml - 2023 Ureteroscopic laser litho, Left - 06/05/2022, Left - 05/22/2022       PSH Notes: Knee Replacement, Wrist Incision, fallopian tubes removed   NON-GU PSH: Revise Knee Joint - 2013 Visit Complexity (formerly GPC1X) - 05/16/2023, 03/21/2023     GU PMH: Acute Cystitis/UTI - 09/10/2023, - 06/12/2022, - 05/02/2022 Chronic cystitis (with hematuria) - 09/10/2023, - 08/09/2023, - 05/16/2023, - 03/21/2023, - 02/08/2023, - 12/05/2022, - 11/22/2022, - 08/03/2022, - 05/02/2022, - 2023 Renal calculus - 09/10/2023, - 08/09/2023, - 02/08/2023, - 11/22/2022, - 08/03/2022, - 06/12/2022, - 05/30/2022, - 05/02/2022, - 11/06/2021, - 09/27/2021 Microscopic hematuria - 08/09/2023, -  11/06/2021, - 09/27/2021, - 2023, - 2023 Renal cyst - 08/09/2023, - 11/06/2021 Urinary Frequency (Stable) - 11/22/2022, Increased urinary frequency, - 2014 Urinary Urgency - 11/22/2022 Ureter, Right, 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 Mixed incontinence,  Urge and stress incontinence - 2014 Urinary incontinence, Unspec, Urinary incontinence - 2014 Weak Urinary Stream, Weak urinary stream - 2014      PMH Notes:  1898-04-30 00:00:00 - Note: Normal Routine History And Physical Adult  2011-11-21 08:35:16 - Note: Arthritis   NON-GU PMH: Pyuria/other UA findings - 03/21/2023 Anxiety, Anxiety (Symptom) - 2014 Personal history of other diseases of the digestive system, History of esophageal reflux - 2014 Personal history of other diseases of the nervous system and sense organs, History of sleep apnea - 2014 Personal history of other mental and behavioral disorders, History of depression - 2014 Depression GERD    FAMILY HISTORY: 1 Daughter - Daughter 1 son - Son copd - Father Death In The Family Father - Runs In Family Death In The Family Mother - Runs In Family Family Health Status Number - Runs In Family multiple myeloma - Mother   SOCIAL HISTORY: Marital Status: Married Preferred Language: English; Ethnicity: Not Hispanic Or Latino; Race: White Current Smoking Status: Patient does not smoke anymore.   Tobacco Use Assessment Completed: Used Tobacco in last 30 days? Has never drank.  Drinks 2 caffeinated drinks per day.     Notes: Former smoker, Marital History - Currently Married, Caffeine Use, Being A Therapist, sports, Occupation:   REVIEW OF SYSTEMS:    GU Review Female:   Patient denies frequent urination, hard to postpone urination, burning /pain with urination, get up at night to urinate, leakage of urine, stream starts and stops, trouble starting your stream, have to strain to urinate, and being pregnant.  Gastrointestinal (Upper):   Patient reports nausea. Patient denies vomiting and indigestion/ heartburn.  Gastrointestinal (Lower):   Patient denies diarrhea and constipation.  Constitutional:   Patient denies fever, night sweats, weight loss, and fatigue.  Skin:   Patient denies itching and skin rash/ lesion.  Eyes:   Patient  denies blurred vision and double vision.  Ears/ Nose/ Throat:   Patient denies sore throat and sinus problems.  Hematologic/Lymphatic:   Patient denies swollen glands and easy bruising.  Cardiovascular:   Patient denies leg swelling and chest pains.  Respiratory:   Patient denies cough and shortness of breath.  Endocrine:   Patient denies excessive thirst.  Musculoskeletal:   Patient denies back pain and joint pain.  Neurological:   Patient denies headaches and dizziness.  Psychologic:   Patient denies depression and anxiety.   Notes: Left flank pain    VITAL SIGNS:      09/27/2023 10:46 AM  BP 123/82 mmHg  Pulse 96 /min  Temperature 97.5 F / 36.3 C   MULTI-SYSTEM PHYSICAL EXAMINATION:    Constitutional: Well-nourished. No physical deformities. Normally developed. Good grooming.  Neck: Neck symmetrical, not swollen. Normal tracheal position.  Respiratory: No labored breathing, no use of accessory muscles.   Skin: No paleness, no jaundice, no cyanosis. No lesion, no ulcer, no rash.  Neurologic / Psychiatric: Oriented to time, oriented to place, oriented to person. No depression, no anxiety, no agitation.  Eyes: Normal conjunctivae. Normal eyelids.  Ears, Nose, Mouth, and Throat: Left ear no scars, no lesions, no masses. Right ear no scars, no lesions, no masses. Nose no scars, no lesions, no masses. Normal hearing. Normal lips.  Musculoskeletal: Normal gait and station of  head and neck.     Complexity of Data:  Records Review:   Previous Patient Records, POC Tool  Urine Test Review:   Urinalysis  X-Ray Review: KUB: Reviewed Films. Discussed With Patient. multiple stones in left kidney, UPJ and possible more distal fragment    PROCEDURES:         KUB - 16109  A single view of the abdomen is obtained.      Patient confirmed No Neulasta OnPro Device.           Urinalysis w/Scope Dipstick Dipstick Cont'd Micro  Color: Yellow Bilirubin: Neg mg/dL WBC/hpf: 40 - 60/AVW   Appearance: Cloudy Ketones: Neg mg/dL RBC/hpf: 20 - 09/WJX  Specific Gravity: 1.025 Blood: 3+ ery/uL Bacteria: Many (>50/hpf)  pH: 6.0 Protein: 2+ mg/dL Cystals: NS (Not Seen)  Glucose: Neg mg/dL Urobilinogen: 0.2 mg/dL Casts: NS (Not Seen)    Nitrites: Positive Trichomonas: Not Present    Leukocyte Esterase: 2+ leu/uL Mucous: Present      Epithelial Cells: 0 - 5/hpf      Yeast: NS (Not Seen)      Sperm: Not Present         Gentamicin 80mg  - 96372, [id number] 80 mg given  0 wasted    Qty: 80 Adm. By: Georgina Garcia  Unit: mg Adm. On: 09/27/2023 10:58 AM  Route: IM Lot No: [id number]  Freq: None Exp. Date: 07/29/2024    Mfgr.:   Site: None         Ketoralac 60mg  - 96372, [id number] 60 mg given  0 Wasted    Qty: 60 Adm. By: Georgina Garcia  Unit: mg Adm. On: 09/27/2023 10:58 AM  Route: IM Lot No: [id number]  Freq: None Exp. Date:     Mfgr.:   Site: Left Buttock         Morphine  4mg  - J2270, [id number] 4 MG given 0 Waste    Qty: 4 Adm. By: Georgina Garcia  Unit: mg Adm. On: 09/27/2023 11:30 AM  Route: IM Lot No: [id number]  Freq: None Exp. Date: 04/30/2024    Mfgr.:   Site: Right Buttock   ASSESSMENT:      ICD-10 Details  1 GU:   Acute Cystitis/UTI - N30.00 Acute, Systemic Symptoms  2   Renal calculus - N20.0 Chronic, Stable  3   Ureteral calculus - N20.1 Acute, Systemic Symptoms   PLAN:           Orders X-Rays: KUB          Schedule         Document Letter(s):  Created for Patient: Clinical Summary         Notes:   Urolithiasis:  - Patient has renal pelvis calculi that are causing obstruction in setting of UTI  - Discussed risks and benefits of cystoscopy with left ureteral stent placement today. She already has surgery scheduled for 6/10 for definitive removal of stones.  - Will send urine for culture and treat her empirically for UTI

## 2023-09-27 NOTE — H&P (View-Only) (Signed)
 CC/HPI: cc: microscopic hematuria, UTIs, urolithiasis   11/22/2022: 68 year old woman initially seen for microscopic hematuria (evaluation May 2023) underwent left ureteroscopy with laser lithotripsy in February 2024. She comes in today with a few weeks of urinary urgency, frequency but also the feeling that she cannot empty. She feels like she may have a UTI. No hematuria or flank pain. PVR around 90 cc. She was not able to leave a urine sample.   12/05/2022:  Patient presents for 2-week follow-up on UTI. Culture at last visit grew pansensitive Klebsiella pneumonia. Patient has completed her course of Cipro . She states she is not on vaginal Estrace cream. Since last seen, patient states that her symptoms have fully resolved. She is at baseline voiding function. Today, she denies dysuria, gross hematuria, flank pain, fever/chills, nausea/vomiting.   02/08/2023: 68 year old woman with a negative microscopic hematuria evaluation in 2023 and urolithiasis here for follow-up. She had an episode of acute cystitis in August 2024. She is feeling back to baseline now.   03/21/2023: Patient here today with concerns of a possible UTI. She has been having malodorous urine for the past 2 to 3 weeks. This is not associated with any increase in frequency/urgency, changes in force of stream, dysuria or gross hematuria. She denies flank and lower back pain. Patient has not been using topical estrogen that was previously recommended.   05/17/2023 68 year old female who presents today for concerns of malodorous urine, dysuria and suprapubic discomfort. She denies gross hematuria and fevers.   08/09/2023: 69 year old woman with a history of urolithiasis and chronic cystitis here for follow-up. She was last seen in January 2025 for UTI. She has not been using the vaginal estradiol cream lately. She is here with her husband today Josiah Nigh). No flank pain or hematuria.   09/10/2023: 68 year old woman with a history of urolithiasis  and chronic cystitis went to the ED over the weekend for left lower quadrant pain found to have a UTI. CT abdomen pelvis also showed a cluster of stones that has migrated into the renal pelvis. She has previously undergone ureteroscopy for stones in February 2024. She is feeling much better after being treated with antibiotic for UTI. She is going to [place] next week for a little getaway with her husband. She is also been seen by neurology recently.   09/27/2023: 68 year old woman with a history of urolithiasis and chronic cystitis developed acute onset left lower quadrant pain overnight. Patient has multiple stones that have migrated into the renal pelvis on recent imaging. Urinalysis appears infected. She has had some nausea but no vomiting. No fevers or chills.     ALLERGIES: No Allergies    MEDICATIONS: Crestor  20 MG Tablet  Abilify 10 MG Tablet  ALPRAZolam 0.5 MG Tablet tablet PRN  Effexor Xr  Memantine Hcl  Vitamin B12     GU PSH: Cystoscopy - 09/27/2021 Locm 300-399Mg /Ml Iodine,1Ml - 2023 Ureteroscopic laser litho, Left - 06/05/2022, Left - 05/22/2022       PSH Notes: Knee Replacement, Wrist Incision, fallopian tubes removed   NON-GU PSH: Revise Knee Joint - 2013 Visit Complexity (formerly GPC1X) - 05/16/2023, 03/21/2023     GU PMH: Acute Cystitis/UTI - 09/10/2023, - 06/12/2022, - 05/02/2022 Chronic cystitis (with hematuria) - 09/10/2023, - 08/09/2023, - 05/16/2023, - 03/21/2023, - 02/08/2023, - 12/05/2022, - 11/22/2022, - 08/03/2022, - 05/02/2022, - 2023 Renal calculus - 09/10/2023, - 08/09/2023, - 02/08/2023, - 11/22/2022, - 08/03/2022, - 06/12/2022, - 05/30/2022, - 05/02/2022, - 11/06/2021, - 09/27/2021 Microscopic hematuria - 08/09/2023, -  11/06/2021, - 09/27/2021, - 2023, - 2023 Renal cyst - 08/09/2023, - 11/06/2021 Urinary Frequency (Stable) - 11/22/2022, Increased urinary frequency, - 2014 Urinary Urgency - 11/22/2022 Ureter, Right, 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 Mixed incontinence,  Urge and stress incontinence - 2014 Urinary incontinence, Unspec, Urinary incontinence - 2014 Weak Urinary Stream, Weak urinary stream - 2014      PMH Notes:  1898-04-30 00:00:00 - Note: Normal Routine History And Physical Adult  2011-11-21 08:35:16 - Note: Arthritis   NON-GU PMH: Pyuria/other UA findings - 03/21/2023 Anxiety, Anxiety (Symptom) - 2014 Personal history of other diseases of the digestive system, History of esophageal reflux - 2014 Personal history of other diseases of the nervous system and sense organs, History of sleep apnea - 2014 Personal history of other mental and behavioral disorders, History of depression - 2014 Depression GERD    FAMILY HISTORY: 1 Daughter - Daughter 1 son - Son copd - Father Death In The Family Father - Runs In Family Death In The Family Mother - Runs In Family Family Health Status Number - Runs In Family multiple myeloma - Mother   SOCIAL HISTORY: Marital Status: Married Preferred Language: English; Ethnicity: Not Hispanic Or Latino; Race: White Current Smoking Status: Patient does not smoke anymore.   Tobacco Use Assessment Completed: Used Tobacco in last 30 days? Has never drank.  Drinks 2 caffeinated drinks per day.     Notes: Former smoker, Marital History - Currently Married, Caffeine Use, Being A Therapist, sports, Occupation:   REVIEW OF SYSTEMS:    GU Review Female:   Patient denies frequent urination, hard to postpone urination, burning /pain with urination, get up at night to urinate, leakage of urine, stream starts and stops, trouble starting your stream, have to strain to urinate, and being pregnant.  Gastrointestinal (Upper):   Patient reports nausea. Patient denies vomiting and indigestion/ heartburn.  Gastrointestinal (Lower):   Patient denies diarrhea and constipation.  Constitutional:   Patient denies fever, night sweats, weight loss, and fatigue.  Skin:   Patient denies itching and skin rash/ lesion.  Eyes:   Patient  denies blurred vision and double vision.  Ears/ Nose/ Throat:   Patient denies sore throat and sinus problems.  Hematologic/Lymphatic:   Patient denies swollen glands and easy bruising.  Cardiovascular:   Patient denies leg swelling and chest pains.  Respiratory:   Patient denies cough and shortness of breath.  Endocrine:   Patient denies excessive thirst.  Musculoskeletal:   Patient denies back pain and joint pain.  Neurological:   Patient denies headaches and dizziness.  Psychologic:   Patient denies depression and anxiety.   Notes: Left flank pain    VITAL SIGNS:      09/27/2023 10:46 AM  BP 123/82 mmHg  Pulse 96 /min  Temperature 97.5 F / 36.3 C   MULTI-SYSTEM PHYSICAL EXAMINATION:    Constitutional: Well-nourished. No physical deformities. Normally developed. Good grooming.  Neck: Neck symmetrical, not swollen. Normal tracheal position.  Respiratory: No labored breathing, no use of accessory muscles.   Skin: No paleness, no jaundice, no cyanosis. No lesion, no ulcer, no rash.  Neurologic / Psychiatric: Oriented to time, oriented to place, oriented to person. No depression, no anxiety, no agitation.  Eyes: Normal conjunctivae. Normal eyelids.  Ears, Nose, Mouth, and Throat: Left ear no scars, no lesions, no masses. Right ear no scars, no lesions, no masses. Nose no scars, no lesions, no masses. Normal hearing. Normal lips.  Musculoskeletal: Normal gait and station of  head and neck.     Complexity of Data:  Records Review:   Previous Patient Records, POC Tool  Urine Test Review:   Urinalysis  X-Ray Review: KUB: Reviewed Films. Discussed With Patient. multiple stones in left kidney, UPJ and possible more distal fragment    PROCEDURES:         KUB - 16109  A single view of the abdomen is obtained.      Patient confirmed No Neulasta OnPro Device.           Urinalysis w/Scope Dipstick Dipstick Cont'd Micro  Color: Yellow Bilirubin: Neg mg/dL WBC/hpf: 40 - 60/AVW   Appearance: Cloudy Ketones: Neg mg/dL RBC/hpf: 20 - 09/WJX  Specific Gravity: 1.025 Blood: 3+ ery/uL Bacteria: Many (>50/hpf)  pH: 6.0 Protein: 2+ mg/dL Cystals: NS (Not Seen)  Glucose: Neg mg/dL Urobilinogen: 0.2 mg/dL Casts: NS (Not Seen)    Nitrites: Positive Trichomonas: Not Present    Leukocyte Esterase: 2+ leu/uL Mucous: Present      Epithelial Cells: 0 - 5/hpf      Yeast: NS (Not Seen)      Sperm: Not Present         Gentamicin 80mg  - 96372, J1580 80 mg given  0 wasted    Qty: 80 Adm. By: Georgina Garcia  Unit: mg Adm. On: 09/27/2023 10:58 AM  Route: IM Lot No: [id number]  Freq: None Exp. Date: 07/29/2024    Mfgr.:   Site: None         Ketoralac 60mg  - 96372, [id number] 60 mg given  0 Wasted    Qty: 60 Adm. By: Georgina Garcia  Unit: mg Adm. On: 09/27/2023 10:58 AM  Route: IM Lot No: [id number]  Freq: None Exp. Date:     Mfgr.:   Site: Left Buttock         Morphine  4mg  - J2270, [id number] 4 MG given 0 Waste    Qty: 4 Adm. By: Georgina Garcia  Unit: mg Adm. On: 09/27/2023 11:30 AM  Route: IM Lot No: [id number]  Freq: None Exp. Date: 04/30/2024    Mfgr.:   Site: Right Buttock   ASSESSMENT:      ICD-10 Details  1 GU:   Acute Cystitis/UTI - N30.00 Acute, Systemic Symptoms  2   Renal calculus - N20.0 Chronic, Stable  3   Ureteral calculus - N20.1 Acute, Systemic Symptoms   PLAN:           Orders X-Rays: KUB          Schedule         Document Letter(s):  Created for Patient: Clinical Summary         Notes:   Urolithiasis:  - Patient has renal pelvis calculi that are causing obstruction in setting of UTI  - Discussed risks and benefits of cystoscopy with left ureteral stent placement today. She already has surgery scheduled for 6/10 for definitive removal of stones.  - Will send urine for culture and treat her empirically for UTI

## 2023-09-27 NOTE — Interval H&P Note (Signed)
 History and Physical Interval Note:  09/27/2023 3:40 PM  Jasmine [name]  has presented today for surgery, with the diagnosis of Left ureteral stone.  The various methods of treatment have been discussed with the patient and family. After consideration of risks, benefits and other options for treatment, the patient has consented to  Procedure(s): CYSTOURETEROSCOPY, WITH RETROGRADE PYELOGRAM AND STENT INSERTION (Left) as a surgical intervention.  The patient's history has been reviewed, patient examined, no change in status, stable for surgery.  I have reviewed the patient's chart and labs.  Questions were answered to the patient's satisfaction.     Dustan Hyams D Ajanae Virag

## 2023-09-27 NOTE — Anesthesia Postprocedure Evaluation (Signed)
 Anesthesia Post Note  Patient: Jasmine [name]  Procedure(s) Performed: Alfreida Inches, WITH RETROGRADE PYELOGRAM AND STENT INSERTION (Left: Bladder)     Patient location during evaluation: PACU Anesthesia Type: General Level of consciousness: awake and alert, patient cooperative and oriented Pain management: pain level controlled Vital Signs Assessment: post-procedure vital signs reviewed and stable Respiratory status: spontaneous breathing, nonlabored ventilation and respiratory function stable Cardiovascular status: blood pressure returned to baseline and stable Postop Assessment: no apparent nausea or vomiting Anesthetic complications: no   No notable events documented.  Last Vitals:  Vitals:   09/27/23 1630 09/27/23 1641  BP: 115/67 103/64  Pulse: 92 82  Resp: 15 15  Temp:    SpO2: 96% 97%    Last Pain:  Vitals:   09/27/23 1630  TempSrc:   PainSc: 0-No pain                 Rex Oesterle,E. Zedrick Springsteen

## 2023-09-27 NOTE — Op Note (Signed)
 Preoperative diagnosis:  Left ureteral calculus UTI   Postoperative diagnosis:  Left ureteral calculus UTI   Procedure:  Cystoscopy left ureteral stent placement - no string left retrograde pyelography with interpretation   Surgeon: Perley Bradley, MD  Anesthesia: General  Complications: None  Intraoperative findings:  left retrograde pyelography demonstrated a filling defect within the left ureter consistent with the patient's known calculi without other abnormalities.  EBL: Minimal  Specimens: None  Indication: Jasmine [name] is a 68 y.o. patient with multiple left renal pelvis calculi that have migrated into proximal ureter and signs of UTI. After reviewing the management options for treatment, he elected to proceed with the above surgical procedure(s). We have discussed the potential benefits and risks of the procedure, side effects of the proposed treatment, the likelihood of the patient achieving the goals of the procedure, and any potential problems that might occur during the procedure or recuperation. Informed consent has been obtained.  Description of procedure:  The patient was taken to the operating room and general anesthesia was induced.  The patient was placed in the dorsal lithotomy position, prepped and draped in the usual sterile fashion, and preoperative antibiotics were administered. A preoperative time-out was performed.   Cystourethroscopy was performed.  The patient's urethra was examined and was normal. The bladder was then systematically examined in its entirety. There was no evidence for any bladder tumors, stones, or other mucosal pathology.    Attention then turned to the leftureteral orifice and a ureteral catheter was used to intubate the ureteral orifice.  Omnipaque  contrast was injected through the ureteral catheter and a retrograde pyelogram was performed with findings as dictated above.  A 0.38 sensor guidewire was then advanced up the left  ureter into the renal pelvis under fluoroscopic guidance.  The wire was then backloaded through the cystoscope and a ureteral stent was advance over the wire using Seldinger technique.  The stent was positioned appropriately under fluoroscopic and cystoscopic guidance.  The wire was then removed with an adequate stent curl noted in the renal pelvis as well as in the bladder.  The bladder was then emptied and the procedure ended.  The patient appeared to tolerate the procedure well and without complications.  The patient was able to be awakened and transferred to the recovery unit in satisfactory condition.   Plan: patient is scheduled for ureteroscopy on 6/10  Perley Bradley, M.D.

## 2023-09-27 NOTE — Anesthesia Procedure Notes (Signed)
 Procedure Name: LMA Insertion Date/Time: 09/27/2023 3:52 PM  Performed by: Inez Manger, CRNAPre-anesthesia Checklist: Patient identified, Emergency Drugs available, Suction available and Patient being monitored Patient Re-evaluated:Patient Re-evaluated prior to induction Oxygen Delivery Method: Circle System Utilized Preoxygenation: Pre-oxygenation with 100% oxygen Induction Type: IV induction LMA: LMA inserted LMA Size: 4.0 Number of attempts: 1 Airway Equipment and Method: Bite block Placement Confirmation: positive ETCO2 Tube secured with: Tape Dental Injury: Teeth and Oropharynx as per pre-operative assessment

## 2023-09-27 NOTE — Transfer of Care (Signed)
 Immediate Anesthesia Transfer of Care Note  Patient: Jasmine [name]  Procedure(s) Performed: Alfreida Inches, WITH RETROGRADE PYELOGRAM AND STENT INSERTION (Left: Bladder)  Patient Location: PACU  Anesthesia Type:General  Level of Consciousness: awake  Airway & Oxygen Therapy: Patient Spontanous Breathing  Post-op Assessment: Report given to RN and Post -op Vital signs reviewed and stable  Post vital signs: Reviewed and stable  Last Vitals:  Vitals Value Taken Time  BP    Temp    Pulse 85 09/27/23 1616  Resp    SpO2 92 % 09/27/23 1616  Vitals shown include unfiled device data.  Last Pain:  Vitals:   09/27/23 1338  TempSrc:   PainSc: 0-No pain         Complications: No notable events documented.

## 2023-09-27 NOTE — Anesthesia Preprocedure Evaluation (Addendum)
 Anesthesia Evaluation  Patient identified by MRN, date of birth, ID band Patient awake    Reviewed: Allergy & Precautions, H&P , NPO status , Patient's Chart, lab work & pertinent test results  Airway Mallampati: II  TM Distance: >3 FB Neck ROM: Full    Dental no notable dental hx.    Pulmonary sleep apnea , former smoker   Pulmonary exam normal breath sounds clear to auscultation       Cardiovascular (-) angina negative cardio ROS Normal cardiovascular exam Rhythm:Regular Rate:Normal  HLD  TTE 05/04/2022:  1. Left ventricular ejection fraction, by estimation, is 60 to 65%. The left ventricle has normal function. The left ventricle has no regional wall motion abnormalities. Left ventricular diastolic parameters are indeterminate. The average left ventricular global longitudinal strain is -21.8 %. The global longitudinal strain is normal.   2. Right ventricular systolic function is normal. The right ventricular size is normal. Tricuspid regurgitation signal is inadequate for assessing PA pressure.   3. The mitral valve is normal in structure. No evidence of mitral valve regurgitation. No evidence of mitral stenosis.   4. The aortic valve is normal in structure. Aortic valve regurgitation is not visualized. No aortic stenosis is present.   5. The inferior vena cava is normal in size with greater than 50% respiratory variability, suggesting right atrial pressure of 3 mmHg.     Neuro/Psych  PSYCHIATRIC DISORDERS Anxiety Depression       GI/Hepatic ,GERD  Controlled,,H/o diverticulitis   Endo/Other  Pre-diabetes  Renal/GU Renal disease (stone)stones     Musculoskeletal  (+) Arthritis ,    Abdominal   Peds  Hematology Lab Results      Component                Value               Date                      WBC                      6.8                 09/07/2023                HGB                      15.5 (H)             09/07/2023                HCT                      46.7 (H)            09/07/2023                MCV                      95.1                09/07/2023                PLT                      161                 09/07/2023  Anesthesia Other Findings   Reproductive/Obstetrics                             Anesthesia Physical Anesthesia Plan  ASA: 2  Anesthesia Plan: General   Post-op Pain Management: Tylenol  PO (pre-op)*   Induction: Intravenous  PONV Risk Score and Plan: 4 or greater and Ondansetron , Dexamethasone  and Treatment may vary due to age or medical condition  Airway Management Planned: LMA  Additional Equipment:   Intra-op Plan:   Post-operative Plan: Extubation in OR  Informed Consent: I have reviewed the patients History and Physical, chart, labs and discussed the procedure including the risks, benefits and alternatives for the proposed anesthesia with the patient or authorized representative who has indicated his/her understanding and acceptance.     Dental advisory given  Plan Discussed with: CRNA  Anesthesia Plan Comments: (Risks of anesthesia explained at length. This includes, but is not limited to, sore throat, damage to teeth, lips gums, tongue and vocal cords, nausea and vomiting, reactions to medications, stroke, heart attack, and death. All patient questions were answered and the patient wishes to proceed.  )       Anesthesia Quick Evaluation

## 2023-09-27 NOTE — Discharge Instructions (Addendum)
 Post stent placement instructions   Definitions:  Ureter: The duct that transports urine from the kidney to the bladder. Stent: A plastic hollow tube that is placed into the ureter, from the kidney to the bladder to prevent the ureter from swelling shut.  General instructions:  Despite the fact that no skin incisions were used, the area around the ureter and bladder is raw and irritated. The stent is a foreign body which can further irritate the bladder wall. This irritation is manifested by increased frequency of urination, both day and night, and by an increase in the urge to urinate. In some, the urge to urinate is present almost always. Sometimes the urge is strong enough that you may not be able to stop your self from urinating. This can often be controlled with medication but does not occur in everyone. A stent can safely be left in place for 3 months or greater.  You may see some blood in your urine while the stent is in place and a few days afterward. Do not be alarmed, even if the urine is clear for a while. Get off your feet and drink lots of fluids until clearing occurs. If you start to pass clots or don't improve, call us .  Diet:  You may return to your normal diet immediately. Because of the raw surface of your bladder, alcohol, spicy foods, foods high in acid and drinks with caffeine may cause irritation or frequency and should be used in moderation. To keep your urine flowing freely and avoid constipation, drink plenty of fluids during the day (8-10 glasses). Tip: Avoid cranberry juice because it is very acidic.  Activity:  Your physical activity doesn't need to be restricted. However, if you are very active, you may see some blood in the urine. We suggest that you reduce your activity under the circumstances until the bleeding has stopped.  Bowels:  It is important to keep your bowels regular during the postoperative period. Straining with bowel movements can cause bleeding. A  bowel movement every other day is reasonable. Use a mild laxative if needed, such as milk of magnesia 2-3 tablespoons, or 2 Dulcolax tablets. Call if you continue to have problems. If you had been taking narcotics for pain, before, during or after your surgery, you may be constipated. Take a laxative if necessary.  Medication:  You should resume your pre-surgery medications unless told not to. In addition you may be given an antibiotic to prevent or treat infection. Antibiotics are not always necessary. All medication should be taken as prescribed until the bottles are finished unless you are having an unusual reaction to one of the drugs.  Augmentin  will treat UTI Tamsulosin  can help with stent discomfort Hyoscyamine can help with bladder spasms/cramping Tramadol  is pain medication (if not severe then use tylenol )  Problems you should report to us :  a. Fever greater than 101F. b. Heavy bleeding, or clots (see notes above about blood in urine). c. Inability to urinate. d. Drug reactions (hives, rash, nausea, vomiting, diarrhea). e. Severe burning or pain with urination that is not improving.

## 2023-09-28 ENCOUNTER — Encounter (HOSPITAL_COMMUNITY): Payer: Self-pay | Admitting: Urology

## 2023-10-03 ENCOUNTER — Encounter (HOSPITAL_COMMUNITY): Payer: Self-pay | Admitting: Urology

## 2023-10-03 NOTE — Progress Notes (Signed)
 Spoke w/ via phone for pre-op interview---[place] and husband Josiah Nigh Lab needs dos----NONE         Lab results------ COVID test -----patient states asymptomatic no test needed Arrive at -------0530 NPO after MN NO Solid Food.   Pre-Surgery Ensure or G2:  Med rec completed Medications to take morning of surgery ----- Effexor, Namenda and effexor Diabetic medication -----  GLP1 agonist last dose: GLP1 instructions:  Patient instructed no nail polish to be worn day of surgery Patient instructed to bring photo id and insurance card day of surgery Patient aware to have Driver (ride ) / caregiver    for 24 hours after surgery - HUsband Laveda Ports Patient Special Instructions ----- Shower with antibacterial soap. Pre-Op special Instructions -----  Patient verbalized understanding of instructions that were given at this phone interview. Patient denies chest pain, sob, fever, cough at the interview.

## 2023-10-07 ENCOUNTER — Telehealth: Payer: Self-pay | Admitting: Neurology

## 2023-10-07 NOTE — Telephone Encounter (Signed)
 Spouse request pt be seen earlier out of concern for possible need to change medications

## 2023-10-07 NOTE — Anesthesia Preprocedure Evaluation (Addendum)
 Anesthesia Evaluation  Patient identified by MRN, date of birth, ID band Patient awake    Reviewed: Allergy & Precautions, H&P , NPO status , Patient's Chart, lab work & pertinent test results  Airway Mallampati: III  TM Distance: >3 FB Neck ROM: Full    Dental no notable dental hx. (+) Teeth Intact, Dental Advisory Given   Pulmonary sleep apnea , former smoker   Pulmonary exam normal breath sounds clear to auscultation       Cardiovascular + angina  negative cardio ROS Normal cardiovascular exam Rhythm:Regular Rate:Normal  HLD  TTE 05/04/2022:  1. Left ventricular ejection fraction, by estimation, is 60 to 65%. The left ventricle has normal function. The left ventricle has no regional wall motion abnormalities. Left ventricular diastolic parameters are indeterminate. The average left ventricular global longitudinal strain is -21.8 %. The global longitudinal strain is normal.   2. Right ventricular systolic function is normal. The right ventricular size is normal. Tricuspid regurgitation signal is inadequate for assessing PA pressure.   3. The mitral valve is normal in structure. No evidence of mitral valve regurgitation. No evidence of mitral stenosis.   4. The aortic valve is normal in structure. Aortic valve regurgitation is not visualized. No aortic stenosis is present.   5. The inferior vena cava is normal in size with greater than 50% respiratory variability, suggesting right atrial pressure of 3 mmHg.     Neuro/Psych  PSYCHIATRIC DISORDERS Anxiety Depression       GI/Hepatic ,GERD  Controlled,,H/o diverticulitis   Endo/Other    Class 3 obesityPre-diabetes  Renal/GU Renal disease (stone)stones     Musculoskeletal  (+) Arthritis ,    Abdominal   Peds  Hematology  (+) Blood dyscrasia, anemia   Anesthesia Other Findings   Reproductive/Obstetrics                             Anesthesia  Physical Anesthesia Plan  ASA: 2  Anesthesia Plan: General   Post-op Pain Management: Tylenol  PO (pre-op)*   Induction: Intravenous  PONV Risk Score and Plan: 4 or greater and Ondansetron , Dexamethasone  and Treatment may vary due to age or medical condition  Airway Management Planned: LMA  Additional Equipment:   Intra-op Plan:   Post-operative Plan: Extubation in OR  Informed Consent: I have reviewed the patients History and Physical, chart, labs and discussed the procedure including the risks, benefits and alternatives for the proposed anesthesia with the patient or authorized representative who has indicated his/her understanding and acceptance.     Dental advisory given  Plan Discussed with: CRNA  Anesthesia Plan Comments: (Risks of anesthesia explained at length. This includes, but is not limited to, sore throat, damage to teeth, lips gums, tongue and vocal cords, nausea and vomiting, reactions to medications, stroke, heart attack, and death. All patient questions were answered and the patient wishes to proceed.  )       Anesthesia Quick Evaluation

## 2023-10-08 ENCOUNTER — Ambulatory Visit (HOSPITAL_COMMUNITY)

## 2023-10-08 ENCOUNTER — Encounter (HOSPITAL_COMMUNITY): Payer: Self-pay | Admitting: Urology

## 2023-10-08 ENCOUNTER — Ambulatory Visit (HOSPITAL_COMMUNITY): Payer: Self-pay | Admitting: Anesthesiology

## 2023-10-08 ENCOUNTER — Encounter (HOSPITAL_COMMUNITY)
Admission: RE | Disposition: A | Discharge status: Home / Self Care | Payer: Self-pay | Source: Home / Self Care | Attending: Urology

## 2023-10-08 ENCOUNTER — Ambulatory Visit (HOSPITAL_COMMUNITY)
Admission: RE | Admit: 2023-10-08 | Discharge: 2023-10-08 | Disposition: A | Discharge status: Home / Self Care | Attending: Urology | Admitting: Urology

## 2023-10-08 ENCOUNTER — Ambulatory Visit (HOSPITAL_BASED_OUTPATIENT_CLINIC_OR_DEPARTMENT_OTHER): Payer: Self-pay | Admitting: Anesthesiology

## 2023-10-08 DIAGNOSIS — E785 Hyperlipidemia, unspecified: Secondary | ICD-10-CM | POA: Insufficient documentation

## 2023-10-08 DIAGNOSIS — Z87442 Personal history of urinary calculi: Secondary | ICD-10-CM | POA: Diagnosis not present

## 2023-10-08 DIAGNOSIS — M1990 Unspecified osteoarthritis, unspecified site: Secondary | ICD-10-CM | POA: Insufficient documentation

## 2023-10-08 DIAGNOSIS — Z6830 Body mass index (BMI) 30.0-30.9, adult: Secondary | ICD-10-CM | POA: Diagnosis not present

## 2023-10-08 DIAGNOSIS — F419 Anxiety disorder, unspecified: Secondary | ICD-10-CM | POA: Diagnosis not present

## 2023-10-08 DIAGNOSIS — Z79899 Other long term (current) drug therapy: Secondary | ICD-10-CM | POA: Insufficient documentation

## 2023-10-08 DIAGNOSIS — E119 Type 2 diabetes mellitus without complications: Secondary | ICD-10-CM | POA: Diagnosis not present

## 2023-10-08 DIAGNOSIS — Z87891 Personal history of nicotine dependence: Secondary | ICD-10-CM | POA: Diagnosis not present

## 2023-10-08 DIAGNOSIS — E66813 Obesity, class 3: Secondary | ICD-10-CM | POA: Insufficient documentation

## 2023-10-08 DIAGNOSIS — Z87440 Personal history of urinary (tract) infections: Secondary | ICD-10-CM | POA: Insufficient documentation

## 2023-10-08 DIAGNOSIS — N201 Calculus of ureter: Secondary | ICD-10-CM | POA: Insufficient documentation

## 2023-10-08 DIAGNOSIS — N200 Calculus of kidney: Secondary | ICD-10-CM

## 2023-10-08 DIAGNOSIS — N3021 Other chronic cystitis with hematuria: Secondary | ICD-10-CM | POA: Diagnosis not present

## 2023-10-08 DIAGNOSIS — G4730 Sleep apnea, unspecified: Secondary | ICD-10-CM | POA: Insufficient documentation

## 2023-10-08 DIAGNOSIS — D649 Anemia, unspecified: Secondary | ICD-10-CM | POA: Diagnosis not present

## 2023-10-08 DIAGNOSIS — K219 Gastro-esophageal reflux disease without esophagitis: Secondary | ICD-10-CM | POA: Insufficient documentation

## 2023-10-08 DIAGNOSIS — F32A Depression, unspecified: Secondary | ICD-10-CM | POA: Insufficient documentation

## 2023-10-08 DIAGNOSIS — I209 Angina pectoris, unspecified: Secondary | ICD-10-CM | POA: Insufficient documentation

## 2023-10-08 HISTORY — PX: CYSTOSCOPY/URETEROSCOPY/HOLMIUM LASER/STENT PLACEMENT: SHX6546

## 2023-10-08 SURGERY — CYSTOSCOPY/URETEROSCOPY/HOLMIUM LASER/STENT PLACEMENT
Anesthesia: General | Site: Pelvis | Laterality: Left

## 2023-10-08 MED ORDER — FENTANYL CITRATE (PF) 100 MCG/2ML IJ SOLN: 25.0000 ug | INTRAMUSCULAR | Status: DC | PRN

## 2023-10-08 MED ORDER — LACTATED RINGERS IV SOLN: INTRAVENOUS | Status: DC

## 2023-10-08 MED ORDER — MEPERIDINE HCL 25 MG/ML IJ SOLN: 6.2500 mg | INTRAMUSCULAR | Status: DC | PRN

## 2023-10-08 MED ORDER — OXYCODONE HCL 5 MG/5ML PO SOLN: 5.0000 mg | Freq: Once | ORAL | Status: DC | PRN

## 2023-10-08 MED ORDER — MIDAZOLAM HCL 2 MG/2ML IJ SOLN
INTRAMUSCULAR | Status: DC | PRN
  Administered 2023-10-08: 2 mg via INTRAVENOUS

## 2023-10-08 MED ORDER — CHLORHEXIDINE GLUCONATE 0.12 % MT SOLN
15.0000 mL | Freq: Once | OROMUCOSAL | Status: AC
  Administered 2023-10-08: 15 mL via OROMUCOSAL

## 2023-10-08 MED ORDER — KETOROLAC TROMETHAMINE 30 MG/ML IJ SOLN
INTRAMUSCULAR | Status: AC
  Filled 2023-10-08: qty 1

## 2023-10-08 MED ORDER — ONDANSETRON HCL 4 MG/2ML IJ SOLN
INTRAMUSCULAR | Status: DC | PRN
  Administered 2023-10-08: 4 mg via INTRAVENOUS

## 2023-10-08 MED ORDER — DEXAMETHASONE SODIUM PHOSPHATE 10 MG/ML IJ SOLN
INTRAMUSCULAR | Status: AC
  Filled 2023-10-08: qty 1

## 2023-10-08 MED ORDER — DEXAMETHASONE SODIUM PHOSPHATE 10 MG/ML IJ SOLN
INTRAMUSCULAR | Status: DC | PRN
  Administered 2023-10-08: 10 mg via INTRAVENOUS

## 2023-10-08 MED ORDER — PROPOFOL 10 MG/ML IV BOLUS
INTRAVENOUS | Status: AC
  Filled 2023-10-08: qty 20

## 2023-10-08 MED ORDER — ACETAMINOPHEN 500 MG PO TABS
1000.0000 mg | ORAL_TABLET | Freq: Once | ORAL | Status: AC
  Administered 2023-10-08: 1000 mg via ORAL

## 2023-10-08 MED ORDER — CEFAZOLIN SODIUM-DEXTROSE 2-4 GM/100ML-% IV SOLN
2.0000 g | INTRAVENOUS | Status: AC
  Administered 2023-10-08: 2 g via INTRAVENOUS

## 2023-10-08 MED ORDER — SODIUM CHLORIDE 0.9 % IR SOLN
Status: DC | PRN
  Administered 2023-10-08: 3000 mL

## 2023-10-08 MED ORDER — IOHEXOL 300 MG/ML  SOLN
INTRAMUSCULAR | Status: DC | PRN
  Administered 2023-10-08: 10 mL via URETHRAL

## 2023-10-08 MED ORDER — PHENYLEPHRINE HCL-NACL 20-0.9 MG/250ML-% IV SOLN
INTRAVENOUS | Status: DC | PRN
  Administered 2023-10-08: 25 ug/min via INTRAVENOUS

## 2023-10-08 MED ORDER — FENTANYL CITRATE (PF) 250 MCG/5ML IJ SOLN
INTRAMUSCULAR | Status: AC
  Filled 2023-10-08: qty 5

## 2023-10-08 MED ORDER — ONDANSETRON HCL 4 MG/2ML IJ SOLN
INTRAMUSCULAR | Status: AC
  Filled 2023-10-08: qty 2

## 2023-10-08 MED ORDER — 0.9 % SODIUM CHLORIDE (POUR BTL) OPTIME
TOPICAL | Status: DC | PRN
  Administered 2023-10-08: 1000 mL

## 2023-10-08 MED ORDER — ACETAMINOPHEN 500 MG PO TABS
ORAL_TABLET | ORAL | Status: AC
  Filled 2023-10-08: qty 2

## 2023-10-08 MED ORDER — OXYCODONE HCL 5 MG PO TABS: 5.0000 mg | ORAL_TABLET | Freq: Once | ORAL | Status: DC | PRN

## 2023-10-08 MED ORDER — PROPOFOL 10 MG/ML IV BOLUS
INTRAVENOUS | Status: DC | PRN
  Administered 2023-10-08: 150 mg via INTRAVENOUS
  Administered 2023-10-08: 50 mg via INTRAVENOUS

## 2023-10-08 MED ORDER — MIDAZOLAM HCL 2 MG/2ML IJ SOLN
INTRAMUSCULAR | Status: AC
  Filled 2023-10-08: qty 2

## 2023-10-08 MED ORDER — LIDOCAINE 2% (20 MG/ML) 5 ML SYRINGE
INTRAMUSCULAR | Status: DC | PRN
  Administered 2023-10-08: 100 mg via INTRAVENOUS

## 2023-10-08 MED ORDER — ONDANSETRON HCL 4 MG/2ML IJ SOLN: 4.0000 mg | Freq: Once | INTRAMUSCULAR | Status: DC | PRN

## 2023-10-08 MED ORDER — ORAL CARE MOUTH RINSE: 15.0000 mL | Freq: Once | OROMUCOSAL | Status: AC

## 2023-10-08 MED ORDER — CEFAZOLIN SODIUM-DEXTROSE 2-4 GM/100ML-% IV SOLN
INTRAVENOUS | Status: AC
  Filled 2023-10-08: qty 100

## 2023-10-08 MED ORDER — LIDOCAINE 2% (20 MG/ML) 5 ML SYRINGE
INTRAMUSCULAR | Status: AC
  Filled 2023-10-08: qty 5

## 2023-10-08 MED ORDER — PHENYLEPHRINE 80 MCG/ML (10ML) SYRINGE FOR IV PUSH (FOR BLOOD PRESSURE SUPPORT)
PREFILLED_SYRINGE | INTRAVENOUS | Status: DC | PRN
  Administered 2023-10-08 (×10): 80 ug via INTRAVENOUS

## 2023-10-08 MED ORDER — PHENYLEPHRINE 80 MCG/ML (10ML) SYRINGE FOR IV PUSH (FOR BLOOD PRESSURE SUPPORT)
PREFILLED_SYRINGE | INTRAVENOUS | Status: AC
  Filled 2023-10-08: qty 10

## 2023-10-08 MED ORDER — CHLORHEXIDINE GLUCONATE 0.12 % MT SOLN
OROMUCOSAL | Status: AC
  Filled 2023-10-08: qty 15

## 2023-10-08 MED ORDER — KETOROLAC TROMETHAMINE 30 MG/ML IJ SOLN
INTRAMUSCULAR | Status: DC | PRN
  Administered 2023-10-08: 30 mg via INTRAVENOUS

## 2023-10-08 SURGICAL SUPPLY — 22 items
BAG URO CATCHER STRL LF (MISCELLANEOUS) ×1 IMPLANT
BASKET ZERO TIP NITINOL 2.4FR (BASKET) IMPLANT
CATH URETL OPEN 5X70 (CATHETERS) ×1 IMPLANT
DRSG TEGADERM 4X4.75 (GAUZE/BANDAGES/DRESSINGS) IMPLANT
EXTRACTOR STONE 1.7FRX115CM (UROLOGICAL SUPPLIES) IMPLANT
GAUZE SPONGE 4X4 12PLY STRL LF (GAUZE/BANDAGES/DRESSINGS) IMPLANT
GLOVE BIO SURGEON STRL SZ 6.5 (GLOVE) ×1 IMPLANT
GOWN STRL REUS W/ TWL LRG LVL3 (GOWN DISPOSABLE) ×1 IMPLANT
GUIDEWIRE STR DUAL SENSOR (WIRE) ×1 IMPLANT
KIT TURNOVER KIT B (KITS) ×1 IMPLANT
MANIFOLD NEPTUNE II (INSTRUMENTS) ×1 IMPLANT
NS IRRIG 1000ML POUR BTL (IV SOLUTION) IMPLANT
PACK CYSTO (CUSTOM PROCEDURE TRAY) ×1 IMPLANT
SHEATH NAVIGATOR HD 11/13X28 (SHEATH) IMPLANT
SHEATH NAVIGATOR HD 11/13X36 (SHEATH) IMPLANT
SHEATH NAVIGATOR HD 12/14X46 (SHEATH) IMPLANT
SLEEVE SCD COMPRESS KNEE MED (STOCKING) ×1 IMPLANT
SOL .9 NS 3000ML IRR UROMATIC (IV SOLUTION) ×1 IMPLANT
STENT CONTOUR 6FRX24X.038 (STENTS) IMPLANT
TRACTIP FLEXIVA PULSE ID 200 (Laser) IMPLANT
TUBE CONNECTING 12X1/4 (SUCTIONS) ×1 IMPLANT
TUBING UROLOGY SET (TUBING) ×1 IMPLANT

## 2023-10-08 NOTE — Anesthesia Postprocedure Evaluation (Signed)
 Anesthesia Post Note  Patient: Jasmine [name]  Procedure(s) Performed: CYSTOSCOPY/URETEROSCOPY/RETROGRADE PYELOGRAM/HOLMIUM LASER/STENT EXCHANGE (Left: Pelvis)     Patient location during evaluation: PACU Anesthesia Type: General Level of consciousness: awake and alert Pain management: pain level controlled Vital Signs Assessment: post-procedure vital signs reviewed and stable Respiratory status: spontaneous breathing, nonlabored ventilation, respiratory function stable and patient connected to nasal cannula oxygen Cardiovascular status: blood pressure returned to baseline and stable Postop Assessment: no apparent nausea or vomiting Anesthetic complications: no   No notable events documented.  Last Vitals:  Vitals:   10/08/23 1000 10/08/23 1015  BP: (!) 145/88 (!) 152/95  Pulse: 69 71  Resp: (!) 22 14  Temp: 36.4 C   SpO2: 97% 95%    Last Pain:  Vitals:   10/08/23 0945  TempSrc:   PainSc: 0-No pain                 Rodriguez Aguinaldo

## 2023-10-08 NOTE — Interval H&P Note (Signed)
 History and Physical Interval Note: Previously underwent left ureteral stent placement and now returns for definitive removal of stone.  10/08/2023 7:23 AM  Jasmine [name]  has presented today for surgery, with the diagnosis of LEFT RENAL CALCULUS.  The various methods of treatment have been discussed with the patient and family. After consideration of risks, benefits and other options for treatment, the patient has consented to  Procedure(s) with comments: CYSTOSCOPY/URETEROSCOPY/HOLMIUM LASER/STENT PLACEMENT (Left) - CYSTOSCOPY/LEFT URETEROSCOPY/HOLMIUM LASER/STENT PLACEMENT/RETROGRADE PYELOGRAM as a surgical intervention.  The patient's history has been reviewed, patient examined, no change in status, stable for surgery.  I have reviewed the patient's chart and labs.  Questions were answered to the patient's satisfaction.     Darnesha Diloreto D Trevaughn Schear

## 2023-10-08 NOTE — Discharge Instructions (Addendum)
 DISCHARGE INSTRUCTIONS FOR KIDNEY STONE/URETERAL STENT   MEDICATIONS:  1. Resume all your other meds from home  2. AZO over the counter can help with the burning/stinging when you urinate. 3. Tramadol  is for moderate/severe pain, otherwise taking up to 1000 mg every 6 hours of plainTylenol will help treat your pain.   4. Tamsulosin  and hyoscyamine  will help with stent discomfort   ACTIVITY:  1. No strenuous activity x 1week  2. No driving while on narcotic pain medications  3. Drink plenty of water  4. Continue to walk at home - you can still get blood clots when you are at home, so keep active, but don't over do it.  5. May return to work/school tomorrow or when you feel ready   BATHING:  1. You can shower and we recommend daily showers   SIGNS/SYMPTOMS TO CALL:  Please call us  if you have a fever greater than 101.5, uncontrolled nausea/vomiting, uncontrolled pain, dizziness, unable to urinate, bloody urine, chest pain, shortness of breath, leg swelling, leg pain, redness around wound, drainage from wound, or any other concerns or questions.   You can reach us  at [phone].   FOLLOW-UP:  1. Your stent will be removed in the office in approximately 1 week.   Post Anesthesia Home Care Instructions  Activity: Get plenty of rest for the remainder of the day. A responsible individual must stay with you for 24 hours following the procedure.  For the next 24 hours, DO NOT: -Drive a car -Advertising copywriter -Drink alcoholic beverages -Take any medication unless instructed by your physician -Make any legal decisions or sign important papers.  Meals: Start with liquid foods such as gelatin or soup. Progress to regular foods as tolerated. Avoid greasy, spicy, heavy foods. If nausea and/or vomiting occur, drink only clear liquids until the nausea and/or vomiting subsides. Call your physician if vomiting continues.  Special Instructions/Symptoms: Your throat may feel dry or sore from  the anesthesia or the breathing tube placed in your throat during surgery. If this causes discomfort, gargle with warm salt water. The discomfort should disappear within 24 hours.

## 2023-10-08 NOTE — Anesthesia Postprocedure Evaluation (Signed)
 Anesthesia Post Note  Patient: Jasmine [name]  Procedure(s) Performed: CYSTOSCOPY/URETEROSCOPY/RETROGRADE PYELOGRAM/HOLMIUM LASER/STENT EXCHANGE (Left: Pelvis)     Patient location during evaluation: PACU Anesthesia Type: General Level of consciousness: awake and alert Pain management: pain level controlled Vital Signs Assessment: post-procedure vital signs reviewed and stable Respiratory status: spontaneous breathing, nonlabored ventilation, respiratory function stable and patient connected to nasal cannula oxygen Cardiovascular status: blood pressure returned to baseline and stable Postop Assessment: no apparent nausea or vomiting Anesthetic complications: no   No notable events documented.  Last Vitals:  Vitals:   10/08/23 1000 10/08/23 1015  BP: (!) 145/88 (!) 152/95  Pulse: 69 71  Resp: (!) 22 14  Temp:    SpO2: 97% 95%    Last Pain:  Vitals:   10/08/23 0945  TempSrc:   PainSc: 0-No pain                 Andon Villard

## 2023-10-08 NOTE — Transfer of Care (Signed)
 Immediate Anesthesia Transfer of Care Note  Patient: Jasmine [name]  Procedure(s) Performed: CYSTOSCOPY/URETEROSCOPY/RETROGRADE PYELOGRAM/HOLMIUM LASER/STENT EXCHANGE (Left: Pelvis)  Patient Location: PACU  Anesthesia Type:General  Level of Consciousness: awake, patient cooperative, and responds to stimulation  Airway & Oxygen Therapy: Patient Spontanous Breathing and Patient connected to face mask oxygen  Post-op Assessment: Report given to RN, Post -op Vital signs reviewed and stable, and Patient moving all extremities X 4  Post vital signs: Reviewed and stable  Last Vitals:  Vitals Value Taken Time  BP 138/92 10/08/23 0915  Temp    Pulse 69 10/08/23 0919  Resp 14 10/08/23 0920  SpO2 100 % 10/08/23 0919  Vitals shown include unfiled device data.  Last Pain:  Vitals:   10/08/23 0544  TempSrc: Oral  PainSc: 0-No pain      Patients Stated Pain Goal: 5 (10/08/23 0544)  Complications: No notable events documented.

## 2023-10-08 NOTE — Op Note (Signed)
 Preoperative diagnosis: left UPJ calculi  Postoperative diagnosis: left UPJ calculi  Procedure:  Cystoscopy left ureteroscopy, laser lithotripsy, basket stone extraction left 59F x 24 ureteral stent exchange- no string   Surgeon: Perley Bradley, MD  Anesthesia: General  Complications: None  Intraoperative findings:  Normal urethra Bilateral orthotropic ureteral orifices Bladder mucosa normal without masses   EBL: Minimal  Specimens: left renal calculus  Disposition of specimens: [facility] for stone analysis  Indication: Jasmine [name] is a 68 y.o.   patient with a multiple left UPJ calculi who previously underwent left ureteral stent placement and now returns for definitive management of stones.  After reviewing the management options for treatment, the patient elected to proceed with the above surgical procedure(s). We have discussed the potential benefits and risks of the procedure, side effects of the proposed treatment, the likelihood of the patient achieving the goals of the procedure, and any potential problems that might occur during the procedure or recuperation. Informed consent has been obtained.   Description of procedure:  The patient was taken to the operating room and general anesthesia was induced.  The patient was placed in the dorsal lithotomy position, prepped and draped in the usual sterile fashion, and preoperative antibiotics were administered. A preoperative time-out was performed.   Cystourethroscopy was performed.  The patient's urethra was examined and was normal.   The bladder was then systematically examined in its entirety. There was no evidence for any bladder tumors, stones, or other mucosal pathology.    Attention then turned to the left ureteral orifice and graspers were used to bring the existing ureteral stent to the urethral meatus.  A 0.38 sensor wire was then advanced through the ureteral stent up to the kidney and  fluoroscopic guidance.  The stent was removed.  A second sensor wire was placed alongside the first sensor wire with fluoroscopy and secured as a safety wire.  Next ureteral access sheath was placed over the working wire and advanced to the proximal ureter with fluoroscopic guidance.  The inner sheath and wire were removed.  Flexible ureteroscopy then took place.  There was several calculi stacked in the UPJ.     The stones were then fragmented with the 242 micron holmium laser fiber.  The larger stone fragments were then removed with a 0 tip basket.  There were smaller fragments that were too small to remove with the basket. The ureteroscope was removed and using with the access sheath taking care to examine the ureter on the way out.  There was no trauma or stone fragments.  The wire was then backloaded through the cystoscope and a ureteral stent was advance over the wire using Seldinger technique.  The stent was positioned appropriately under fluoroscopic and cystoscopic guidance.  The wire was then removed with an adequate stent curl noted in the renal pelvis as well as in the bladder.  The bladder was then emptied and the procedure ended.  The patient appeared to tolerate the procedure well and without complications.  The patient was able to be awakened and transferred to the recovery unit in satisfactory condition.   Disposition: The stent will stay in place for approximately 1 week.  No tether was left on the stent.  Will be removed in the office with cystoscopy.

## 2023-10-08 NOTE — Anesthesia Procedure Notes (Signed)
 Procedure Name: LMA Insertion Date/Time: 10/08/2023 7:44 AM  Performed by: Dawna Etienne, CRNAPre-anesthesia Checklist: Patient identified, Emergency Drugs available, Suction available and Patient being monitored Patient Re-evaluated:Patient Re-evaluated prior to induction Oxygen Delivery Method: Circle system utilized Preoxygenation: Pre-oxygenation with 100% oxygen Induction Type: IV induction Ventilation: Mask ventilation without difficulty LMA: LMA with gastric port inserted LMA Size: 4.0 Number of attempts: 1 Placement Confirmation: positive ETCO2 and breath sounds checked- equal and bilateral Tube secured with: Tape Dental Injury: Teeth and Oropharynx as per pre-operative assessment

## 2023-10-09 ENCOUNTER — Encounter (HOSPITAL_COMMUNITY): Payer: Self-pay | Admitting: Urology

## 2023-10-17 ENCOUNTER — Other Ambulatory Visit: Payer: Self-pay | Admitting: Urology

## 2023-10-18 ENCOUNTER — Other Ambulatory Visit: Payer: Self-pay | Admitting: Urology

## 2023-10-18 NOTE — H&P (Signed)
 CC/HPI: cc: microscopic hematuria, UTIs, urolithiasis   11/22/2022: 68 year old woman initially seen for microscopic hematuria (evaluation May 2023) underwent left ureteroscopy with laser lithotripsy in February 2024. She comes in today with a few weeks of urinary urgency, frequency but also the feeling that she cannot empty. She feels like she may have a UTI. No hematuria or flank pain. PVR around 90 cc. She was not able to leave a urine sample.   12/05/2022:  Patient presents for 2-week follow-up on UTI. Culture at last visit grew pansensitive Klebsiella pneumonia. Patient has completed her course of Cipro . She states she is not on vaginal Estrace cream. Since last seen, patient states that her symptoms have fully resolved. She is at baseline voiding function. Today, she denies dysuria, gross hematuria, flank pain, fever/chills, nausea/vomiting.   02/08/2023: 68 year old woman with a negative microscopic hematuria evaluation in 2023 and urolithiasis here for follow-up. She had an episode of acute cystitis in August 2024. She is feeling back to baseline now.   03/21/2023: Patient here today with concerns of a possible UTI. She has been having malodorous urine for the past 2 to 3 weeks. This is not associated with any increase in frequency/urgency, changes in force of stream, dysuria or gross hematuria. She denies flank and lower back pain. Patient has not been using topical estrogen that was previously recommended.   05/17/2023 68 year old female who presents today for concerns of malodorous urine, dysuria and suprapubic discomfort. She denies gross hematuria and fevers.   08/09/2023: 68 year old woman with a history of urolithiasis and chronic cystitis here for follow-up. She was last seen in January 2025 for UTI. She has not been using the vaginal estradiol cream lately. She is here with her husband today Josiah Nigh). No flank pain or hematuria.   09/10/2023: 68 year old woman with a history of  urolithiasis and chronic cystitis went to the ED over the weekend for left lower quadrant pain found to have a UTI. CT abdomen pelvis also showed a cluster of stones that has migrated into the renal pelvis. She has previously undergone ureteroscopy for stones in February 2024. She is feeling much better after being treated with antibiotic for UTI. She is going to [place] next week for a little getaway with her husband. She is also been seen by neurology recently.   09/27/2023: 68 year old woman with a history of urolithiasis and chronic cystitis developed acute onset left lower quadrant pain overnight. Patient has multiple stones that have migrated into the renal pelvis on recent imaging. Urinalysis appears infected. She has had some nausea but no vomiting. No fevers or chills.   10/15/2023: 68 year old woman with a history of urolithiasis and UTIs here for follow-up after ureteroscopy last week. KUB today shows residual stone fragments alongside the stent in the distal ureter as well as some fragments up in the kidney. She is tolerating the stent well.     ALLERGIES: No Allergies    MEDICATIONS: Crestor  20 MG Tablet  oxyBUTYnin  Chloride 5 MG Tablet 1 tablet PO BID  Abilify 10 MG Tablet  ALPRAZolam 0.5 MG Tablet tablet PRN  Effexor Xr  Memantine Hcl  Vitamin B12     GU PSH: Cystoscopy - 2023 Locm 300-399Mg /Ml Iodine,1Ml - 2023 Ureteroscopic laser litho, Left - 06/05/2022, Left - 05/22/2022       PSH Notes: Knee Replacement, Wrist Incision, fallopian tubes removed   NON-GU PSH: Revise Knee Joint - 2013 Visit Complexity (formerly GPC1X) - 05/16/2023, 03/21/2023     GU PMH:  Acute Cystitis/UTI - 09/27/2023, - 09/10/2023, - 06/12/2022, - 05/02/2022 Renal calculus (Stable) - 09/27/2023, - 09/10/2023, - 08/09/2023, - 02/08/2023, - 11/22/2022, - 08/03/2022, - 06/12/2022, - 05/30/2022, - 05/02/2022, - 11/06/2021, - 2023 Ureteral calculus - 09/27/2023 Chronic cystitis (with hematuria) - 09/10/2023, -  08/09/2023, - 05/16/2023, - 03/21/2023, - 02/08/2023, - 12/05/2022, - 11/22/2022, - 08/03/2022, - 05/02/2022, - 2023 Microscopic hematuria - 08/09/2023, - 11/06/2021, - 2023, - 2023, - 2023 Renal cyst - 08/09/2023, - 11/06/2021 Urinary Frequency (Stable) - 11/22/2022, Increased urinary frequency, - 2014 Urinary Urgency - 11/22/2022 Ureter, Right, Neoplasm of uncertain behavior - 2023 Mixed incontinence, Urge and stress incontinence - 2014 Urinary incontinence, Unspec, Urinary incontinence - 2014 Weak Urinary Stream, Weak urinary stream - 2014      PMH Notes:  1898-04-30 00:00:00 - Note: Normal Routine History And Physical Adult  2011-11-21 08:35:16 - Note: Arthritis   NON-GU PMH: Pyuria/other UA findings - 03/21/2023 Anxiety, Anxiety (Symptom) - 2014 Personal history of other diseases of the digestive system, History of esophageal reflux - 2014 Personal history of other diseases of the nervous system and sense organs, History of sleep apnea - 2014 Personal history of other mental and behavioral disorders, History of depression - 2014 Depression GERD    FAMILY HISTORY: 1 Daughter - Daughter 1 son - Son copd - Father Death In The Family Father - Runs In Family Death In The Family Mother - Runs In Family Family Health Status Number - Runs In Family multiple myeloma - Mother   SOCIAL HISTORY: Marital Status: Married Preferred Language: English; Ethnicity: Not Hispanic Or Latino; Race: White Current Smoking Status: Patient does not smoke anymore.   Tobacco Use Assessment Completed: Used Tobacco in last 30 days? Has never drank.  Drinks 2 caffeinated drinks per day.     Notes: Former smoker, Marital History - Currently Married, Caffeine Use, Being A Therapist, sports, Occupation:   REVIEW OF SYSTEMS:    GU Review Female:   Patient denies frequent urination, hard to postpone urination, burning /pain with urination, get up at night to urinate, leakage of urine, stream starts and stops, trouble  starting your stream, have to strain to urinate, and being pregnant.  Gastrointestinal (Upper):   Patient denies nausea, vomiting, and indigestion/ heartburn.  Gastrointestinal (Lower):   Patient denies diarrhea and constipation.  Constitutional:   Patient denies fever, night sweats, weight loss, and fatigue.  Skin:   Patient denies itching and skin rash/ lesion.  Eyes:   Patient denies blurred vision and double vision.  Ears/ Nose/ Throat:   Patient denies sore throat and sinus problems.  Hematologic/Lymphatic:   Patient denies swollen glands and easy bruising.  Cardiovascular:   Patient denies leg swelling and chest pains.  Respiratory:   Patient denies cough and shortness of breath.  Endocrine:   Patient denies excessive thirst.  Musculoskeletal:   Patient denies back pain and joint pain.  Neurological:   Patient denies headaches and dizziness.  Psychologic:   Patient denies depression and anxiety.   VITAL SIGNS: None   MULTI-SYSTEM PHYSICAL EXAMINATION:    Constitutional: Well-nourished. No physical deformities. Normally developed. Good grooming.  Neck: Neck symmetrical, not swollen. Normal tracheal position.  Respiratory: No labored breathing, no use of accessory muscles.   Skin: No paleness, no jaundice, no cyanosis. No lesion, no ulcer, no rash.  Neurologic / Psychiatric: Oriented to time, oriented to place, oriented to person. No depression, no anxiety, no agitation.  Eyes: Normal conjunctivae. Normal eyelids.  Ears, Nose,  Mouth, and Throat: Left ear no scars, no lesions, no masses. Right ear no scars, no lesions, no masses. Nose no scars, no lesions, no masses. Normal hearing. Normal lips.  Musculoskeletal: Normal gait and station of head and neck.     Complexity of Data:  Records Review:   Previous Patient Records, POC Tool  Urine Test Review:   Urinalysis  X-Ray Review: KUB: Reviewed Films. Discussed With Patient. Cluster of stone fragments in the distal ureter and scattered  fragments up in the left kidney    PROCEDURES:         KUB - [id number]  A single view of the abdomen is obtained.      . Patient confirmed No Neulasta OnPro Device.           Urinalysis w/Scope Dipstick Dipstick Cont'd Micro  Color: Yellow Bilirubin: Neg mg/dL WBC/hpf: 10 - 21/HYQ  Appearance: Cloudy Ketones: Neg mg/dL RBC/hpf: 10 - 65/HQI  Specific Gravity: 1.015 Blood: 3+ ery/uL Bacteria: Many (>50/hpf)  pH: 6.5 Protein: 1+ mg/dL Cystals: NS (Not Seen)  Glucose: Neg mg/dL Urobilinogen: 0.2 mg/dL Casts: Hyaline    Nitrites: Neg Trichomonas: Not Present    Leukocyte Esterase: 3+ leu/uL Mucous: Present      Epithelial Cells: 0 - 5/hpf      Yeast: NS (Not Seen)      Sperm: Not Present    ASSESSMENT:      ICD-10 Details  1 GU:   Chronic cystitis (with hematuria) - N30.21 Chronic, Stable  2   Renal calculus - N20.0 Chronic, Stable  3   Ureteral calculus - N20.1 Acute, Uncomplicated   PLAN:           Orders Labs CULTURE, URINE  X-Rays: KUB          Schedule         Document Letter(s):  Created for Patient: Clinical Summary         Notes:   Urolithiasis:  - Based on KUB results would like to repeat ureteroscopy to remove fragments from kidney and ureter. Patient is tolerating the stent well and did well with anesthesia. Risks and benefits discussed and are the same as previous procedure.  - Will send urine for culture  - Scheduled for ureteroscopy next week  - Would like to complete 24-hour urine following this procedure

## 2023-10-19 NOTE — Progress Notes (Signed)
 COVID Vaccine received:  []  No [x]  Yes Date of any COVID positive Test in last 90 days:  PCP - Ronal Pinal, MD  Cardiologist - Aleene Passe, MD Neurology-  Modena Callander, MD   Chest x-ray - 11-30-2021 2v  Epic EKG -06-07-2023   Epic  Stress Test -  ECHO -05-04-2022  Epic Cardiac Cath -  CT Coronary Calcium  score: 0 on 12-27-2021  Bowel Prep - [x]  No  []   Yes ______  Pacemaker / ICD device [x]  No []  Yes   Spinal Cord Stimulator:[x]  No []  Yes       History of Sleep Apnea? []  No [x]  Yes   CPAP used?- [x]  No []  Yes    Does the patient monitor blood sugar?   [x]  N/A   []  No []  Yes  Patient has: [x]  NO Hx DM   []  Pre-DM   []  DM1  []   DM2 Last A1c was:   normal 5.5  on  06-05-2022     Blood Thinner / Instructions:  none Aspirin Instructions:  none  ERAS Protocol Ordered: [x]  No  []  Yes Patient is to be NPO after: MN Prior  Dental hx: []  Dentures:  []  N/A      []  Bridge or Partial:                   []  Loose or Damaged teeth:   Comments:   Activity level: Able to walk up 2 flights of stairs without becoming significantly short of breath or having chest pain?  []  No   []    Yes   Anesthesia review: Tachycardia, OSA-no CPAP, GERD, Dementia- memory loss, cerebral vascular disease- Amyloid deposits, Anxiety/ Depression,   Patient denies shortness of breath, fever, cough and chest pain at PAT appointment.  Patient verbalized understanding and agreement to the Pre-Surgical Instructions that were given to them at this PAT appointment. Patient was also educated of the need to review these PAT instructions again prior to her surgery.I reviewed the appropriate phone numbers to call if they have any and questions or concerns.

## 2023-10-19 NOTE — Patient Instructions (Signed)
 SURGICAL WAITING ROOM VISITATION Patients having surgery or a procedure may have no more than 2 support people in the waiting area - these visitors may rotate in the visitor waiting room.   If the patient needs to stay at the hospital during part of their recovery, the visitor guidelines for inpatient rooms apply.  PRE-OP VISITATION  Pre-op nurse will coordinate an appropriate time for 1 support person to accompany the patient in pre-op.  This support person may not rotate.  This visitor will be contacted when the time is appropriate for the visitor to come back in the pre-op area.  Please refer to the [facility] website for the visitor guidelines for Inpatients (after your surgery is over and you are in a regular room).  You are not required to quarantine at this time prior to your surgery. However, you must do this: Hand Hygiene often Do NOT share personal items Notify your provider if you are in close contact with someone who has COVID or you develop fever 100.4 or greater, new onset of sneezing, cough, sore throat, shortness of breath or body aches.  If you test positive for Covid or have been in contact with anyone that has tested positive in the last 10 days please notify you surgeon.    Your procedure is scheduled on:  TUESDAY  October 22, 2023  Report to [facility] Main Entrance: [address] entrance where the [employer] is available.   Report to admitting at: 12:00  noon  Call this number if you have any questions or problems the morning of surgery [phone]  DO NOT EAT OR DRINK ANYTHING AFTER MIDNIGHT THE NIGHT PRIOR TO YOUR SURGERY / PROCEDURE.   FOLLOW  ANY ADDITIONAL PRE OP INSTRUCTIONS YOU RECEIVED FROM YOUR SURGEON'S OFFICE!!!   Oral Hygiene is also important to reduce your risk of infection.        Remember - BRUSH YOUR TEETH THE MORNING OF SURGERY WITH YOUR REGULAR TOOTHPASTE  Do NOT smoke after Midnight the night before surgery.  STOP TAKING all Vitamins,  Herbs and supplements 1 week before your surgery.   Take ONLY these medicines the morning of surgery with A SIP OF WATER: Memantine, aripiprazole, and you may take EITHER Tylenol  OR Tramadol  if needed for pain.                   You may not have any metal on your body including hair pins, jewelry, and body piercing  Do not wear make-up, lotions, powders, perfumes  or deodorant  Do not wear nail polish including gel and S&S, artificial / acrylic nails, or any other type of covering on natural nails including finger and toenails. If you have artificial nails, gel coating, etc., that needs to be removed by a nail salon, Please have this removed prior to surgery. Not doing so may mean that your surgery could be cancelled or delayed if the Surgeon or anesthesia staff feels like they are unable to monitor you safely.   Do not shave 48 hours prior to surgery to avoid nicks in your skin which may contribute to postoperative infections.   Contacts, Hearing Aids, dentures or bridgework may not be worn into surgery. DENTURES WILL BE REMOVED PRIOR TO SURGERY PLEASE DO NOT APPLY Poly grip OR ADHESIVES!!!   Patients discharged on the day of surgery will not be allowed to drive home.  Someone NEEDS to stay with you for the first 24 hours after anesthesia.  Do not bring  your home medications to the hospital. The Pharmacy will dispense medications listed on your medication list to you during your admission in the Hospital.  Please read over the following fact sheets you were given: IF YOU HAVE QUESTIONS ABOUT YOUR PRE-OP INSTRUCTIONS, PLEASE CALL [phone].   [place] - Preparing for Surgery Before surgery, you can play an important role.  Because skin is not sterile, your skin needs to be as free of germs as possible.  You can reduce the number of germs on your skin by washing with CHG (chlorahexidine gluconate) soap before surgery.  CHG is an antiseptic cleaner which kills germs and bonds with the  skin to continue killing germs even after washing. Please DO NOT use if you have an allergy to CHG or antibacterial soaps.  If your skin becomes reddened/irritated stop using the CHG and inform your nurse when you arrive at Short Stay. Do not shave (including legs and underarms) for at least 48 hours prior to the first CHG shower.  You may shave your face/neck.  Please follow these instructions carefully:  1.  Shower with CHG Soap the night before surgery and the  morning of surgery.  2.  If you choose to wash your hair, wash your hair first as usual with your normal  shampoo.  3.  After you shampoo, rinse your hair and body thoroughly to remove the shampoo.                             4.  Use CHG as you would any other liquid soap.  You can apply chg directly to the skin and wash.  Gently with a scrungie or clean washcloth.  5.  Apply the CHG Soap to your body ONLY FROM THE NECK DOWN.   Do not use on face/ open                           Wound or open sores. Avoid contact with eyes, ears mouth and genitals (private parts).                       Wash face,  Genitals (private parts) with your normal soap.             6.  Wash thoroughly, paying special attention to the area where your  surgery  will be performed.  7.  Thoroughly rinse your body with warm water from the neck down.  8.  DO NOT shower/wash with your normal soap after using and rinsing off the CHG Soap.            9.  Pat yourself dry with a clean towel.            10.  Wear clean pajamas.            11.  Place clean sheets on your bed the night of your first shower and do not  sleep with pets.  ON THE DAY OF SURGERY : Do not apply any lotions/deodorants the morning of surgery.  Please wear clean clothes to the hospital/surgery center.    FAILURE TO FOLLOW THESE INSTRUCTIONS MAY RESULT IN THE CANCELLATION OF YOUR SURGERY  PATIENT SIGNATURE_________________________________  NURSE  SIGNATURE__________________________________  ________________________________________________________________________

## 2023-10-21 ENCOUNTER — Encounter (HOSPITAL_COMMUNITY): Payer: Self-pay

## 2023-10-21 ENCOUNTER — Other Ambulatory Visit: Payer: Self-pay

## 2023-10-21 ENCOUNTER — Encounter (HOSPITAL_COMMUNITY)
Admission: RE | Admit: 2023-10-21 | Discharge: 2023-10-21 | Disposition: A | Discharge status: Home / Self Care | Payer: PRIVATE HEALTH INSURANCE | Source: Ambulatory Visit | Attending: Urology | Admitting: Urology

## 2023-10-21 VITALS — BP 100/68 | HR 91 | Temp 98.5°F | Resp 20 | Ht 67.0 in | Wt 202.0 lb

## 2023-10-21 DIAGNOSIS — Z79899 Other long term (current) drug therapy: Secondary | ICD-10-CM | POA: Insufficient documentation

## 2023-10-21 DIAGNOSIS — Z01812 Encounter for preprocedural laboratory examination: Secondary | ICD-10-CM | POA: Diagnosis present

## 2023-10-21 DIAGNOSIS — Z01818 Encounter for other preprocedural examination: Secondary | ICD-10-CM

## 2023-10-21 HISTORY — DX: Cardiac arrhythmia, unspecified: I49.9

## 2023-10-21 LAB — CBC
HCT: 44.9 % (ref 36.0–46.0)
Hemoglobin: 15 g/dL (ref 12.0–15.0)
MCH: 31.6 pg (ref 26.0–34.0)
MCHC: 33.4 g/dL (ref 30.0–36.0)
MCV: 94.5 fL (ref 80.0–100.0)
Platelets: 211 10*3/uL (ref 150–400)
RBC: 4.75 MIL/uL (ref 3.87–5.11)
RDW: 12.3 % (ref 11.5–15.5)
WBC: 6.4 10*3/uL (ref 4.0–10.5)
nRBC: 0 % (ref 0.0–0.2)

## 2023-10-21 LAB — COMPREHENSIVE METABOLIC PANEL WITH GFR
ALT: 17 U/L (ref 0–44)
AST: 16 U/L (ref 15–41)
Albumin: 3.7 g/dL (ref 3.5–5.0)
Alkaline Phosphatase: 73 U/L (ref 38–126)
Anion gap: 11 (ref 5–15)
BUN: 18 mg/dL (ref 8–23)
CO2: 23 mmol/L (ref 22–32)
Calcium: 9.5 mg/dL (ref 8.9–10.3)
Chloride: 106 mmol/L (ref 98–111)
Creatinine, Ser: 1.13 mg/dL — ABNORMAL HIGH (ref 0.44–1.00)
GFR, Estimated: 53 mL/min — ABNORMAL LOW (ref >60)
Glucose, Bld: 115 mg/dL — ABNORMAL HIGH (ref 70–99)
Potassium: 3.2 mmol/L — ABNORMAL LOW (ref 3.5–5.1)
Sodium: 140 mmol/L (ref 135–145)
Total Bilirubin: 1.1 mg/dL (ref 0.0–1.2)
Total Protein: 6.6 g/dL (ref 6.5–8.1)

## 2023-10-22 ENCOUNTER — Ambulatory Visit (HOSPITAL_COMMUNITY)
Admission: RE | Admit: 2023-10-22 | Discharge: 2023-10-22 | Disposition: A | Discharge status: Home / Self Care | Attending: Urology | Admitting: Urology

## 2023-10-22 ENCOUNTER — Other Ambulatory Visit: Payer: Self-pay

## 2023-10-22 ENCOUNTER — Ambulatory Visit (HOSPITAL_COMMUNITY)

## 2023-10-22 ENCOUNTER — Encounter (HOSPITAL_COMMUNITY): Payer: Self-pay | Admitting: Urology

## 2023-10-22 ENCOUNTER — Ambulatory Visit (HOSPITAL_COMMUNITY): Admitting: Anesthesiology

## 2023-10-22 ENCOUNTER — Encounter (HOSPITAL_COMMUNITY)
Admission: RE | Disposition: A | Discharge status: Home / Self Care | Payer: Self-pay | Source: Home / Self Care | Attending: Urology

## 2023-10-22 DIAGNOSIS — G4733 Obstructive sleep apnea (adult) (pediatric): Secondary | ICD-10-CM

## 2023-10-22 DIAGNOSIS — N3021 Other chronic cystitis with hematuria: Secondary | ICD-10-CM | POA: Diagnosis not present

## 2023-10-22 DIAGNOSIS — G4730 Sleep apnea, unspecified: Secondary | ICD-10-CM | POA: Diagnosis not present

## 2023-10-22 DIAGNOSIS — F418 Other specified anxiety disorders: Secondary | ICD-10-CM | POA: Diagnosis not present

## 2023-10-22 DIAGNOSIS — K219 Gastro-esophageal reflux disease without esophagitis: Secondary | ICD-10-CM | POA: Diagnosis not present

## 2023-10-22 DIAGNOSIS — I209 Angina pectoris, unspecified: Secondary | ICD-10-CM | POA: Diagnosis not present

## 2023-10-22 DIAGNOSIS — M1990 Unspecified osteoarthritis, unspecified site: Secondary | ICD-10-CM | POA: Diagnosis not present

## 2023-10-22 DIAGNOSIS — F419 Anxiety disorder, unspecified: Secondary | ICD-10-CM | POA: Insufficient documentation

## 2023-10-22 DIAGNOSIS — F32A Depression, unspecified: Secondary | ICD-10-CM | POA: Insufficient documentation

## 2023-10-22 DIAGNOSIS — Z87891 Personal history of nicotine dependence: Secondary | ICD-10-CM | POA: Diagnosis not present

## 2023-10-22 DIAGNOSIS — N200 Calculus of kidney: Secondary | ICD-10-CM | POA: Insufficient documentation

## 2023-10-22 HISTORY — PX: CYSTOSCOPY/URETEROSCOPY/HOLMIUM LASER/STENT PLACEMENT: SHX6546

## 2023-10-22 SURGERY — CYSTOSCOPY/URETEROSCOPY/HOLMIUM LASER/STENT PLACEMENT
Anesthesia: General | Site: Bladder | Laterality: Left

## 2023-10-22 MED ORDER — ONDANSETRON HCL 4 MG/2ML IJ SOLN
INTRAMUSCULAR | Status: AC
  Filled 2023-10-22: qty 2

## 2023-10-22 MED ORDER — LIDOCAINE HCL (PF) 2 % IJ SOLN
INTRAMUSCULAR | Status: AC
Start: 2023-10-22 — End: 2023-10-22
  Filled 2023-10-22: qty 5

## 2023-10-22 MED ORDER — FENTANYL CITRATE (PF) 100 MCG/2ML IJ SOLN
INTRAMUSCULAR | Status: AC
  Filled 2023-10-22: qty 2

## 2023-10-22 MED ORDER — ONDANSETRON HCL 4 MG/2ML IJ SOLN: 4.0000 mg | Freq: Four times a day (QID) | INTRAMUSCULAR | Status: DC | PRN

## 2023-10-22 MED ORDER — CIPROFLOXACIN IN D5W 400 MG/200ML IV SOLN
400.0000 mg | Freq: Once | INTRAVENOUS | Status: AC
  Administered 2023-10-22: 400 mg via INTRAVENOUS
  Filled 2023-10-22: qty 200

## 2023-10-22 MED ORDER — ORAL CARE MOUTH RINSE: 15.0000 mL | Freq: Once | OROMUCOSAL | Status: AC

## 2023-10-22 MED ORDER — MIDAZOLAM HCL 2 MG/2ML IJ SOLN
INTRAMUSCULAR | Status: AC
  Filled 2023-10-22: qty 2

## 2023-10-22 MED ORDER — DEXAMETHASONE SODIUM PHOSPHATE 10 MG/ML IJ SOLN
INTRAMUSCULAR | Status: DC | PRN
  Administered 2023-10-22: 8 mg via INTRAVENOUS

## 2023-10-22 MED ORDER — CIPROFLOXACIN IN D5W 400 MG/200ML IV SOLN: 400.0000 mg | Freq: Two times a day (BID) | INTRAVENOUS | Status: DC

## 2023-10-22 MED ORDER — LACTATED RINGERS IV SOLN: INTRAVENOUS | Status: DC | PRN

## 2023-10-22 MED ORDER — SODIUM CHLORIDE 0.9 % IR SOLN
Status: DC | PRN
  Administered 2023-10-22: 6000 mL via INTRAVESICAL

## 2023-10-22 MED ORDER — ONDANSETRON HCL 4 MG/2ML IJ SOLN
INTRAMUSCULAR | Status: DC | PRN
  Administered 2023-10-22: 4 mg via INTRAVENOUS

## 2023-10-22 MED ORDER — MIDAZOLAM HCL 5 MG/5ML IJ SOLN
INTRAMUSCULAR | Status: DC | PRN
  Administered 2023-10-22: 2 mg via INTRAVENOUS

## 2023-10-22 MED ORDER — FENTANYL CITRATE PF 50 MCG/ML IJ SOSY: 25.0000 ug | PREFILLED_SYRINGE | INTRAMUSCULAR | Status: DC | PRN

## 2023-10-22 MED ORDER — FENTANYL CITRATE (PF) 100 MCG/2ML IJ SOLN
INTRAMUSCULAR | Status: DC | PRN
  Administered 2023-10-22: 75 ug via INTRAVENOUS
  Administered 2023-10-22: 25 ug via INTRAVENOUS

## 2023-10-22 MED ORDER — OXYCODONE HCL 5 MG PO TABS: 5.0000 mg | ORAL_TABLET | Freq: Once | ORAL | Status: DC | PRN

## 2023-10-22 MED ORDER — OXYCODONE HCL 5 MG/5ML PO SOLN: 5.0000 mg | Freq: Once | ORAL | Status: DC | PRN

## 2023-10-22 MED ORDER — IOHEXOL 300 MG/ML  SOLN
INTRAMUSCULAR | Status: DC | PRN
  Administered 2023-10-22: 10 mL

## 2023-10-22 MED ORDER — LIDOCAINE HCL (PF) 2 % IJ SOLN
INTRAMUSCULAR | Status: DC | PRN
Start: 2023-10-22 — End: 2023-10-22
  Administered 2023-10-22: 60 mg via INTRADERMAL

## 2023-10-22 MED ORDER — LACTATED RINGERS IV SOLN: INTRAVENOUS | Status: DC

## 2023-10-22 MED ORDER — CHLORHEXIDINE GLUCONATE 0.12 % MT SOLN
15.0000 mL | Freq: Once | OROMUCOSAL | Status: AC
  Administered 2023-10-22: 15 mL via OROMUCOSAL

## 2023-10-22 MED ORDER — PROPOFOL 10 MG/ML IV BOLUS
INTRAVENOUS | Status: AC
  Filled 2023-10-22: qty 20

## 2023-10-22 MED ORDER — DEXAMETHASONE SODIUM PHOSPHATE 10 MG/ML IJ SOLN
INTRAMUSCULAR | Status: AC
  Filled 2023-10-22: qty 1

## 2023-10-22 MED ORDER — PROPOFOL 10 MG/ML IV BOLUS
INTRAVENOUS | Status: DC | PRN
  Administered 2023-10-22: 150 mg via INTRAVENOUS

## 2023-10-22 MED ORDER — PHENYLEPHRINE 80 MCG/ML (10ML) SYRINGE FOR IV PUSH (FOR BLOOD PRESSURE SUPPORT)
PREFILLED_SYRINGE | INTRAVENOUS | Status: DC | PRN
  Administered 2023-10-22 (×5): 160 ug via INTRAVENOUS

## 2023-10-22 SURGICAL SUPPLY — 19 items
BAG URO CATCHER STRL LF (MISCELLANEOUS) ×1 IMPLANT
BASKET ZERO TIP NITINOL 2.4FR (BASKET) IMPLANT
CATH URETL OPEN 5X70 (CATHETERS) ×1 IMPLANT
CLOTH BEACON ORANGE TIMEOUT ST (SAFETY) ×1 IMPLANT
DRSG TEGADERM 2-3/8X2-3/4 SM (GAUZE/BANDAGES/DRESSINGS) IMPLANT
FIBER LASER MOSES 200 DFL (Laser) IMPLANT
GLOVE BIO SURGEON STRL SZ 6.5 (GLOVE) ×1 IMPLANT
GOWN STRL REUS W/ TWL LRG LVL3 (GOWN DISPOSABLE) ×1 IMPLANT
GUIDEWIRE STR DUAL SENSOR (WIRE) ×1 IMPLANT
KIT BALLN UROMAX 15FX4 (MISCELLANEOUS) IMPLANT
KIT TURNOVER KIT A (KITS) ×1 IMPLANT
MANIFOLD NEPTUNE II (INSTRUMENTS) ×1 IMPLANT
PACK CYSTO (CUSTOM PROCEDURE TRAY) ×1 IMPLANT
SHEATH NAVIGATOR HD 11/13X28 (SHEATH) IMPLANT
SHEATH NAVIGATOR HD 11/13X36 (SHEATH) IMPLANT
STENT URET 6FRX24 CONTOUR (STENTS) IMPLANT
TRACTIP FLEXIVA PULS ID 200XHI (Laser) IMPLANT
TUBING CONNECTING 10 (TUBING) ×1 IMPLANT
TUBING UROLOGY SET (TUBING) ×1 IMPLANT

## 2023-10-22 NOTE — Anesthesia Procedure Notes (Signed)
 Procedure Name: LMA Insertion Date/Time: 10/22/2023 1:23 PM  Performed by: Deeann Eva BROCKS, CRNAPre-anesthesia Checklist: Patient identified, Emergency Drugs available, Suction available and Patient being monitored Patient Re-evaluated:Patient Re-evaluated prior to induction Oxygen Delivery Method: Circle System Utilized Preoxygenation: Pre-oxygenation with 100% oxygen Induction Type: IV induction Ventilation: Mask ventilation without difficulty LMA: LMA inserted LMA Size: 4.0 Number of attempts: 1 Placement Confirmation: positive ETCO2 Tube secured with: Tape Dental Injury: Teeth and Oropharynx as per pre-operative assessment

## 2023-10-22 NOTE — Discharge Instructions (Addendum)
 DISCHARGE INSTRUCTIONS FOR KIDNEY STONE/URETERAL STENT   MEDICATIONS:  1. Resume all your other meds from home  2. AZO over the counter can help with the burning/stinging when you urinate. 3. Tramadol  is for moderate/severe pain, otherwise taking up to 1000 mg every 6 hours of plainTylenol will help treat your pain.   4. Oxybutynin  can help with overactive bladder   ACTIVITY:  1. No strenuous activity x 1week  2. No driving while on narcotic pain medications  3. Drink plenty of water  4. Continue to walk at home - you can still get blood clots when you are at home, so keep active, but don't over do it.  5. May return to work/school tomorrow or when you feel ready   BATHING:  1. You can shower and we recommend daily showers  2. You have a string coming from your urethra: The stent string is attached to your ureteral stent. Do not pull on this.   SIGNS/SYMPTOMS TO CALL:  Please call us  if you have a fever greater than 101.5, uncontrolled nausea/vomiting, uncontrolled pain, dizziness, unable to urinate, bloody urine, chest pain, shortness of breath, leg swelling, leg pain, redness around wound, drainage from wound, or any other concerns or questions.   You can reach us  at [phone].   FOLLOW-UP:  1. You have a string attached to your stent, you may remove it on Friday, June 27th. To do this, pull the string until the stent is completely removed. You may feel an odd sensation in your back.

## 2023-10-22 NOTE — Transfer of Care (Signed)
 Immediate Anesthesia Transfer of Care Note  Patient: Jasmine [name]  Procedure(s) Performed: CYSTOSCOPY/URETEROSCOPY/STENT PLACEMENT/URETERAL DILATION (Left: Bladder)  Patient Location: PACU  Anesthesia Type:General  Level of Consciousness: drowsy  Airway & Oxygen Therapy: Patient Spontanous Breathing and Patient connected to face mask oxygen  Post-op Assessment: Report given to RN and Post -op Vital signs reviewed and stable  Post vital signs: Reviewed and stable  Last Vitals:  Vitals Value Taken Time  BP 121/76 10/22/23 14:35  Temp    Pulse 69 10/22/23 14:37  Resp 9 10/22/23 14:37  SpO2 99 % 10/22/23 14:37  Vitals shown include unfiled device data.  Last Pain:  Vitals:   10/22/23 1219  TempSrc:   PainSc: 0-No pain         Complications: No notable events documented.

## 2023-10-22 NOTE — Interval H&P Note (Signed)
 History and Physical Interval Note:  10/22/2023 11:51 AM  Jasmine [name]  has presented today for surgery, with the diagnosis of LEFT RENAL CALCULUS.  The various methods of treatment have been discussed with the patient and family. After consideration of risks, benefits and other options for treatment, the patient has consented to  Procedure(s) with comments: CYSTOSCOPY/URETEROSCOPY/HOLMIUM LASER/STENT PLACEMENT (Left) - CYSTOSCOPY/LEFT URETEROSCOPY/HOLMIUM LASER/STENT EXCHANGE as a surgical intervention.  The patient's history has been reviewed, patient examined, no change in status, stable for surgery.  I have reviewed the patient's chart and labs.  Questions were answered to the patient's satisfaction.     Jasmine [name] D Satia Winger

## 2023-10-22 NOTE — Anesthesia Preprocedure Evaluation (Signed)
 Anesthesia Evaluation  Patient identified by MRN, date of birth, ID band Patient awake    Reviewed: Allergy & Precautions, H&P , NPO status , Patient's Chart, lab work & pertinent test results  Airway Mallampati: II   Neck ROM: full    Dental   Pulmonary sleep apnea , former smoker   breath sounds clear to auscultation       Cardiovascular + angina  + dysrhythmias  Rhythm:regular Rate:Normal     Neuro/Psych  PSYCHIATRIC DISORDERS Anxiety Depression       GI/Hepatic ,GERD  ,,  Endo/Other    Renal/GU stones     Musculoskeletal  (+) Arthritis ,    Abdominal   Peds  Hematology   Anesthesia Other Findings   Reproductive/Obstetrics                             Anesthesia Physical Anesthesia Plan  ASA: 3  Anesthesia Plan: General   Post-op Pain Management:    Induction: Intravenous  PONV Risk Score and Plan: 3 and Ondansetron , Dexamethasone  and Treatment may vary due to age or medical condition  Airway Management Planned: LMA  Additional Equipment:   Intra-op Plan:   Post-operative Plan: Extubation in OR  Informed Consent: I have reviewed the patients History and Physical, chart, labs and discussed the procedure including the risks, benefits and alternatives for the proposed anesthesia with the patient or authorized representative who has indicated his/her understanding and acceptance.     Dental advisory given  Plan Discussed with: CRNA, Anesthesiologist and Surgeon  Anesthesia Plan Comments:        Anesthesia Quick Evaluation

## 2023-10-22 NOTE — Op Note (Signed)
 Preoperative diagnosis: left renal calculus  Postoperative diagnosis: left renal calculus  Procedure:  Cystoscopy left ureteroscopy with basket stone extraction left 77F x 24 ureteral stent exchange - with tether Left ureteral balloon dilation  Surgeon: Valli Shank, MD  Anesthesia: General  Complications: None  Intraoperative findings:  Normal urethra Bilateral orthotropic ureteral orifices Bladder mucosa normal without masses   EBL: Minimal  Specimens: left renal calculi  Disposition of specimens: [facility] for stone analysis  Indication: Jasmine [name] is a 68 y.o.   patient with a multiple left UPJ calculi who previously underwent left ureteroscopy with laser lithotripsy and stent placement who now returns for removal of residual stone burden.  After reviewing the management options for treatment, the patient elected to proceed with the above surgical procedure(s). We have discussed the potential benefits and risks of the procedure, side effects of the proposed treatment, the likelihood of the patient achieving the goals of the procedure, and any potential problems that might occur during the procedure or recuperation. Informed consent has been obtained.   Description of procedure:  The patient was taken to the operating room and general anesthesia was induced.  The patient was placed in the dorsal lithotomy position, prepped and draped in the usual sterile fashion, and preoperative antibiotics were administered. A preoperative time-out was performed.   Cystourethroscopy was performed.  The patient's urethra was examined and was normal. The bladder was then systematically examined in its entirety. There was no evidence for any bladder tumors, stones, or other mucosal pathology.    Attention then turned to the left ureteral orifice and graspers were used to bring the existing urethral stent to the urethral meatus.  A 0.38 sensor wire was advanced  through the ureteral stent and up to the kidney with fluoroscopic guidance.  A semirigid ureteroscope was advanced alongside the wire to the proximal ureter.  No stones were encountered and the ureter was noted to be wide open.  A second wire was advanced through the semirigid ureteroscope up to the kidney.  The ureteroscope was removed.  The ureteral access sheath was then advanced over the second wire to the proximal ureter.  The inner sheath and wire were removed.  Flexible ureteroscopy then took place however there was resistance met in the proximal ureter and I was unable to traverse this area.  Attempts at advancing the ureteroscope over a wire were also unsuccessful.  Decision was made to move forward with ureteral balloon dilation.  A 15 French 4 cm urethral balloon dilator was advanced over the working wire so that spanned the narrowed area.  The balloon was inflated to 12 mmHg and held in place for 2 minutes.  It was then deflated.  Flexible ureteroscopy then easily took place to this area.  There is a stone fragment noted to be adhered to the tissue in this area.  It was removed.  Inspection of the calyces revealed multiple stone fragments from previous ureteroscopy.  A 0 tip basket was then used to remove all of the larger stone fragments.  Reinspection of the ureter revealed no remaining visible stones or fragments.   The wire was then backloaded through the cystoscope and a ureteral stent was advance over the wire using Seldinger technique.  The stent was positioned appropriately under fluoroscopic and cystoscopic guidance.  The wire was then removed with an adequate stent curl noted in the renal pelvis as well as in the bladder.  The bladder was then emptied and the procedure  ended.  The patient appeared to tolerate the procedure well and without complications.  The patient was able to be awakened and transferred to the recovery unit in satisfactory condition.   Disposition: The tether of  the stent was left on and tucked inside the patient's vagina.  Instructions for removing the stent have been provided to the patient.

## 2023-10-23 ENCOUNTER — Encounter (HOSPITAL_COMMUNITY): Payer: Self-pay | Admitting: Urology

## 2023-10-24 NOTE — Anesthesia Postprocedure Evaluation (Signed)
 Anesthesia Post Note  Patient: Jasmine [name]  Procedure(s) Performed: CYSTOSCOPY/URETEROSCOPY/STENT PLACEMENT/URETERAL DILATION (Left: Bladder)     Patient location during evaluation: PACU Anesthesia Type: General Level of consciousness: awake and alert Pain management: pain level controlled Vital Signs Assessment: post-procedure vital signs reviewed and stable Respiratory status: spontaneous breathing, nonlabored ventilation, respiratory function stable and patient connected to nasal cannula oxygen Cardiovascular status: blood pressure returned to baseline and stable Postop Assessment: no apparent nausea or vomiting Anesthetic complications: no   No notable events documented.  Last Vitals:  Vitals:   10/22/23 1530 10/22/23 1545  BP: 122/81 127/87  Pulse: 75 71  Resp: 13 14  Temp:  36.4 C  SpO2: 95% 95%    Last Pain:  Vitals:   10/22/23 1545  TempSrc:   PainSc: 0-No pain                 Candus Braud S

## 2023-11-11 ENCOUNTER — Encounter (HOSPITAL_BASED_OUTPATIENT_CLINIC_OR_DEPARTMENT_OTHER): Payer: Self-pay | Admitting: Obstetrics & Gynecology

## 2023-11-12 ENCOUNTER — Ambulatory Visit (INDEPENDENT_AMBULATORY_CARE_PROVIDER_SITE_OTHER): Payer: PRIVATE HEALTH INSURANCE | Admitting: Obstetrics & Gynecology

## 2023-11-12 VITALS — BP 122/76 | HR 103 | Wt 204.4 lb

## 2023-11-12 DIAGNOSIS — Z01419 Encounter for gynecological examination (general) (routine) without abnormal findings: Secondary | ICD-10-CM

## 2023-11-12 DIAGNOSIS — Z780 Asymptomatic menopausal state: Secondary | ICD-10-CM | POA: Diagnosis not present

## 2023-11-12 DIAGNOSIS — N200 Calculus of kidney: Secondary | ICD-10-CM | POA: Diagnosis not present

## 2023-11-12 DIAGNOSIS — Z9079 Acquired absence of other genital organ(s): Secondary | ICD-10-CM | POA: Diagnosis not present

## 2023-11-12 DIAGNOSIS — Z90722 Acquired absence of ovaries, bilateral: Secondary | ICD-10-CM

## 2023-11-12 DIAGNOSIS — R4189 Other symptoms and signs involving cognitive functions and awareness: Secondary | ICD-10-CM

## 2023-11-12 NOTE — Progress Notes (Unsigned)
 Breast and Pelvic Patient name: Jasmine [name] MRN [id number]  Date of birth: Sep 04, 1955 Chief Complaint:   Breast and Pelvic  History of Present Illness:   Jasmine [name] is a 67 y.o. [phone] Caucasian female being seen today for breast and pelvic exam.  She is accompanied by her husband, Jasmine [name], due to pt's memory issues.  She's had renal stones this year with procedures x 2.  Had stent placed.  Pt removed on own.  Spouse would like me to check and make sure this was fully removed.  Pt denies any hematuria or issues with vaginal bleeding.    Patient's last menstrual period was 01/28/2010.   Last pap 09/2022. Results were: negative.  Last mammogram: 2023. Results were: normal. Pt feels she did one last year.  Last colonoscopy: 2019. F/u 7 years.   DEXA:  2022.  Normal.     10/22/2022    3:28 PM 09/28/2021    3:49 PM 09/04/2021    9:49 AM 06/21/2021    1:51 PM 09/20/2020    2:48 PM  Depression screen PHQ 2/9  Decreased Interest 0 0 1 0 0  Down, Depressed, Hopeless 0 1 1 1  0  PHQ - 2 Score 0 1 2 1  0  Altered sleeping   0    Tired, decreased energy   1    Change in appetite   1    Feeling bad or failure about yourself    0    Trouble concentrating   0    Moving slowly or fidgety/restless   0    Suicidal thoughts   0    PHQ-9 Score   4    Difficult doing work/chores   Not difficult at all        Review of Systems:   Pertinent items are noted in HPI Denies any urinary or bowel changes, denies pelvic pain Pertinent History Reviewed:  Reviewed past medical,surgical, social and family history.  Reviewed problem list, medications and allergies. Physical Assessment:   Vitals:   11/12/23 1341  BP: 122/76  Pulse: (!) 103  SpO2: 97%  Weight: 204 lb 6.4 oz (92.7 kg)  Body mass index is 32.01 kg/m.        Physical Examination:   General appearance - well appearing, and in no distress  Mental status - alert, oriented to person, place, and time  Psych:  She has a normal mood  and affect  Skin - warm and dry, normal color, no suspicious lesions noted  Chest - effort normal, all lung fields clear to auscultation bilaterally  Heart - normal rate and regular rhythm  Neck:  midline trachea, no thyromegaly or nodules  Breasts - breasts appear normal, no suspicious masses, no skin or nipple changes or  axillary nodes  Abdomen - soft, nontender, nondistended, no masses or organomegaly  Pelvic - VULVA: normal appearing vulva with no masses, tenderness or lesions.  Stent appears to be fully removed.    VAGINA: atrophic, no lesions  CERVIX: normal appearing cervix without discharge or lesions, no CMT  Thin prep pap is not done   UTERUS: uterus is felt to be normal size, shape, consistency and nontender   ADNEXA: No adnexal masses or tenderness noted.  Rectal - normal rectal, good sphincter tone, no masses felt.   Extremities:  No swelling or varicosities noted  Chaperone present for exam  No results found for this or any previous visit (from the past 24 hours).  Assessment &  Plan:  1. Encntr for gyn exam (general) (routine) w/o abn findings (Primary) - Pap smear 09/2022 - Mammogram 2023 but she feels she did one last year.  Release signed.   - Colonoscopy 2019 with follow up recommended in 7 years.  - Bone mineral density 2022.  Normal.  Repeat if desired in 2027. - vaccines reviewed/updated.  They will check about a few vaccines we reviewed today.  2. Postmenopausal  3. Renal stone - followed by Dr. Elisabeth  4. History of bilateral salpingo-oophorectomy (BSO)  5. Cognitive impairment   Meds: No orders of the defined types were placed in this encounter.   Follow-up: Return for 1-2 years or with new issues/concerns.  Total time with pt and spouse prior to exam was about 22 minutes discussing update to hx and concerns for appointment today.  Ronal GORMAN Pinal, MD 11/15/2023 5:49 PM

## 2023-11-15 ENCOUNTER — Encounter (HOSPITAL_BASED_OUTPATIENT_CLINIC_OR_DEPARTMENT_OTHER): Payer: Self-pay | Admitting: Obstetrics & Gynecology

## 2023-11-19 NOTE — Progress Notes (Unsigned)
 No chief complaint on file.     ASSESSMENT AND PLAN  Jasmine [name] is a 68 y.o. female   Dementia History of severe depression anxiety, had ECT treatment about 20 years ago  Neuropsychology evaluation by Dr.Jenna Renfore showed cognitive impairment suggestive of Alzheimer's disease,  MoCA examination 24/30  MRI of the brain showed mild atrophy, mild to moderate cerebral vascular disease.  Laboratory evaluation showed low B12 226, on supplement, otherwise no treatable etiology,  We discussed, decided to proceed with further evaluation for Alzheimer's pathology such as amyloid PET scan, ATN profile, APOE genetic variant,   Encouraged her moderate exercise,  Introduce Alzheimer's Association website, amyloid removing treatment options,  Return To Clinic With NP In 6 Months   DIAGNOSTIC DATA (LABS, IMAGING, TESTING) - I reviewed patient records, labs, notes, testing and imaging myself where available.   MEDICAL HISTORY:  Jasmine [name] is a 68 year old female, seen in request by her psychiatrist Dr. Tasia, Elna for evaluation of cognitive malfunction, her primary care physician is Dr. Cleotilde, Ronal RAMAN, initial evaluation was on February 4th 2025  History is obtained from the patient and review of electronic medical records. I personally reviewed pertinent available imaging films in PACS.   PMHx of  HLD Depression, on effexor 225mg  since 2024, abilify since 2023,   She has long history of depression anxiety, even received ECT treatment 20 years ago, but over the years, her mood disorder is under excellent control with medications, she retired from her own business of estate sale around age 56  Since 2022, she was noted to have gradual change, loss of interest, no longer social with her friends, difficult to carry on a conversation, was treated by Dr. Tasia over past one year, tried different medications, eventually settled on current Effexor, and Abilify around 2023,  there was some improvement of her mood, she began to go out, interact with people some  But she is far from her baseline, not like to initiate conversation, lost train of the thought in the middle of the conversation, sedentary, watch TV most of the time, her older sister at age 12  suffered severe Alzheimer's disease, required nursing home  She already had neuropsychology evaluation by Dr. 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,  UPDATE Sep 09 2023: She is accompanied by her husband at today's clinical visit, overall doing well, depression is under good control, MoCA examination improved from 19 to 24/30 today, personally reviewed MRI of the brain, mild generalized atrophy, mild to moderate small vessel disease  Extensive laboratory evaluation, low normal B12 226, on supplement, positive ANA, otherwise no treatable etiology,  She has strong family history of Alzheimer's disease, older sister at age 65, was diagnosed of Alzheimer's in her 1s, now at nursing home, need total care,   We discussed the need for more extensive evaluation to further confirm the possible diagnosis of Alzheimer's, decided to proceed with PET scan, APO E genetic variant, and Alzheimer's profile,  We also spent time discussed about current  amyloid removing treatment, potential side effect   Update November 20, 2023 SS:   PHYSICAL  EXAM:   There were no vitals filed for this  visit.  There is no height or weight on file to calculate BMI.  PHYSICAL EXAMNIATION:  Gen: NAD, conversant, well nourised, well groomed                     Cardiovascular: Regular rate rhythm, no peripheral edema, warm, nontender. Eyes: Conjunctivae clear without exudates or hemorrhage Neck: Supple, no carotid bruits. Pulmonary: Clear to auscultation bilaterally   NEUROLOGICAL EXAM:  MENTAL STATUS: Speech/cognition: Rely on her husband to provide history, cooperative on examination    09/09/2023    8:57 AM 06/04/2023    8:25 AM  Montreal Cognitive Assessment   Visuospatial/ Executive (0/5) 5 5  Naming (0/3) 3 3  Attention: Read list of digits (0/2) 2 2  Attention: Read list of letters (0/1) 1 1  Attention: Serial 7 subtraction starting at 100 (0/3) 1 1  Language: Repeat phrase (0/2) 2 1  Language : Fluency (0/1) 0 1  Abstraction (0/2) 2 2  Delayed Recall (0/5) 2 0  Orientation (0/6) 6 3  Total 24 19    CRANIAL NERVES: CN II: Visual fields are full to confrontation. Pupils are round equal and briskly reactive to light. CN III, IV, VI: extraocular movement are normal. No ptosis. CN V: Facial sensation is intact to light touch CN VII: Face is symmetric with normal eye closure  CN VIII: Hearing is normal to causal conversation. CN IX, X: Phonation is normal. CN XI: Head turning and shoulder shrug are intact  MOTOR: There is no pronator drift of out-stretched arms. Muscle bulk and tone are normal. Muscle strength is normal.  REFLEXES: Reflexes are 2+ and symmetric at the biceps, triceps, knees, and ankles. Plantar responses are flexor.  SENSORY: Intact to light touch, pinprick and vibratory sensation are intact in fingers and toes.  COORDINATION: There is no trunk or limb dysmetria noted.  GAIT/STANCE: Posture is normal. Gait is steady  REVIEW OF SYSTEMS:  Full 14 system review of systems performed and notable only for as above All other review of systems were  negative.   ALLERGIES: Allergies  Allergen Reactions   Trintellix [Vortioxetine] Itching and Rash    HOME MEDICATIONS: Current Outpatient Medications  Medication Sig Dispense Refill   acetaminophen  (TYLENOL ) 500 MG tablet Take 500 mg by mouth every 6 (six) hours as needed (for pain.).     ALPRAZolam (XANAX) 0.5 MG tablet Take 0.5-1 mg by mouth at bedtime as needed for anxiety or sleep.     ARIPiprazole (ABILIFY) 2 MG tablet Take 2 mg by mouth daily.     Cyanocobalamin (VITAMIN B 12 PO) Take 1,000 mg by mouth daily.     hyoscyamine  (ANASPAZ ) 0.125 MG TBDP disintergrating tablet Place 1 tablet (0.125 mg total) under the tongue every 6 (six) hours as needed. 30 tablet 0   ibuprofen  (ADVIL ) 200 MG tablet Take 600 mg by mouth every 6 (six) hours as needed (pain.).     memantine (NAMENDA XR) 28 MG CP24 24 hr capsule Take 28 mg by mouth every morning.     rosuvastatin  (CRESTOR ) 20 MG tablet Take 1 tablet (20 mg total) by mouth daily. 90 tablet 3   traMADol  (ULTRAM ) 50 MG tablet Take 1 tablet (50 mg total) by mouth every 6 (six) hours as needed. 20 tablet 0   venlafaxine XR (EFFEXOR-XR) 75 MG 24 hr capsule Take 225 mg by mouth at bedtime.     No current  facility-administered medications for this visit.    PAST MEDICAL HISTORY: Past Medical History:  Diagnosis Date   Anemia    Anginal pain (HCC)    Anxiety    Arthritis    Chest pain    Depression    Diverticulosis    Dysrhythmia    tachycardia   GERD (gastroesophageal reflux disease)    tums only PRN   History of diverticulitis    History of kidney stones    Ovarian cyst    Prediabetes    Recurrent UTI    Sleep apnea    no CPAP    PAST SURGICAL HISTORY: Past Surgical History:  Procedure Laterality Date   BLADDER SUSPENSION N/A 04/24/2013   Procedure: TRANSVAGINAL TAPE (TVT) Exact PROCEDURE;  Surgeon: Robbi JONELLE Render, MD;  Location: [facility];  Service: Gynecology;  Laterality: N/A;   CARPAL TUNNEL RELEASE Bilateral 1997    COLONOSCOPY     CYSTOSCOPY N/A 04/24/2013   Procedure: CYSTOSCOPY;  Surgeon: Robbi JONELLE Render, MD;  Location: [facility];  Service: Gynecology;  Laterality: N/A;   CYSTOSCOPY WITH RETROGRADE PYELOGRAM, URETEROSCOPY AND STENT PLACEMENT Left 05/22/2022   Procedure: CYSTOSCOPY WITH RETROGRADE PYELOGRAM, URETEROSCOPY AND STENT PLACEMENT;  Surgeon: Elisabeth Valli BIRCH, MD;  Location: [facility];  Service: Urology;  Laterality: Left;   CYSTOSCOPY WITH RETROGRADE PYELOGRAM, URETEROSCOPY AND STENT PLACEMENT Left 06/05/2022   Procedure: CYSTOSCOPY WITH RETROGRADE PYELOGRAM, URETEROSCOPY AND STENT EXCHANGE;  Surgeon: Elisabeth Valli BIRCH, MD;  Location: [facility];  Service: Urology;  Laterality: Left;  1 HR   CYSTOSCOPY WITH RETROGRADE PYELOGRAM, URETEROSCOPY AND STENT PLACEMENT Left 09/27/2023   Procedure: CYSTOURETEROSCOPY, WITH RETROGRADE PYELOGRAM AND STENT INSERTION;  Surgeon: Elisabeth Valli BIRCH, MD;  Location: [facility];  Service: Urology;  Laterality: Left;   CYSTOSCOPY/URETEROSCOPY/HOLMIUM LASER/STENT PLACEMENT Left 10/08/2023   Procedure: CYSTOSCOPY/URETEROSCOPY/RETROGRADE PYELOGRAM/HOLMIUM LASER/STENT EXCHANGE;  Surgeon: Elisabeth Valli BIRCH, MD;  Location: [facility];  Service: Urology;  Laterality: Left;  CYSTOSCOPY/LEFT URETEROSCOPY/HOLMIUM LASER/STENT PLACEMENT/RETROGRADE PYELOGRAM   CYSTOSCOPY/URETEROSCOPY/HOLMIUM LASER/STENT PLACEMENT Left 10/22/2023   Procedure: CYSTOSCOPY/URETEROSCOPY/STENT PLACEMENT/URETERAL DILATION;  Surgeon: Elisabeth Valli BIRCH, MD;  Location: [facility];  Service: Urology;  Laterality: Left;  CYSTOSCOPY/LEFT URETEROSCOPY/STENT EXCHANGE/URETERAL DILATION   GIVENS CAPSULE STUDY N/A 10/21/2017   Procedure: GIVENS CAPSULE STUDY;  Surgeon: Legrand Victory LITTIE DOUGLAS, MD;  Location: [facility];  Service: Gastroenterology;  Laterality: N/A;   HOLMIUM LASER APPLICATION Left 05/22/2022   Procedure: MOSES LASER APPLICATION;  Surgeon: Elisabeth Valli BIRCH, MD;  Location: [facility];  Service: Urology;  Laterality: Left;    HOLMIUM LASER APPLICATION Left 06/05/2022   Procedure: HOLMIUM LASER APPLICATION;  Surgeon: Elisabeth Valli BIRCH, MD;  Location: [facility];  Service: Urology;  Laterality: Left;   LAPAROSCOPY Bilateral 05/03/2014   Procedure: LAPAROSCOPIC BILATERAL SALPINGO OOPHORECTOMY;  Surgeon: Ronal Elvie Pinal, MD;  Location: [facility];  Service: Gynecology;  Laterality: Bilateral;   TOTAL KNEE ARTHROPLASTY Bilateral 04/2010    FAMILY HISTORY: Family History  Problem Relation Age of Onset   Multiple myeloma Mother    Early death Mother    Heart attack Father    Alcohol abuse Father    COPD Father    Cancer Maternal Grandmother    Alcohol abuse Maternal Grandfather    Depression Maternal Grandfather    Heart disease Paternal Grandmother    Colon cancer Neg Hx    Stomach cancer Neg Hx    Esophageal cancer Neg Hx    Rectal cancer Neg Hx     SOCIAL HISTORY: Social History  Socioeconomic History   Marital status: Married    Spouse name: Not on file   Number of children: 2   Years of education: Not on file   Highest education level: Not on file  Occupational History   Not on file  Tobacco Use   Smoking status: Former    Current packs/day: 0.00    Types: Cigarettes    Quit date: 10/28/2016    Years since quitting: 7.0   Smokeless tobacco: Never   Tobacco comments:    1/2 PPD  Vaping Use   Vaping status: Never Used  Substance and Sexual Activity   Alcohol use: Not Currently   Drug use: No   Sexual activity: Not Currently    Partners: Male  Other Topics Concern   Not on file  Social History Narrative   Not on file   Social Drivers of Health   Financial Resource Strain: Low Risk  (09/04/2021)   Overall Financial Resource Strain (CARDIA)    Difficulty of Paying Living Expenses: Not hard at all  Food Insecurity: No Food Insecurity (09/04/2021)   Hunger Vital Sign    Worried About Running Out of Food in the Last Year: Never true    Ran Out of Food in the Last Year: Never true   Transportation Needs: No Transportation Needs (09/04/2021)   PRAPARE - Administrator, Civil Service (Medical): No    Lack of Transportation (Non-Medical): No  Physical Activity: Sufficiently Active (09/04/2021)   Exercise Vital Sign    Days of Exercise per Week: 7 days    Minutes of Exercise per Session: 40 min  Stress: No Stress Concern Present (09/04/2021)   [employer] of Occupational Health - Occupational Stress Questionnaire    Feeling of Stress : Not at all  Social Connections: Socially Integrated (09/04/2021)   Social Connection and Isolation Panel    Frequency of Communication with Friends and Family: More than three times a week    Frequency of Social Gatherings with Friends and Family: More than three times a week    Attends Religious Services: More than 4 times per year    Active Member of [employer] or Organizations: Yes    Attends Banker Meetings: More than 4 times per year    Marital Status: Married  Catering manager Violence: Not At Risk (09/04/2021)   Humiliation, Afraid, Rape, and Kick questionnaire    Fear of Current or Ex-Partner: No    Emotionally Abused: No    Physically Abused: No    Sexually Abused: No      Lauraine Born, SCHARLENE, DNP  [facility] [address] [place], [place] [address] [phone]

## 2023-11-20 ENCOUNTER — Ambulatory Visit (INDEPENDENT_AMBULATORY_CARE_PROVIDER_SITE_OTHER): Admitting: Neurology

## 2023-11-20 ENCOUNTER — Encounter: Payer: Self-pay | Admitting: Neurology

## 2023-11-20 VITALS — Ht 67.0 in | Wt 206.8 lb

## 2023-11-20 DIAGNOSIS — F02A0 Dementia in other diseases classified elsewhere, mild, without behavioral disturbance, psychotic disturbance, mood disturbance, and anxiety: Secondary | ICD-10-CM | POA: Diagnosis not present

## 2023-11-20 DIAGNOSIS — F0280 Dementia in other diseases classified elsewhere without behavioral disturbance: Secondary | ICD-10-CM | POA: Insufficient documentation

## 2023-11-20 DIAGNOSIS — G300 Alzheimer's disease with early onset: Secondary | ICD-10-CM | POA: Diagnosis not present

## 2023-11-20 DIAGNOSIS — F32A Depression, unspecified: Secondary | ICD-10-CM | POA: Diagnosis not present

## 2023-11-20 DIAGNOSIS — G309 Alzheimer's disease, unspecified: Secondary | ICD-10-CM | POA: Insufficient documentation

## 2023-11-20 NOTE — Patient Instructions (Signed)
 Nice to meet you both.  Please review both options for treatment including [facility] and [facility].  Please let me know if you have any further questions

## 2023-11-23 ENCOUNTER — Encounter: Payer: Self-pay | Admitting: Neurology

## 2023-11-26 ENCOUNTER — Encounter: Payer: Self-pay | Admitting: Neurology

## 2023-11-27 ENCOUNTER — Telehealth: Admitting: Neurology

## 2023-11-27 DIAGNOSIS — Z8659 Personal history of other mental and behavioral disorders: Secondary | ICD-10-CM

## 2023-11-27 DIAGNOSIS — G300 Alzheimer's disease with early onset: Secondary | ICD-10-CM | POA: Diagnosis not present

## 2023-11-27 DIAGNOSIS — F02A0 Dementia in other diseases classified elsewhere, mild, without behavioral disturbance, psychotic disturbance, mood disturbance, and anxiety: Secondary | ICD-10-CM | POA: Diagnosis not present

## 2023-11-27 NOTE — Progress Notes (Addendum)
 Virtual Visit via Video Note  I connected with Avantika Parks Lycan on 11/27/23 at  8:15 AM EDT by a video enabled telemedicine application and verified that I am speaking with the correct person using two identifiers.  Location: Patient: at home Provider: in the office    I discussed the limitations of evaluation and management by telemedicine and the availability of in person appointments. The patient expressed understanding and agreed to proceed.  ASSESSMENT AND PLAN  Jasmine [name] is a 68 y.o. female   Alzheimer's Disease  History of severe depression anxiety, had ECT treatment about 20 years ago  Neuropsychology evaluation by Dr.Jenna Renfore showed cognitive impairment suggestive of Alzheimer's disease,  MoCA examination 24/30   MRI of the brain showed mild atrophy, mild to moderate cerebral vascular disease.  Laboratory evaluation showed low B12 226, on supplement, otherwise no treatable etiology A-T+N-, E3/E4 Amyloid PET positive  On Namenda We extensively reviewed Leqembi and Kisunla, I sent them information on my chart.  Specifically risk of ARIA with Leqembi as heterozygote 19% versus placebo vs Kisunla 36% vs 13% placebo. As well, as with either drug, TPA is contraindication if needed for acute stroke event. They are going to review information and follow up with their decision.   Patient Information about Martie and Celedonio (taken from clinical drug websites)  Leqembi(Lecanemab) and Kisunla(Donanemab) are both indicated for the treatment of Alzheimer's disease and are monoclonal antibodies directed against amyloid beta plaques.  There are no head to head trials comparing both medications. The trials measured different endpoints.   Removal of plaque appears strong in both; while slightly different mechanisms of action both clear plaques and appear to have an effect on other biomarkers.  Ongoing data analysis supports that anti-amyloid therapies provide modest clinical  benefit.  There is no head-to-head data to compare these two therapies any comparisons are not definitive especially with effectiveness given different trial designs, different populations.  Leqembi targets protofibrils (small chains of linked up beta-amyloid proteins), preventing them to form, thus preventing amyloid plaques from developing. Kisunla targets beta amyloid plaques.   Leqembi   Leqembi looked at CDR (Clinical Dementia Rating) which, is the gold standard for AD staging and progression. Leqembi slowed progression 0-18 months by 27%. 95% of patients chose to extend their treatment out to 3 years. Of patients taking Leqembi 16% were APOE E4 homozygotes (E4/E4), 53% were heterozygotes (E3/E4), 31% noncarriers.   With Leqembi, ARIA was higher in ApoE ?4 homozygotes (Leqembi: 45%; placebo: 22%) than in heterozygotes (LEQEMBI: 19%; placebo: 9%) and noncarriers (Leqembi: 13%; placebo: 4%). Symptomatic ARIA-E occurred in 9% of ApoE ?4 homozygotes vs 2% of heterozygotes and 1% of noncarriers. Serious ARIA events occurred in 3% of ApoE ?4 homozygotes and in ~1% of heterozygotes and noncarriers. The recommendations on management of ARIA do not differ between ApoE ?4 carriers and noncarriers.   The most common adverse reactions reported in >=5% with Leqembi and >=2% higher than placebo were IRRs (Leqembi: 26%; placebo: 7%), ARIA-H (Leqembi: 14%; placebo: 8%), ARIA-E (Leqembi: 13%; placebo: 2%), headache (Leqembi: 11%; placebo: 8%), superficial siderosis of central nervous system (Leqembi: 6%; placebo: 3%), rash (Leqembi: 6%; placebo: 4%), and nausea/vomiting (Leqembi: 6%; placebo: 4%).  Kisunla  Kisunla measured iADRS. 37% reduced risk of progressing to the next stage of disease vs placebo through 76 weeks   The risk of ARIA, including symptomatic and serious ARIA, is increased in apolipoprotein E ?4 (ApoE ?4) homozygotes. 17% (143/850) of patients in the  Kisunla arm were ApoE ?4 homozygotes, 53%  (452/850) were heterozygotes, and 30% (255/850) were noncarriers. The incidence of ARIA was higher in ApoE ?4 homozygotes (Kisunla: 55%; placebo: 22%) than in heterozygotes (Kisunla: 36%; placebo: 13%) and noncarriers (Kisunla: 25%; placebo: 12%). Among patients treated with Kisunla, symptomatic ARIA-E occurred in 8% of ApoE ?4 homozygotes compared with 7% of heterozygotes and 4% of noncarriers. Serious events of ARIA occurred in 3% of ApoE ?4 homozygotes, 2% of heterozygotes, and 1% of noncarriers.  Celedonio demonstrated reduction in amyloid plaques as early as 6 months. 61% average amyloid plaque reduction at 6 months, average 80% amyloid plaque reduction at 12 months, average 84% amyloid plaque reduction at 18 months. (Amyloid Plaque Reduction from Baseline in Overall Population in TRAILBLAZER-ALZ 2)  After 18 months in a clinical study, people treated with Celedonio showed a significant slowing of decline of 22%, on average, compared with placebo.  Adverse Reactions: The most common adverse reactions reported in >=5% of patients treated with Kisunla (n=853) and >=2% higher than placebo (n=874): ARIA-H microhemorrhage (25% vs 11%), ARIA-E (24% vs 2%), ARIA-H superficial siderosis (15% vs 3%), headache (13% vs 10%), IRRs (9% vs 0.5%).  Monitoring for ARIA Obtain a recent baseline brain magnetic resonance imaging (MRI) prior to initiating treatment with KISUNLA. Obtain an MRI prior to the 2nd, 3rd, 4th, and 7th infusions. If a patient experiences symptoms suggestive of ARIA, clinical evaluation should be performed, including an MRI if indicated. It's possible to stop taking Kisunla as early as 6 months Your doctor may consider stopping your Celedonio treatment if your amyloid plaques are reduced to a minimal level In a clinical study, 17% of people were able to stop taking Kisunla at 6 months, 47% at 12 months, and 69% at 18 months if their amyloid plaques were reduced to a predefined level. PET scans were  used to determine amyloid levels in the study. People who were able to stop treatment were switched to placebo Key differences between the medications:  Leqembi: - Every 2 weeks, new maintenance dosing after 18 months monthly  - Is weight based  - Dosing is indefinite - Memory testing score 22-30 - The studies did not repeat CT amyloid PET - Primary endpoints were different, primary endpoint of this medication was the Clinical Dementia Rating Scale at 18 months which showed decrease of 27% progression compared to placebo - Different mechanism, stops plaques from forming, beginning to form and prevents further aggregation, may be more effective in earlier stages of disease - Increased risk of ARIA for homozygote E4   Kisunla:  - Monthly - Set dose regardless of weight - Memory score testing 18-28 (includes lower memory score than Leqembi) - Patients may be able to stop infusions as studies did show overall a high percentage met criteria to stop at 12 months visually by PET/CT.  However no guidelines on how soon toxic species that are unmeasured bounce back after cessation of treatment and what are the markers of disease/treatment monitoring  - Primary endpoint was Integrated Alzheimer's Disease rating scale at 18 months which is more comprehensive as it test both cognitive and functional aspects of Alzheimer's disease and showed approximately 35% decrease in cognitive decline as compared to placebo - Different mechanism interrupts plaques/aggregated more mature form of plaques so may be more beneficial in later stages of disease - Significantly increased risk of ARIA as compared to Leqembi especially for E4 homozygotes.   - Given their increased risk of ARIA there is more  intense MRI monitoring  Limited data are available to guide individual-level decisions regarding thrombolysis, but dictate vigilant scrutiny for ARIA as the potential cause of stroke-like symptoms. Risk of thrombolysis-related  ICH might be stratified in the acute care setting by an individual's likelihood of ARIA as determined by factors such as recency of immunotherapy initiation, APOE genotype, and baseline CAA markers (Table 2). Review of recent monitoring MRIs and performance of immediate MRI are reasonable approaches to identifying the presence of ARIA and its possible presentation as an acute stroke mimic. Pending further data demonstrating the safety of thrombolysis in this setting, treating clinicians might reasonably consider a high level of caution or avoidance of thrombolytic use in patients receiving immunotherapy. Mechanical thrombectomy without thrombolytics for patients with established clinical indications is likely safe and should be performed. American Heart Association Dec 2024  Follow up in 6 months with Dr. Onita   DIAGNOSTIC DATA (LABS, IMAGING, TESTING) - I reviewed patient records, labs, notes, testing and imaging myself where available.   MEDICAL HISTORY:  Skylah Delauter is a 68 year old female, seen in request by her psychiatrist Dr. Tasia, Elna for evaluation of cognitive malfunction, her primary care physician is Dr. Cleotilde, Ronal RAMAN, initial evaluation was on February 4th 2025  History is obtained from the patient and review of electronic medical records. I personally reviewed pertinent available imaging films in PACS.   PMHx of  HLD Depression, on effexor 225mg  since 2024, abilify since 2023,   She has long history of depression anxiety, even received ECT treatment 20 years ago, but over the years, her mood disorder is under excellent control with medications, she retired from her own business of estate sale around age 29  Since 2022, she was noted to have gradual change, loss of interest, no longer social with her friends, difficult to carry on a conversation, was treated by Dr. Tasia over past one year, tried different medications, eventually settled on current Effexor, and Abilify  around 2023, there was some improvement of her mood, she began to go out, interact with people some  But she is far from her baseline, not like to initiate conversation, lost train of the thought in the middle of the conversation, sedentary, watch TV most of the time, her older sister at age 41  suffered severe Alzheimer's disease, required nursing home  She already had neuropsychology evaluation by Dr. 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,  UPDATE Sep 09 2023: She is accompanied by her husband at today's clinical visit, overall doing well, depression is under good control, MoCA examination improved from 19 to 24/30 today, personally reviewed MRI of the brain, mild generalized atrophy, mild to moderate small vessel disease  Extensive laboratory evaluation, low normal B12 226, on supplement, positive ANA, otherwise no treatable etiology,  She has strong family history of Alzheimer's disease, older sister at age 92, was diagnosed of Alzheimer's in her 75s, now at nursing home, need total care,   We discussed the need for more extensive evaluation to further confirm the possible diagnosis of Alzheimer's, decided to proceed with PET scan, APO E genetic variant, and Alzheimer's  profile,  We also spent time discussed about current  amyloid removing treatment, potential side effect   Update November 20, 2023 SS: MOCA 24/30. Amyloid PET scan was positive, A-T+N-,  E3/E4. Has trouble with people, places, things.  Is sedentary, not very active.  No longer driving.  Would like to discuss Leqembi or Kisunla infusion. She has sister with AD at 105 in nursing home full care.   Update November 27, 2023 SS: Here to discuss anti-amyloid medication options.   PHYSICAL EXAM:   There were no vitals filed for this visit.      11/20/2023    3:17 PM 09/09/2023    8:57 AM 06/04/2023    8:25 AM  Montreal Cognitive Assessment   Visuospatial/ Executive (0/5) 5 5 5   Naming (0/3) 3 3 3   Attention: Read list of digits (0/2) 2 2 2   Attention: Read list of letters (0/1) 1 1 1   Attention: Serial 7 subtraction starting at 100 (0/3) 1 1 1   Language: Repeat phrase (0/2) 2 2 1   Language : Fluency (0/1) 1 0 1  Abstraction (0/2) 2 2 2   Delayed Recall (0/5) 2 2 0  Orientation (0/6) 4 6 3   Total 23 24 19   Adjusted Score (based on education) 24      Physical Exam  General: The patient is alert and cooperative at the time of the examination. Husband provides most information.   REVIEW OF SYSTEMS:  Full 14 system review of systems performed and notable only for as above All other review of systems were negative.   ALLERGIES: Allergies  Allergen Reactions   Trintellix [Vortioxetine] Itching and Rash    HOME MEDICATIONS: Current Outpatient Medications  Medication Sig Dispense Refill   acetaminophen  (TYLENOL ) 500 MG tablet Take 500 mg by mouth every 6 (six) hours as needed (for pain.).     ALPRAZolam (XANAX) 0.5 MG tablet Take 0.5-1 mg by mouth at bedtime as needed for anxiety or sleep.     ARIPiprazole (ABILIFY) 2 MG tablet Take 2 mg by mouth daily.     Cyanocobalamin (VITAMIN B 12 PO) Take 1,000 mg by mouth daily.     hyoscyamine  (ANASPAZ ) 0.125 MG TBDP disintergrating tablet Place 1 tablet (0.125 mg total) under the tongue every 6 (six) hours as needed. 30 tablet 0   ibuprofen  (ADVIL ) 200 MG tablet Take 600 mg by mouth every 6 (six) hours as needed (pain.).      memantine (NAMENDA XR) 28 MG CP24 24 hr capsule Take 28 mg by mouth every morning.     rosuvastatin  (CRESTOR ) 20 MG tablet Take 1 tablet (20 mg total) by mouth daily. 90 tablet 3   traMADol  (ULTRAM ) 50 MG tablet Take 1 tablet (50 mg total) by mouth every 6 (six) hours as needed. 20 tablet 0   venlafaxine XR (EFFEXOR-XR) 75 MG 24 hr capsule Take 225 mg by mouth at bedtime.     No current facility-administered medications for this visit.    PAST MEDICAL HISTORY: Past Medical History:  Diagnosis Date   Anemia    Anginal pain (HCC)    Anxiety    Arthritis    Chest pain    Depression    Diverticulosis    Dysrhythmia    tachycardia   GERD (gastroesophageal reflux disease)    tums only PRN   History of diverticulitis    History of kidney stones    Ovarian cyst    Prediabetes    Recurrent UTI  Sleep apnea    no CPAP    PAST SURGICAL HISTORY: Past Surgical History:  Procedure Laterality Date   BLADDER SUSPENSION N/A 04/24/2013   Procedure: TRANSVAGINAL TAPE (TVT) Exact PROCEDURE;  Surgeon: Robbi JONELLE Render, MD;  Location: [facility];  Service: Gynecology;  Laterality: N/A;   CARPAL TUNNEL RELEASE Bilateral 1997   COLONOSCOPY     CYSTOSCOPY N/A 04/24/2013   Procedure: CYSTOSCOPY;  Surgeon: Robbi JONELLE Render, MD;  Location: [facility];  Service: Gynecology;  Laterality: N/A;   CYSTOSCOPY WITH RETROGRADE PYELOGRAM, URETEROSCOPY AND STENT PLACEMENT Left 05/22/2022   Procedure: CYSTOSCOPY WITH RETROGRADE PYELOGRAM, URETEROSCOPY AND STENT PLACEMENT;  Surgeon: Elisabeth Valli BIRCH, MD;  Location: [facility];  Service: Urology;  Laterality: Left;   CYSTOSCOPY WITH RETROGRADE PYELOGRAM, URETEROSCOPY AND STENT PLACEMENT Left 06/05/2022   Procedure: CYSTOSCOPY WITH RETROGRADE PYELOGRAM, URETEROSCOPY AND STENT EXCHANGE;  Surgeon: Elisabeth Valli BIRCH, MD;  Location: [facility];  Service: Urology;  Laterality: Left;  1 HR   CYSTOSCOPY WITH RETROGRADE PYELOGRAM, URETEROSCOPY AND STENT PLACEMENT Left 09/27/2023    Procedure: CYSTOURETEROSCOPY, WITH RETROGRADE PYELOGRAM AND STENT INSERTION;  Surgeon: Elisabeth Valli BIRCH, MD;  Location: [facility];  Service: Urology;  Laterality: Left;   CYSTOSCOPY/URETEROSCOPY/HOLMIUM LASER/STENT PLACEMENT Left 10/08/2023   Procedure: CYSTOSCOPY/URETEROSCOPY/RETROGRADE PYELOGRAM/HOLMIUM LASER/STENT EXCHANGE;  Surgeon: Elisabeth Valli BIRCH, MD;  Location: [facility];  Service: Urology;  Laterality: Left;  CYSTOSCOPY/LEFT URETEROSCOPY/HOLMIUM LASER/STENT PLACEMENT/RETROGRADE PYELOGRAM   CYSTOSCOPY/URETEROSCOPY/HOLMIUM LASER/STENT PLACEMENT Left 10/22/2023   Procedure: CYSTOSCOPY/URETEROSCOPY/STENT PLACEMENT/URETERAL DILATION;  Surgeon: Elisabeth Valli BIRCH, MD;  Location: [facility];  Service: Urology;  Laterality: Left;  CYSTOSCOPY/LEFT URETEROSCOPY/STENT EXCHANGE/URETERAL DILATION   GIVENS CAPSULE STUDY N/A 10/21/2017   Procedure: GIVENS CAPSULE STUDY;  Surgeon: Legrand Victory LITTIE DOUGLAS, MD;  Location: [facility];  Service: Gastroenterology;  Laterality: N/A;   HOLMIUM LASER APPLICATION Left 05/22/2022   Procedure: MOSES LASER APPLICATION;  Surgeon: Elisabeth Valli BIRCH, MD;  Location: [facility];  Service: Urology;  Laterality: Left;   HOLMIUM LASER APPLICATION Left 06/05/2022   Procedure: HOLMIUM LASER APPLICATION;  Surgeon: Elisabeth Valli BIRCH, MD;  Location: [facility];  Service: Urology;  Laterality: Left;   LAPAROSCOPY Bilateral 05/03/2014   Procedure: LAPAROSCOPIC BILATERAL SALPINGO OOPHORECTOMY;  Surgeon: Ronal Elvie Pinal, MD;  Location: [facility];  Service: Gynecology;  Laterality: Bilateral;   TOTAL KNEE ARTHROPLASTY Bilateral 04/2010    FAMILY HISTORY: Family History  Problem Relation Age of Onset   Multiple myeloma Mother    Early death Mother    Heart attack Father    Alcohol abuse Father    COPD Father    Alzheimer's disease Sister    Cancer Maternal Grandmother    Alcohol abuse Maternal Grandfather    Depression Maternal Grandfather    Heart disease Paternal Grandmother    Colon cancer  Neg Hx    Stomach cancer Neg Hx    Esophageal cancer Neg Hx    Rectal cancer Neg Hx     SOCIAL HISTORY: Social History   Socioeconomic History   Marital status: Married    Spouse name: Not on file   Number of children: 2   Years of education: Not on file   Highest education level: Not on file  Occupational History   Not on file  Tobacco Use   Smoking status: Former    Current packs/day: 0.00    Types: Cigarettes    Quit date: 10/28/2016    Years since quitting: 7.0   Smokeless tobacco: Never   Tobacco comments:  1/2 PPD  Vaping Use   Vaping status: Never Used  Substance and Sexual Activity   Alcohol use: Not Currently   Drug use: No   Sexual activity: Not Currently    Partners: Male  Other Topics Concern   Not on file  Social History Narrative   Not on file   Social Drivers of Health   Financial Resource Strain: Low Risk  (09/04/2021)   Overall Financial Resource Strain (CARDIA)    Difficulty of Paying Living Expenses: Not hard at all  Food Insecurity: No Food Insecurity (09/04/2021)   Hunger Vital Sign    Worried About Running Out of Food in the Last Year: Never true    Ran Out of Food in the Last Year: Never true  Transportation Needs: No Transportation Needs (09/04/2021)   PRAPARE - Administrator, Civil Service (Medical): No    Lack of Transportation (Non-Medical): No  Physical Activity: Sufficiently Active (09/04/2021)   Exercise Vital Sign    Days of Exercise per Week: 7 days    Minutes of Exercise per Session: 40 min  Stress: No Stress Concern Present (09/04/2021)   [employer] of Occupational Health - Occupational Stress Questionnaire    Feeling of Stress : Not at all  Social Connections: Socially Integrated (09/04/2021)   Social Connection and Isolation Panel    Frequency of Communication with Friends and Family: More than three times a week    Frequency of Social Gatherings with Friends and Family: More than three times a week    Attends  Religious Services: More than 4 times per year    Active Member of [employer] or Organizations: Yes    Attends Banker Meetings: More than 4 times per year    Marital Status: Married  Catering manager Violence: Not At Risk (09/04/2021)   Humiliation, Afraid, Rape, and Kick questionnaire    Fear of Current or Ex-Partner: No    Emotionally Abused: No    Physically Abused: No    Sexually Abused: No   Lauraine Born, SCHARLENE, DNP  [facility] [address] [place], [place] [address] [phone]

## 2023-11-27 NOTE — Patient Instructions (Signed)
 Nice to speak with you both!  Please let me know if you have any further questions!!

## 2023-12-06 NOTE — Progress Notes (Signed)
 Chart reviewed, agree above plan ?

## 2023-12-08 ENCOUNTER — Encounter: Payer: Self-pay | Admitting: Neurology

## 2023-12-10 NOTE — Telephone Encounter (Signed)
 Completed paperwork to start lequembi awaiting on Dr. Onita to sign in sarah's absence

## 2023-12-23 NOTE — Telephone Encounter (Signed)
 Message sent to the infusion team to awaiting to find out where there are in the process. Will leave this in our inbasket until we find out the status.

## 2024-01-01 ENCOUNTER — Ambulatory Visit: Payer: PRIVATE HEALTH INSURANCE | Admitting: Psychology

## 2024-01-23 ENCOUNTER — Encounter (HOSPITAL_BASED_OUTPATIENT_CLINIC_OR_DEPARTMENT_OTHER): Payer: Self-pay | Admitting: Obstetrics & Gynecology

## 2024-01-28 ENCOUNTER — Encounter (HOSPITAL_BASED_OUTPATIENT_CLINIC_OR_DEPARTMENT_OTHER): Payer: Self-pay | Admitting: Obstetrics & Gynecology

## 2024-01-29 ENCOUNTER — Telehealth: Payer: Self-pay

## 2024-01-29 DIAGNOSIS — G309 Alzheimer's disease, unspecified: Secondary | ICD-10-CM

## 2024-01-29 NOTE — Telephone Encounter (Signed)
 No auth required sent to [facility] to schedule by 10/15. [phone]

## 2024-01-29 NOTE — Telephone Encounter (Signed)
 Had 2nd infusion of Leqembi today. Order for MRI brain placed. Will need before 3rd treatment on 10/15.  Orders Placed This Encounter  Procedures   MR BRAIN WO CONTRAST

## 2024-01-29 NOTE — Telephone Encounter (Signed)
 Received teams message from Ronald in [facility]:

## 2024-02-04 ENCOUNTER — Ambulatory Visit (HOSPITAL_COMMUNITY)
Admission: RE | Admit: 2024-02-04 | Discharge: 2024-02-04 | Disposition: A | Discharge status: Home / Self Care | Source: Ambulatory Visit | Attending: Neurology | Admitting: Neurology

## 2024-02-04 DIAGNOSIS — G309 Alzheimer's disease, unspecified: Secondary | ICD-10-CM | POA: Insufficient documentation

## 2024-02-05 ENCOUNTER — Ambulatory Visit: Payer: Self-pay | Admitting: Neurology

## 2024-02-26 ENCOUNTER — Telehealth: Payer: Self-pay | Admitting: *Deleted

## 2024-02-26 DIAGNOSIS — G309 Alzheimer's disease, unspecified: Secondary | ICD-10-CM

## 2024-02-26 NOTE — Telephone Encounter (Signed)
 MRI order sent to [facility] [phone]

## 2024-02-26 NOTE — Telephone Encounter (Signed)
 Lauraine- can you place MRI order? Thank you  Per Holly/[facility]: Day Vinsant [birth year] Comes today at 09:30am for her 4th infusion and will need an MRI before her 5th infusion on 11/12

## 2024-03-05 ENCOUNTER — Ambulatory Visit (HOSPITAL_COMMUNITY)
Admission: RE | Admit: 2024-03-05 | Discharge: 2024-03-05 | Disposition: A | Discharge status: Home / Self Care | Payer: PRIVATE HEALTH INSURANCE | Source: Ambulatory Visit | Attending: Neurology | Admitting: Neurology

## 2024-03-05 DIAGNOSIS — G309 Alzheimer's disease, unspecified: Secondary | ICD-10-CM | POA: Insufficient documentation

## 2024-03-09 ENCOUNTER — Telehealth: Payer: Self-pay | Admitting: *Deleted

## 2024-03-10 ENCOUNTER — Ambulatory Visit: Admitting: Neurology

## 2024-03-10 ENCOUNTER — Ambulatory Visit: Payer: Self-pay | Admitting: Neurology

## 2024-03-10 NOTE — Telephone Encounter (Signed)
 Dr. Onita, can you take a look at MRI brain. On Leqembi. 5th infusion scheduled for tomorrow 11/12. Of note, mentioned ARIA H micro hemorrhage 1.  IMPRESSION: ARIA related:   Overall: stable   ARIA-E: none   ARIA-H Microhemorrhage: 1   ARIA-H Superficial Siderosis: none   Additional findings: No acute intracranial abnormality. No substantial change additional chronic findings since 02/04/2024.

## 2024-03-10 NOTE — Telephone Encounter (Signed)
 ARIA-H Microhemorrhage Prior Microhemorrhage: 1 New:  none Description/comments: Right superior frontal gyrus (series 7, image 26), faintly appreciable going back to 07/05/2023  Per our discussion, asymptomatic, mild microhemorrhage, that was present in previous imaging, may continue infusion

## 2024-03-25 ENCOUNTER — Telehealth: Payer: Self-pay | Admitting: *Deleted

## 2024-03-25 DIAGNOSIS — G309 Alzheimer's disease, unspecified: Secondary | ICD-10-CM

## 2024-03-25 NOTE — Telephone Encounter (Signed)
Orders Placed This Encounter  Procedures   MR BRAIN WO CONTRAST      

## 2024-03-25 NOTE — Telephone Encounter (Signed)
 Dr. Onita- can you place order? You are primary MD and SS,NP out. Pt on Leqembi.   Per Holly/[facility]:

## 2024-03-30 NOTE — Telephone Encounter (Signed)
 I let [facility] know that MRI brain has been ordered.

## 2024-03-31 ENCOUNTER — Ambulatory Visit (HOSPITAL_COMMUNITY): Admission: RE | Admit: 2024-03-31 | Discharge: 2024-03-31 | Attending: Neurology | Admitting: Neurology

## 2024-03-31 DIAGNOSIS — G309 Alzheimer's disease, unspecified: Secondary | ICD-10-CM | POA: Insufficient documentation

## 2024-04-07 ENCOUNTER — Ambulatory Visit: Payer: Self-pay | Admitting: Neurology

## 2024-05-01 ENCOUNTER — Encounter: Payer: Self-pay | Admitting: Neurology

## 2024-05-12 ENCOUNTER — Telehealth: Payer: Self-pay | Admitting: Neurology

## 2024-05-12 ENCOUNTER — Encounter: Payer: Self-pay | Admitting: Neurology

## 2024-05-12 ENCOUNTER — Ambulatory Visit: Admitting: Neurology

## 2024-05-12 VITALS — BP 107/73 | HR 93 | Ht 67.0 in | Wt 206.0 lb

## 2024-05-12 DIAGNOSIS — G309 Alzheimer's disease, unspecified: Secondary | ICD-10-CM

## 2024-05-12 DIAGNOSIS — F32A Depression, unspecified: Secondary | ICD-10-CM | POA: Diagnosis not present

## 2024-05-12 NOTE — Progress Notes (Signed)
 "  Chief Complaint  Patient presents with   Follow-up    Pt in room 14. Husband in room.Here for memory follow up. MOCA:20      ASSESSMENT AND PLAN  Jasmine [name] is a 69 y.o. female   Dementia History of severe depression anxiety, had ECT treatment about 20 years ago  Neuropsychology evaluation by Dr.Jenna Renfore showed cognitive impairment suggestive of Alzheimer's disease,  MoCA examination 24/30  MRI of the brain showed mild atrophy, mild to moderate cerebral vascular disease.  Laboratory evaluation showed low B12 226, on supplement, otherwise no treatable etiology,  Started Leqembi infusion since September 2025, tolerating it well, repeat MRI according to protocol showed no evidence of ARIA  Encouraged her moderate exercise,  She has follow up visit in July, will consider repeat PET scan for amyloid load then    Return To Clinic With NP In 6 Months   DIAGNOSTIC DATA (LABS, IMAGING, TESTING) - I reviewed patient records, labs, notes, testing and imaging myself where available.   MEDICAL HISTORY:  Jasmine [name] is a 69 year old female, seen in request by her psychiatrist Dr. Tasia, Elna for evaluation of cognitive malfunction, her primary care physician is Dr. Cleotilde, Ronal RAMAN, initial evaluation was on February 4th 2025  History is obtained from the patient and review of electronic medical records. I personally reviewed pertinent available imaging films in PACS.   PMHx of  HLD Depression, on effexor 225mg  since 2024, abilify since 2023,   She has long history of depression anxiety, even received ECT treatment 20 years ago, but over the years, her mood disorder is under excellent control with medications, she retired from her own business of estate sale around age 68  Since 2022, she was noted to have gradual change, loss of interest, no longer social with her friends, difficult to carry on a conversation, was treated by Dr. Tasia over past one year, tried  different medications, eventually settled on current Effexor, and Abilify around 2023, there was some improvement of her mood, she began to go out, interact with people some  But she is far from her baseline, not like to initiate conversation, lost train of the thought in the middle of the conversation, sedentary, watch TV most of the time, her older sister at age 19  suffered severe Alzheimer's disease, required nursing home  She already had neuropsychology evaluation by Dr. 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,  UPDATE Sep 09 2023: She is accompanied by her husband at today's clinical visit, overall doing well, depression is under good control, MoCA examination improved from 19 to 24/30 today, personally reviewed MRI of the brain, mild generalized atrophy, mild to moderate small vessel disease  Extensive laboratory evaluation, low normal B12 226, on supplement, positive ANA, otherwise no treatable etiology,  She has strong family history of Alzheimer's disease, older sister at age 35, was diagnosed of Alzheimer's in her 73s, now at nursing home, need total care,   We discussed the need for more extensive evaluation to further confirm the possible diagnosis of Alzheimer's, decided to proceed with PET scan, APO E genetic variant,  and Alzheimer's profile,  We also spent time discussed about current  amyloid removing treatment, potential side effect   UPDATE  May 12 2024: 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, laboratory evaluation showed no treatable etiology, A-T+N-, Apo E3/E4  Patient started Leqembi infusion January 12, 2024, tolerated well, no significant side effect, repeat MRI showed no significant ARIA Last infusion was on May 13, 2024, She sleeps well, fair appetite, sedentary, still goes out with friends couple times a week, but barely initiate conversation Her husband sent long MyChart message prior to visit, related questions were answered, including the need for presenilen genetic testing, reviewed literature, it is not recommended, if do want to proceed, needs genetic counseling first  PHYSICAL EXAM:   Vitals:   05/12/24 1127  BP: 107/73  Pulse: 93  Weight: 206 lb (93.4 kg)  Height: 5' 7 (1.702 m)   Body mass index is 32.26 kg/m.  PHYSICAL EXAMNIATION:  Gen: NAD, conversant, well nourised, well groomed                     Cardiovascular: Regular rate rhythm, no peripheral edema, warm, nontender. Eyes: Conjunctivae clear without exudates or hemorrhage Neck: Supple, no carotid bruits. Pulmonary: Clear to auscultation bilaterally   NEUROLOGICAL EXAM:  MENTAL STATUS: Speech/cognition: Rely on her husband to provide history, cooperative on examination    05/12/2024   11:30 AM 11/20/2023    3:17 PM 09/09/2023    8:57 AM 06/04/2023    8:25 AM  Montreal Cognitive Assessment   Visuospatial/ Executive (0/5) 4 5 5 5   Naming (0/3) 3 3 3 3   Attention: Read list of digits (0/2) 2 2 2 2   Attention: Read list of letters (0/1) 1 1 1 1   Attention: Serial 7 subtraction starting at 100 (0/3) 1 1 1 1   Language: Repeat phrase (0/2) 2 2 2 1   Language : Fluency (0/1) 0 1 0 1  Abstraction (0/2) 2 2 2 2   Delayed Recall (0/5) 0 2 2 0  Orientation (0/6) 5 4 6 3   Total 20 23 24 19   Adjusted Score (based on education)  24      CRANIAL NERVES: CN II: Visual fields are full to confrontation. Pupils are round equal and briskly  reactive to light. CN III, IV, VI: extraocular movement are normal. No ptosis. CN V: Facial sensation is intact to light touch CN VII: Face is symmetric with normal eye closure  CN VIII: Hearing is normal to causal conversation. CN IX, X: Phonation is normal. CN XI: Head turning and shoulder shrug are intact  MOTOR: There is no pronator drift of out-stretched arms. Muscle bulk and tone are normal. Muscle strength is normal.  REFLEXES: Reflexes are 2+ and symmetric at the biceps, triceps, knees, and ankles. Plantar responses are flexor.  SENSORY: Intact to light touch, pinprick and vibratory sensation are intact in fingers and toes.  COORDINATION: There is no trunk or limb dysmetria noted.  GAIT/STANCE: Posture is normal. Gait is steady  REVIEW OF SYSTEMS:  Full 14 system review of systems performed and notable only for as above All other review of systems were negative.   ALLERGIES: Allergies  Allergen Reactions   Trintellix [Vortioxetine] Itching and Rash    HOME MEDICATIONS: Current Outpatient Medications  Medication Sig Dispense Refill   acetaminophen  (TYLENOL ) 500 MG tablet Take 500 mg by mouth every 6 (six) hours as needed (for pain.).     ALPRAZolam (XANAX) 0.5 MG tablet Take  0.5-1 mg by mouth at bedtime as needed for anxiety or sleep.     ARIPiprazole (ABILIFY) 2 MG tablet Take 2 mg by mouth daily.     Cyanocobalamin (VITAMIN B 12 PO) Take 1,000 mg by mouth daily.     hyoscyamine  (ANASPAZ ) 0.125 MG TBDP disintergrating tablet Place 1 tablet (0.125 mg total) under the tongue every 6 (six) hours as needed. 30 tablet 0   ibuprofen  (ADVIL ) 200 MG tablet Take 600 mg by mouth every 6 (six) hours as needed (pain.).     memantine (NAMENDA XR) 28 MG CP24 24 hr capsule Take 28 mg by mouth every morning.     rosuvastatin  (CRESTOR ) 20 MG tablet Take 1 tablet (20 mg total) by mouth daily. 90 tablet 3   traMADol  (ULTRAM ) 50 MG tablet Take 1 tablet (50 mg total) by mouth every 6  (six) hours as needed. 20 tablet 0   venlafaxine XR (EFFEXOR-XR) 75 MG 24 hr capsule Take 225 mg by mouth at bedtime.     No current facility-administered medications for this visit.    PAST MEDICAL HISTORY: Past Medical History:  Diagnosis Date   Anemia    Anginal pain    Anxiety    Arthritis    Chest pain    Depression    Diverticulosis    Dysrhythmia    tachycardia   GERD (gastroesophageal reflux disease)    tums only PRN   History of diverticulitis    History of kidney stones    Ovarian cyst    Prediabetes    Recurrent UTI    Sleep apnea    no CPAP    PAST SURGICAL HISTORY: Past Surgical History:  Procedure Laterality Date   BLADDER SUSPENSION N/A 04/24/2013   Procedure: TRANSVAGINAL TAPE (TVT) Exact PROCEDURE;  Surgeon: Robbi JONELLE Render, MD;  Location: [facility];  Service: Gynecology;  Laterality: N/A;   CARPAL TUNNEL RELEASE Bilateral 1997   COLONOSCOPY     CYSTOSCOPY N/A 04/24/2013   Procedure: CYSTOSCOPY;  Surgeon: Robbi JONELLE Render, MD;  Location: [facility];  Service: Gynecology;  Laterality: N/A;   CYSTOSCOPY WITH RETROGRADE PYELOGRAM, URETEROSCOPY AND STENT PLACEMENT Left 05/22/2022   Procedure: CYSTOSCOPY WITH RETROGRADE PYELOGRAM, URETEROSCOPY AND STENT PLACEMENT;  Surgeon: Elisabeth Valli BIRCH, MD;  Location: [facility];  Service: Urology;  Laterality: Left;   CYSTOSCOPY WITH RETROGRADE PYELOGRAM, URETEROSCOPY AND STENT PLACEMENT Left 06/05/2022   Procedure: CYSTOSCOPY WITH RETROGRADE PYELOGRAM, URETEROSCOPY AND STENT EXCHANGE;  Surgeon: Elisabeth Valli BIRCH, MD;  Location: [facility];  Service: Urology;  Laterality: Left;  1 HR   CYSTOSCOPY WITH RETROGRADE PYELOGRAM, URETEROSCOPY AND STENT PLACEMENT Left 09/27/2023   Procedure: CYSTOURETEROSCOPY, WITH RETROGRADE PYELOGRAM AND STENT INSERTION;  Surgeon: Elisabeth Valli BIRCH, MD;  Location: [facility];  Service: Urology;  Laterality: Left;   CYSTOSCOPY/URETEROSCOPY/HOLMIUM LASER/STENT PLACEMENT Left 10/08/2023   Procedure:  CYSTOSCOPY/URETEROSCOPY/RETROGRADE PYELOGRAM/HOLMIUM LASER/STENT EXCHANGE;  Surgeon: Elisabeth Valli BIRCH, MD;  Location: [facility];  Service: Urology;  Laterality: Left;  CYSTOSCOPY/LEFT URETEROSCOPY/HOLMIUM LASER/STENT PLACEMENT/RETROGRADE PYELOGRAM   CYSTOSCOPY/URETEROSCOPY/HOLMIUM LASER/STENT PLACEMENT Left 10/22/2023   Procedure: CYSTOSCOPY/URETEROSCOPY/STENT PLACEMENT/URETERAL DILATION;  Surgeon: Elisabeth Valli BIRCH, MD;  Location: [facility];  Service: Urology;  Laterality: Left;  CYSTOSCOPY/LEFT URETEROSCOPY/STENT EXCHANGE/URETERAL DILATION   GIVENS CAPSULE STUDY N/A 10/21/2017   Procedure: GIVENS CAPSULE STUDY;  Surgeon: Legrand Victory LITTIE DOUGLAS, MD;  Location: [facility];  Service: Gastroenterology;  Laterality: N/A;   HOLMIUM LASER APPLICATION Left 05/22/2022   Procedure: MOSES LASER APPLICATION;  Surgeon: Elisabeth Valli BIRCH, MD;  Location: [facility];  Service: Urology;  Laterality: Left;   HOLMIUM LASER APPLICATION Left 06/05/2022   Procedure: HOLMIUM LASER APPLICATION;  Surgeon: Elisabeth Valli BIRCH, MD;  Location: [facility];  Service: Urology;  Laterality: Left;   LAPAROSCOPY Bilateral 05/03/2014   Procedure: LAPAROSCOPIC BILATERAL SALPINGO OOPHORECTOMY;  Surgeon: Ronal Elvie Pinal, MD;  Location: [facility];  Service: Gynecology;  Laterality: Bilateral;   TOTAL KNEE ARTHROPLASTY Bilateral 04/2010    FAMILY HISTORY: Family History  Problem Relation Age of Onset   Multiple myeloma Mother    Early death Mother    Heart attack Father    Alcohol abuse Father    COPD Father    Alzheimer's disease Sister    Cancer Maternal Grandmother    Alcohol abuse Maternal Grandfather    Depression Maternal Grandfather    Heart disease Paternal Grandmother    Colon cancer Neg Hx    Stomach cancer Neg Hx    Esophageal cancer Neg Hx    Rectal cancer Neg Hx     SOCIAL HISTORY: Social History   Socioeconomic History   Marital status: Married    Spouse name: Not on file   Number of children: 2   Years of education:  Not on file   Highest education level: Not on file  Occupational History   Not on file  Tobacco Use   Smoking status: Former    Current packs/day: 0.00    Average packs/day: 0.5 packs/day    Types: Cigarettes    Quit date: 10/28/2016    Years since quitting: 7.5   Smokeless tobacco: Never   Tobacco comments:    1/2 PPD  Vaping Use   Vaping status: Never Used  Substance and Sexual Activity   Alcohol use: Not Currently   Drug use: No   Sexual activity: Not Currently    Partners: Male  Other Topics Concern   Not on file  Social History Narrative   Not on file   Social Drivers of Health   Tobacco Use: Medium Risk (05/12/2024)   Patient History    Smoking Tobacco Use: Former    Smokeless Tobacco Use: Never    Passive Exposure: Not on file  Financial Resource Strain: Low Risk (09/04/2021)   Overall Financial Resource Strain (CARDIA)    Difficulty of Paying Living Expenses: Not hard at all  Food Insecurity: No Food Insecurity (09/04/2021)   Hunger Vital Sign    Worried About Running Out of Food in the Last Year: Never true    Ran Out of Food in the Last Year: Never true  Transportation Needs: No Transportation Needs (09/04/2021)   PRAPARE - Administrator, Civil Service (Medical): No    Lack of Transportation (Non-Medical): No  Physical Activity: Sufficiently Active (09/04/2021)   Exercise Vital Sign    Days of Exercise per Week: 7 days    Minutes of Exercise per Session: 40 min  Stress: No Stress Concern Present (09/04/2021)   [employer] of Occupational Health - Occupational Stress Questionnaire    Feeling of Stress : Not at all  Social Connections: Socially Integrated (09/04/2021)   Social Connection and Isolation Panel    Frequency of Communication with Friends and Family: More than three times a week    Frequency of Social Gatherings with Friends and Family: More than three times a week    Attends Religious Services: More than 4 times per year    Active Member  of [employer] or Organizations: Yes  Attends Banker Meetings: More than 4 times per year    Marital Status: Married  Catering Manager Violence: Not At Risk (09/04/2021)   Humiliation, Afraid, Rape, and Kick questionnaire    Fear of Current or Ex-Partner: No    Emotionally Abused: No    Physically Abused: No    Sexually Abused: No  Depression (PHQ2-9): Low Risk (10/22/2022)   Depression (PHQ2-9)    PHQ-2 Score: 0  Alcohol Screen: Low Risk (09/04/2021)   Alcohol Screen    Last Alcohol Screening Score (AUDIT): 0  Housing: Low Risk (09/04/2021)   Housing    Last Housing Risk Score: 0  Utilities: Not on file  Health Literacy: Not on file      Modena Callander, M.D. Ph.D.  [facility] [address] [place], [place] [address] Ph: [phone] Fax: [phone]  CC:  Cleotilde Ronal RAMAN, MD [address] [place],  [place] [address]  Cleotilde Ronal RAMAN, MD    Total time spent reviewing the chart, obtaining history, examined patient, ordering tests, documentation, consultations and family, care coordination was  40 minutes   "

## 2024-05-12 NOTE — Telephone Encounter (Signed)
 Reviewed FDA package insert and Open Evidence recommendation  LEQEMBI reduced amyloid beta plaque in a dose- and time-dependent manner in the dose-ranging study (Study 1) and in a time-dependent manner in single-dosing regimen study (Study 2) compared with placebo [see  Clinical Studies (14)].   In Study 1, treatment with LEQEMBI 10 mg/kg every two weeks reduced amyloid beta plaque levels in the  brain, producing reductions in PET SUVR compared to placebo at both Weeks 53 and 79 (p<0.0001). The  magnitude of the reduction was time- and dose-dependent.   During an off-treatment period in Study 1 (range from 9 to 59 months; mean of 24 months), SUVR and  Centiloid values began to increase with a mean rate of increase of 2.6 Centiloids/year, however, treatment  difference relative to placebo at the end of the double-blind, placebo-controlled period in Study 1 was  maintained.  In Study 2, treatment with LEQEMBI 10 mg/kg every two weeks reduced amyloid beta plaque levels in the  brain, producing reductions compared to placebo starting at Week 13 and continuing through Week 79  (p<0.0001).   An increase in plasma A?42/40 ratio (Table 6) and CSF A?[1-42] was observed with LEQEMBI 10 mg/kg  every two weeks dosing compared to placebo.  Amyloid PET scans are not routinely required for ongoing monitoring of response to lecanemab in clinical practice; their primary use is to confirm amyloid pathology prior to treatment initiation. [1-2] Unlike MRI surveillance for ARIA, serial amyloid PET imaging is not part of standard safety or efficacy monitoring protocols.  In clinical trials, amyloid PET was performed at baseline and at 18 months (Week 79) to assess amyloid reduction, with significant decreases observed by 13 weeks and continuing through 79 weeks of treatment

## 2024-05-18 ENCOUNTER — Telehealth: Payer: Self-pay | Admitting: Neurology

## 2024-05-18 NOTE — Telephone Encounter (Signed)
 Mr and Mrs. Brodgen:   I have reviewed literature and Leqembi package insert   There is no established protocol for routine amyloid PET scanning to monitor treatment response during Leqembi (lecanemab) therapy, as the FDA prescribing information does not specify timing for repeat amyloid PET scans.     The FDA label for lecanemab specifies obtaining safety MRI scans before the 5th, 7th, and 14th infusions to monitor for amyloid-related imaging abnormalities (ARIA), but does not mandate amyloid PET imaging for treatment monitoring.  MRI is the required imaging modality for safety surveillance, as only MRI can detect ARIA--amyloid PET cannot assess for this complication.   However, amyloid PET may be considered in specific clinical scenarios. In clinical trials, after 79 weeks (approximately 18 months) of treatment, 67% of patients achieved amyloid levels below 30 Centiloids. The [employer] of Radiology states that amyloid PET is appropriate to monitor response in patients receiving approved amyloid-targeting therapy, though this recommendation is based primarily on clinical trial data rather than established clinical practice.   If amyloid PET demonstrates reduction to normal levels (below approximately 30 Centiloids), this may support decisions regarding treatment discontinuation or transition to maintenance dosing.   The decision to obtain amyloid PET for treatment monitoring should be individualized, particularly when considering whether to continue, pause, or transition to maintenance therapy after the initial 97-month treatment period.   I would suggest repeat amyloid PET scan after at least 12 months treatment, if she can tolerate the treatment well, we can consider PET scan after initial 18 months treatment.   We can have further discussion at her next follow up visit.

## 2024-11-09 ENCOUNTER — Ambulatory Visit: Admitting: Neurology
